# Patient Record
Sex: Male | Born: 1950 | Race: Black or African American | Hispanic: No | Marital: Married | Smoking: Never smoker
Health system: Southern US, Community
[De-identification: ages and names within clinical notes are randomized; demographics above are authoritative.]

## PROBLEM LIST (undated history)

## (undated) DIAGNOSIS — K5790 Diverticulosis of intestine, part unspecified, without perforation or abscess without bleeding: Secondary | ICD-10-CM

## (undated) DIAGNOSIS — R972 Elevated prostate specific antigen [PSA]: Secondary | ICD-10-CM

## (undated) DIAGNOSIS — D126 Benign neoplasm of colon, unspecified: Secondary | ICD-10-CM

## (undated) DIAGNOSIS — E785 Hyperlipidemia, unspecified: Secondary | ICD-10-CM

## (undated) DIAGNOSIS — G4730 Sleep apnea, unspecified: Secondary | ICD-10-CM

## (undated) DIAGNOSIS — K648 Other hemorrhoids: Secondary | ICD-10-CM

## (undated) DIAGNOSIS — N189 Chronic kidney disease, unspecified: Secondary | ICD-10-CM

## (undated) DIAGNOSIS — E119 Type 2 diabetes mellitus without complications: Secondary | ICD-10-CM

## (undated) DIAGNOSIS — I10 Essential (primary) hypertension: Secondary | ICD-10-CM

## (undated) HISTORY — PX: PROSTATE BIOPSY: SHX241

## (undated) HISTORY — DX: Essential (primary) hypertension: I10

## (undated) HISTORY — DX: Diverticulosis of intestine, part unspecified, without perforation or abscess without bleeding: K57.90

## (undated) HISTORY — DX: Benign neoplasm of colon, unspecified: D12.6

## (undated) HISTORY — PX: POLYPECTOMY: SHX149

## (undated) HISTORY — DX: Other hemorrhoids: K64.8

## (undated) HISTORY — DX: Type 2 diabetes mellitus without complications: E11.9

## (undated) HISTORY — DX: Hyperlipidemia, unspecified: E78.5

## (undated) HISTORY — PX: COLONOSCOPY: SHX174

## (undated) HISTORY — PX: OTHER SURGICAL HISTORY: SHX169

---

## 2007-06-12 ENCOUNTER — Ambulatory Visit: Payer: Self-pay | Admitting: Gastroenterology

## 2007-06-28 ENCOUNTER — Encounter: Payer: Self-pay | Admitting: Gastroenterology

## 2007-06-28 ENCOUNTER — Ambulatory Visit: Payer: Self-pay | Admitting: Gastroenterology

## 2007-06-30 ENCOUNTER — Encounter: Payer: Self-pay | Admitting: Gastroenterology

## 2010-07-22 ENCOUNTER — Encounter: Payer: Self-pay | Admitting: Gastroenterology

## 2010-07-29 ENCOUNTER — Ambulatory Visit (AMBULATORY_SURGERY_CENTER): Payer: 59 | Admitting: *Deleted

## 2010-07-29 VITALS — Ht 66.0 in | Wt 218.0 lb

## 2010-07-29 DIAGNOSIS — Z1211 Encounter for screening for malignant neoplasm of colon: Secondary | ICD-10-CM

## 2010-07-29 MED ORDER — PEG-KCL-NACL-NASULF-NA ASC-C 100 G PO SOLR: ORAL | Status: DC

## 2010-07-30 ENCOUNTER — Encounter: Payer: Self-pay | Admitting: Gastroenterology

## 2010-08-10 ENCOUNTER — Encounter: Payer: Self-pay | Admitting: Gastroenterology

## 2010-08-10 ENCOUNTER — Ambulatory Visit (AMBULATORY_SURGERY_CENTER): Payer: 59 | Admitting: Gastroenterology

## 2010-08-10 DIAGNOSIS — K5730 Diverticulosis of large intestine without perforation or abscess without bleeding: Secondary | ICD-10-CM

## 2010-08-10 DIAGNOSIS — Z1211 Encounter for screening for malignant neoplasm of colon: Secondary | ICD-10-CM

## 2010-08-10 DIAGNOSIS — Z86010 Personal history of colonic polyps: Secondary | ICD-10-CM

## 2010-08-10 MED ORDER — SODIUM CHLORIDE 0.9 % IV SOLN: 500.0000 mL | INTRAVENOUS | Status: DC

## 2010-08-10 NOTE — Patient Instructions (Signed)
Resume all medications. Information given on diverticulosis and high fiber diet.D/C Information completed.

## 2010-08-11 ENCOUNTER — Telehealth: Payer: Self-pay

## 2010-08-11 NOTE — Telephone Encounter (Signed)

## 2012-04-24 ENCOUNTER — Other Ambulatory Visit: Payer: Self-pay | Admitting: *Deleted

## 2012-04-24 ENCOUNTER — Encounter: Payer: Self-pay | Admitting: *Deleted

## 2012-04-24 DIAGNOSIS — I10 Essential (primary) hypertension: Secondary | ICD-10-CM

## 2012-04-24 NOTE — Telephone Encounter (Signed)
LAST OV 10/13 

## 2012-04-25 MED ORDER — AMLODIPINE BESYLATE 5 MG PO TABS: 5.0000 mg | ORAL_TABLET | Freq: Every day | ORAL | Status: DC

## 2012-04-25 NOTE — Telephone Encounter (Signed)
Refill once. Past due for office visit.

## 2012-05-01 ENCOUNTER — Ambulatory Visit: Payer: Self-pay | Admitting: Family Medicine

## 2012-06-08 ENCOUNTER — Ambulatory Visit (INDEPENDENT_AMBULATORY_CARE_PROVIDER_SITE_OTHER): Payer: 59 | Admitting: Family Medicine

## 2012-06-08 ENCOUNTER — Encounter: Payer: Self-pay | Admitting: Family Medicine

## 2012-06-08 VITALS — BP 134/86 | HR 93 | Temp 98.1°F | Wt 222.2 lb

## 2012-06-08 DIAGNOSIS — E8881 Metabolic syndrome: Secondary | ICD-10-CM

## 2012-06-08 DIAGNOSIS — I10 Essential (primary) hypertension: Secondary | ICD-10-CM

## 2012-06-08 DIAGNOSIS — E785 Hyperlipidemia, unspecified: Secondary | ICD-10-CM

## 2012-06-08 DIAGNOSIS — E119 Type 2 diabetes mellitus without complications: Secondary | ICD-10-CM

## 2012-06-08 LAB — BASIC METABOLIC PANEL WITH GFR
BUN: 13 mg/dL (ref 6–23)
CO2: 28 mEq/L (ref 19–32)
Calcium: 10.1 mg/dL (ref 8.4–10.5)
Chloride: 103 mEq/L (ref 96–112)
Creat: 1.34 mg/dL (ref 0.50–1.35)
GFR, Est African American: 67 mL/min
GFR, Est Non African American: 58 mL/min — ABNORMAL LOW
Glucose, Bld: 93 mg/dL (ref 70–99)
Potassium: 3.9 mEq/L (ref 3.5–5.3)
Sodium: 141 mEq/L (ref 135–145)

## 2012-06-08 LAB — HEPATIC FUNCTION PANEL
ALT: 23 U/L (ref 0–53)
AST: 27 U/L (ref 0–37)
Albumin: 4.3 g/dL (ref 3.5–5.2)
Alkaline Phosphatase: 84 U/L (ref 39–117)
Bilirubin, Direct: 0.2 mg/dL (ref 0.0–0.3)
Indirect Bilirubin: 0.6 mg/dL (ref 0.0–0.9)
Total Bilirubin: 0.8 mg/dL (ref 0.3–1.2)
Total Protein: 7.6 g/dL (ref 6.0–8.3)

## 2012-06-08 LAB — POCT UA - MICROALBUMIN: Microalbumin Ur, POC: POSITIVE mg/L

## 2012-06-08 LAB — POCT GLYCOSYLATED HEMOGLOBIN (HGB A1C): Hemoglobin A1C: 6.1

## 2012-06-08 MED ORDER — ATORVASTATIN CALCIUM 40 MG PO TABS: 40.0000 mg | ORAL_TABLET | Freq: Every day | ORAL | Status: DC

## 2012-06-08 MED ORDER — AMLODIPINE BESYLATE 5 MG PO TABS: 5.0000 mg | ORAL_TABLET | Freq: Every day | ORAL | Status: DC

## 2012-06-08 MED ORDER — HYDROCHLOROTHIAZIDE 25 MG PO TABS: 25.0000 mg | ORAL_TABLET | Freq: Every day | ORAL | Status: DC

## 2012-06-08 MED ORDER — SITAGLIPTIN PHOSPHATE 100 MG PO TABS: 50.0000 mg | ORAL_TABLET | Freq: Every day | ORAL | Status: DC

## 2012-06-08 NOTE — Progress Notes (Signed)
Patient ID: Paul Williams, male   DOB: December 05, 1950, 62 y.o.   MRN: [id number] SUBJECTIVE: Chief Complaint  Patient presents with  . Follow-up    6 month      HPI: Patient is here for follow up of Diabetes Mellitus/hypertension/hyperlipidemia: Symptoms of DM: Denies Nocturia ,Denies Urinary Frequency , denies Blurred vision ,deniesDizziness,denies.Dysuria,denies paresthesias, denies extremity pain or ulcers.Marland Kitchendenies chest pain. has had an annual eye exam. do check the feet. Does check CBGs. Average CBG:100-107 Denies episodes of hypoglycemia. Does have an emergency hypoglycemic plan. admits toCompliance with medications. Denies Problems with medications.  Breakfast: oatmeal sometimes . Not big on Breakfast Lunch:sandwich Supper: wife is a diabetic. A variety of foods.  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  Allergies: reviewed/updated in Epic  Medications: reviewed/updated in Epic  Immunizations: reviewed/updated in Epic  ROS: As above in the HPI. All other systems are stable or negative.  OBJECTIVE: APPEARANCE:  Patient in no acute distress.The patient appeared well nourished and normally developed. Acyanotic. Waist:49 inches VITAL SIGNS:BP 134/86  Pulse 93  Temp(Src) 98.1 F (36.7 C) (Oral)  Wt 222 lb 3.2 oz (100.789 kg)  BMI 34.79 kg/m2   SKIN: warm and  Dry without overt rashes, tattoos and scars  HEAD and Neck: without JVD, Head and scalp: normal Eyes:No scleral icterus. Fundi normal, eye movements normal. Ears: Auricle normal, canal normal, Tympanic membranes normal, insufflation normal. Nose: normal Throat: normal Neck & thyroid: normal  CHEST & LUNGS: Chest wall: normal Lungs: Clear  CVS: Reveals the PMI to be normally located. Regular rhythm, First and Second Heart sounds are normal,  absence of murmurs, rubs or gallops. Peripheral vasculature: Radial pulses: normal Dorsal pedis pulses:  normal Posterior pulses: normal  ABDOMEN:  Appearance:obese Benign, no organomegaly, no masses, no Abdominal Aortic enlargement. No Guarding , no rebound. No Bruits. Bowel sounds: normal  RECTAL: N/A GU: N/A  EXTREMETIES: nonedematous. Both Femoral and Pedal pulses are normal.  MUSCULOSKELETAL:  Spine: normal Joints: intact  NEUROLOGIC: oriented to time,place and person; nonfocal. Strength is normal Sensory is normal Reflexes are normal Cranial Nerves are normal.  ASSESSMENT: HTN (hypertension) - Plan: hydrochlorothiazide (HYDRODIURIL) 25 MG tablet, amLODipine (NORVASC) 5 MG tablet, BASIC METABOLIC PANEL WITH GFR  HLD (hyperlipidemia) - Plan: atorvastatin (LIPITOR) 40 MG tablet, Hepatic function panel, NMR Lipoprofile with Lipids  DM (diabetes mellitus) - Plan: sitaGLIPtin (JANUVIA) 100 MG tablet, POCT glycosylated hemoglobin (Hb A1C), POCT UA - Microalbumin, Microalbumin, urine  Metabolic syndrome  PLAN:      Dr Woodroe Mode Recommendations  Diet and Exercise discussed with patient.  For nutrition information, I recommend books:  1).Eat to Live by Dr Monico Hoar. 2).Prevent and Reverse Heart Disease by Dr Suzzette Righter. 3) Dr Katherina Right Book: Reversing Diabetes  Exercise recommendations are:  If unable to walk, then the patient can exercise in a chair 3 times a day. By flapping arms like a bird gently and raising legs outwards to the front.  If ambulatory, the patient can go for walks for 30 minutes 3 times a week. Then increase the intensity and duration as tolerated.  Goal is to try to attain exercise frequency to 5 times a week.  If applicable: Best to perform resistance exercises (machines or weights) 2 days a week and cardio type exercises 3 days per week.  Handouts on DM foot care in the AVS.  Orders Placed This Encounter  Procedures  . BASIC METABOLIC PANEL WITH GFR  . Hepatic  function panel  . NMR Lipoprofile with Lipids  .  Microalbumin, urine  . POCT glycosylated hemoglobin (Hb A1C)  . POCT UA - Microalbumin   Meds ordered this encounter  Medications  . DISCONTD: JANUVIA 100 MG tablet    Sig: Take 100 mg by mouth daily.   Marland Kitchen atorvastatin (LIPITOR) 40 MG tablet    Sig: Take 1 tablet (40 mg total) by mouth daily.    Dispense:  30 tablet    Refill:  6  . hydrochlorothiazide (HYDRODIURIL) 25 MG tablet    Sig: Take 1 tablet (25 mg total) by mouth daily.    Dispense:  30 tablet    Refill:  6  . amLODipine (NORVASC) 5 MG tablet    Sig: Take 1 tablet (5 mg total) by mouth daily.    Dispense:  30 tablet    Refill:  6    Due for office visit  . sitaGLIPtin (JANUVIA) 100 MG tablet    Sig: Take 0.5 tablets (50 mg total) by mouth daily.    Dispense:  30 tablet    Refill:  6   Return in about 3 months (around 09/08/2012) for Recheck medical problems.   Maxemiliano Riel P. Modesto Charon, M.D.

## 2012-06-08 NOTE — Patient Instructions (Addendum)
    Dr Marliss Buttacavoli's Recommendations  Diet and Exercise discussed with patient.  For nutrition information, I recommend books:  1).Eat to Live by Dr Joel Fuhrman. 2).Prevent and Reverse Heart Disease by Dr Caldwell Esselstyn. 3) Dr Neal Barnard's Book: Reversing Diabetes  Exercise recommendations are:  If unable to walk, then the patient can exercise in a chair 3 times a day. By flapping arms like a bird gently and raising legs outwards to the front.  If ambulatory, the patient can go for walks for 30 minutes 3 times a week. Then increase the intensity and duration as tolerated.  Goal is to try to attain exercise frequency to 5 times a week.  If applicable: Best to perform resistance exercises (machines or weights) 2 days a week and cardio type exercises 3 days per week.   Diabetes and Foot Care Diabetes may cause you to have a poor blood supply (circulation) to your legs and feet. Because of this, the skin may be thinner, break easier, and heal more slowly. You also may have nerve damage in your legs and feet causing decreased feeling. You may not notice minor injuries to your feet that could lead to serious problems or infections. Taking care of your feet is one of the most important things you can do for yourself.  HOME CARE INSTRUCTIONS  Do not go barefoot. Bare feet are easily injured.  Check your feet daily for blisters, cuts, and redness.  Wash your feet with warm water (not hot) and mild soap. Pat your feet and between your toes until completely dry.  Apply a moisturizing lotion that does not contain alcohol or petroleum jelly to the dry skin on your feet and to dry brittle toenails. Do not put it between your toes.  Trim your toenails straight across. Do not dig under them or around the cuticle.  Do not cut corns or calluses, or try to remove them with medicine.  Wear clean cotton socks or stockings every day. Make sure they are not too tight. Do not wear knee high  stockings since they may decrease blood flow to your legs.  Wear leather shoes that fit properly and have enough cushioning. To break in new shoes, wear them just a few hours a day to avoid injuring your feet.  Wear shoes at all times, even in the house.  Do not cross your legs. This may decrease the blood flow to your feet.  If you find a minor scrape, cut, or break in the skin on your feet, keep it and the skin around it clean and dry. These areas may be cleansed with mild soap and water. Do not use peroxide, alcohol, iodine or Merthiolate.  When you remove an adhesive bandage, be sure not to harm the skin around it.  If you have a wound, look at it several times a day to make sure it is healing.  Do not use heating pads or hot water bottles. Burns can occur. If you have lost feeling in your feet or legs, you may not know it is happening until it is too late.  Report any cuts, sores or bruises to your caregiver. Do not wait! SEEK MEDICAL CARE IF:   You have an injury that is not healing or you notice redness, numbness, burning, or tingling.  Your feet always feel cold.  You have pain or cramps in your legs and feet. SEEK IMMEDIATE MEDICAL CARE IF:   There is increasing redness, swelling, or increasing pain   in the wound.  There is a red line that goes up your leg.  Pus is coming from a wound.  You develop an unexplained oral temperature above 102 F (38.9 C), or as your caregiver suggests.  You notice a bad smell coming from an ulcer or wound. MAKE SURE YOU:   Understand these instructions.  Will watch your condition.  Will get help right away if you are not doing well or get worse. Document Released: 12/19/1999 Document Revised: 03/15/2011 Document Reviewed: 06/26/2008 ExitCare Patient Information 2014 [employer].  

## 2012-06-09 LAB — MICROALBUMIN, URINE: Microalb, Ur: 0.64 mg/dL (ref 0.00–1.89)

## 2012-06-09 LAB — NMR LIPOPROFILE WITH LIPIDS
Cholesterol, Total: 100 mg/dL (ref <200)
HDL Particle Number: 40.6 umol/L (ref >=30.5)
HDL Size: 9.1 nm — ABNORMAL LOW (ref >=9.2)
HDL-C: 50 mg/dL (ref >=40)
LDL (calc): 38 mg/dL (ref <100)
LDL Particle Number: 601 nmol/L (ref <1000)
LDL Size: 20.2 nm — ABNORMAL LOW (ref >20.5)
LP-IR Score: 52 — ABNORMAL HIGH (ref <=45)
Large HDL-P: 5.2 umol/L (ref >=4.8)
Large VLDL-P: 1.8 nmol/L (ref <=2.7)
Small LDL Particle Number: 373 nmol/L (ref <=527)
Triglycerides: 60 mg/dL (ref <150)
VLDL Size: 50.6 nm — ABNORMAL HIGH (ref <=46.6)

## 2012-07-11 ENCOUNTER — Ambulatory Visit (INDEPENDENT_AMBULATORY_CARE_PROVIDER_SITE_OTHER): Payer: 59 | Admitting: Pharmacist Clinician (PhC)/ Clinical Pharmacy Specialist

## 2012-07-11 DIAGNOSIS — E785 Hyperlipidemia, unspecified: Secondary | ICD-10-CM

## 2012-07-11 DIAGNOSIS — I10 Essential (primary) hypertension: Secondary | ICD-10-CM

## 2012-07-11 MED ORDER — LISINOPRIL-HYDROCHLOROTHIAZIDE 20-12.5 MG PO TABS: 1.0000 | ORAL_TABLET | Freq: Every day | ORAL | Status: DC

## 2012-07-11 NOTE — Progress Notes (Signed)
  Subjective:    Patient ID: Paul Williams, male    DOB: 05/19/1950, 62 y.o.   MRN: [id number]  HPI:  New onset hyperlipidemia treated with lipitor 40mg .  Also newly diagnosed with type 2 diabetes.    Review of Systems  Constitutional: Negative.   HENT: Negative.   Eyes: Negative.   Respiratory: Negative.   Cardiovascular: Negative.   Endocrine: Negative.   Allergic/Immunologic: Negative.   Neurological: Negative.   Hematological: Negative.   Psychiatric/Behavioral: Negative.        Objective:   Physical Exam  Constitutional: He is oriented to person, place, and time. He appears well-developed and well-nourished.  Cardiovascular: Normal rate, regular rhythm and normal heart sounds.   Neurological: He is alert and oriented to person, place, and time.  Skin: Skin is warm and dry.  Psychiatric: He has a normal mood and affect. His behavior is normal. Judgment and thought content normal.           Assessment & Plan:   Lipid Clinic Consultation  Chief Complaint:  No chief complaint on file.    Exam Regularity:  RRR Edema:  neg Respirations:  18    BP 130/72 Carotid Bruits:  neg Xanthomas:  neg General Appearance:  alert, oriented, no acute distress Mood/Affect:  normal  HPI  Patient told his cholesterol was "too low"     Component Value Date/Time   TRIG 60 06/08/2012 1731    Assessment: CHD/CHF Risk Equivalents:  type 2 DM [place] Estd 79yrs risk:  Not calculated NCEP Risk Factors Present:  family history Primary Problem(s):  LDL or LDL-P elevated previous to lipitor treatment Patient would benefit from ACEI:  Stopped amlodipine and HCTZ and changed patient to lisinopril-HCT 20-12.5 mg qd and check BMP in 4 weeks  Current NCEP Goals: LDL Goal < 100 HDL Goal >/= 40 Tg Goal < 409 Non-HDL Goal < 130  Secondary cause of hyperlipidemia present:  Type 2 DM Low fat diet followed?  Yes -   Low carb diet followed?  Yes -   Exercise?  Yes -     Recommendations: Changes in lipid medication(s):  Decrease lipitor to 20mg  qd (1/2 tablet of 40mg ) Recheck Lipid Panel:  12 weeks Other labs needed:  BMP in 4 weeks  Time spent counseling patient:   Physician time spent with patient:  0 Referring Provider:  Modesto Charon   PharmD:  [facility] Pharmacist

## 2012-08-10 ENCOUNTER — Other Ambulatory Visit (INDEPENDENT_AMBULATORY_CARE_PROVIDER_SITE_OTHER): Payer: 59

## 2012-08-10 ENCOUNTER — Ambulatory Visit: Payer: 59 | Admitting: *Deleted

## 2012-08-10 VITALS — BP 132/87 | HR 72

## 2012-08-10 DIAGNOSIS — I10 Essential (primary) hypertension: Secondary | ICD-10-CM

## 2012-08-10 NOTE — Progress Notes (Signed)
Patient reports that home readings have been running around 118/75.

## 2012-08-11 LAB — BASIC METABOLIC PANEL
BUN/Creatinine Ratio: 8 — ABNORMAL LOW (ref 10–22)
CO2: 24 mmol/L (ref 18–29)
Chloride: 104 mmol/L (ref 97–108)
GFR calc Af Amer: 73 mL/min/{1.73_m2} (ref >59)
Glucose: 106 mg/dL — ABNORMAL HIGH (ref 65–99)
Potassium: 4.2 mmol/L (ref 3.5–5.2)

## 2012-08-11 NOTE — Progress Notes (Signed)
Patient came in for labs only.

## 2012-08-22 ENCOUNTER — Telehealth: Payer: Self-pay | Admitting: Pharmacist Clinician (PhC)/ Clinical Pharmacy Specialist

## 2012-08-22 NOTE — Telephone Encounter (Signed)
Called patient to notify him of results.

## 2012-09-08 ENCOUNTER — Ambulatory Visit (INDEPENDENT_AMBULATORY_CARE_PROVIDER_SITE_OTHER): Payer: 59 | Admitting: Family Medicine

## 2012-09-08 ENCOUNTER — Encounter: Payer: Self-pay | Admitting: Family Medicine

## 2012-09-08 VITALS — BP 116/76 | HR 75 | Temp 97.8°F | Wt 221.6 lb

## 2012-09-08 DIAGNOSIS — E1159 Type 2 diabetes mellitus with other circulatory complications: Secondary | ICD-10-CM | POA: Insufficient documentation

## 2012-09-08 DIAGNOSIS — I10 Essential (primary) hypertension: Secondary | ICD-10-CM

## 2012-09-08 DIAGNOSIS — E119 Type 2 diabetes mellitus without complications: Secondary | ICD-10-CM

## 2012-09-08 DIAGNOSIS — I152 Hypertension secondary to endocrine disorders: Secondary | ICD-10-CM | POA: Insufficient documentation

## 2012-09-08 DIAGNOSIS — E1122 Type 2 diabetes mellitus with diabetic chronic kidney disease: Secondary | ICD-10-CM | POA: Insufficient documentation

## 2012-09-08 DIAGNOSIS — E1169 Type 2 diabetes mellitus with other specified complication: Secondary | ICD-10-CM | POA: Insufficient documentation

## 2012-09-08 DIAGNOSIS — E785 Hyperlipidemia, unspecified: Secondary | ICD-10-CM

## 2012-09-08 LAB — POCT GLYCOSYLATED HEMOGLOBIN (HGB A1C): Hemoglobin A1C: 6.3

## 2012-09-08 NOTE — Progress Notes (Signed)
Patient ID: Paul Williams, male   DOB: [birth year], 62 y.o.   MRN: [id number] SUBJECTIVE: CC: Chief Complaint  Patient presents with  . Follow-up    3 month follow up  no complaints     HPI: Patient is here for follow up of Diabetes Mellitus: Symptoms evaluated: Denies Nocturia ,Denies Urinary Frequency , denies Blurred vision ,deniesDizziness,denies.Dysuria,denies paresthesias, denies extremity pain or ulcers.Marland Kitchendenies chest pain. has had an annual eye exam. do check the feet. Does check CBGs. Average UXL:KGMWNU Denies episodes of hypoglycemia. Does have an emergency hypoglycemic plan. admits toCompliance with medications. Denies Problems with medications.  Past Medical History  Diagnosis Date  . Hyperlipidemia   . Hypertension   . Adenomatous polyp of colon    Past Surgical History  Procedure Laterality Date  . Colonoscopy    . Top plate of teeth extracted     History   Social History  . Marital Status: Married    Spouse Name: N/A    Number of Children: N/A  . Years of Education: N/A   Occupational History  . Not on file.   Social History Main Topics  . Smoking status: Never Smoker   . Smokeless tobacco: Never Used  . Alcohol Use: Yes     Comment: not often  . Drug Use: No  . Sexual Activity: Not on file   Other Topics Concern  . Not on file   Social History Narrative  . No narrative on file   No family history on file. Current Outpatient Prescriptions on File Prior to Visit  Medication Sig Dispense Refill  . atorvastatin (LIPITOR) 40 MG tablet Take 1 tablet (40 mg total) by mouth daily.  30 tablet  6  . Cholecalciferol (VITAMIN D3) 5000 UNITS CAPS Take 1 capsule by mouth daily.        . fish oil-omega-3 fatty acids 1000 MG capsule Take 1 g by mouth daily.        Marland Kitchen lisinopril-hydrochlorothiazide (ZESTORETIC) 20-12.5 MG per tablet Take 1 tablet by mouth daily.  90 tablet  3  . Multiple Vitamins-Minerals (MULTIVITAMIN WITH MINERALS) tablet Take 1 tablet by  mouth daily.        Marland Kitchen OVER THE COUNTER MEDICATION Take 1 tablet by mouth daily. Prosvent       . sitaGLIPtin (JANUVIA) 100 MG tablet Take 0.5 tablets (50 mg total) by mouth daily.  30 tablet  6  . VIAGRA 100 MG tablet Take 100 mg by mouth as needed.        No current facility-administered medications on file prior to visit.   No Known Allergies  There is no immunization history on file for this patient. Prior to Admission medications   Medication Sig Start Date End Date Taking? Authorizing Provider  atorvastatin (LIPITOR) 40 MG tablet Take 1 tablet (40 mg total) by mouth daily. 06/08/12  Yes Ileana Ladd, MD  Cholecalciferol (VITAMIN D3) 5000 UNITS CAPS Take 1 capsule by mouth daily.     Yes Historical Provider, MD  fish oil-omega-3 fatty acids 1000 MG capsule Take 1 g by mouth daily.     Yes Historical Provider, MD  lisinopril-hydrochlorothiazide (ZESTORETIC) 20-12.5 MG per tablet Take 1 tablet by mouth daily. 07/11/12  Yes Chari Manning, RPH  Multiple Vitamins-Minerals (MULTIVITAMIN WITH MINERALS) tablet Take 1 tablet by mouth daily.     Yes Historical Provider, MD  OVER THE COUNTER MEDICATION Take 1 tablet by mouth daily. Prosvent    Yes Historical Provider, MD  sitaGLIPtin (JANUVIA)  100 MG tablet Take 0.5 tablets (50 mg total) by mouth daily. 06/08/12  Yes Ileana Ladd, MD  VIAGRA 100 MG tablet Take 100 mg by mouth as needed.  06/20/10  Yes Historical Provider, MD    ROS: As above in the HPI. All other systems are stable or negative.  OBJECTIVE: APPEARANCE:  Patient in no acute distress.The patient appeared well nourished and normally developed. Acyanotic. Waist: VITAL SIGNS:BP 116/76  Pulse 75  Temp(Src) 97.8 F (36.6 C) (Oral)  Wt 221 lb 9.6 oz (100.517 kg)  BMI 34.7 kg/m2 [name]  SKIN: warm and  Dry without overt rashes, tattoos and scars  HEAD and Neck: without JVD, Head and scalp: normal Eyes:No scleral icterus. Fundi normal, eye movements normal. Ears: Auricle  normal, canal normal, Tympanic membranes normal, insufflation normal. Nose: normal Throat: normal Neck & thyroid: normal  CHEST & LUNGS: Chest wall: normal Lungs: Clear  CVS: Reveals the PMI to be normally located. Regular rhythm, First and Second Heart sounds are normal,  absence of murmurs, rubs or gallops. Peripheral vasculature: Radial pulses: normal Dorsal pedis pulses: normal Posterior pulses: normal  ABDOMEN:  Appearance: obese Benign, no organomegaly, no masses, no Abdominal Aortic enlargement. No Guarding , no rebound. No Bruits. Bowel sounds: normal  RECTAL: N/A GU: N/A  EXTREMETIES: nonedematous.  MUSCULOSKELETAL:  Spine: normal Joints: intact  NEUROLOGIC: oriented to time,place and person; nonfocal. Strength is normal Sensory is normal Reflexes are normal Cranial Nerves are normal.  ASSESSMENT: Hypertension - Plan: CMP14+EGFR  Hyperlipidemia - Plan: CMP14+EGFR, NMR, lipoprofile  Diabetes mellitus without complication - Plan: POCT glycosylated hemoglobin (Hb A1C), CMP14+EGFR  PLAN: Orders Placed This Encounter  Procedures  . CMP14+EGFR  . NMR, lipoprofile  . POCT glycosylated hemoglobin (Hb A1C)   No change in medications. Await the lab results.  counselled on the stresses he is facing at home with his daughter and granddaughter.  Return in about 3 months (around 12/08/2012) for Recheck medical problems.  Abagale Boulos P. Modesto Charon, M.D.

## 2012-09-10 LAB — NMR, LIPOPROFILE
Cholesterol: 114 mg/dL (ref <200)
HDL Cholesterol by NMR: 56 mg/dL (ref >=40)
HDL Particle Number: 39.3 umol/L (ref >=30.5)
LDL Particle Number: 562 nmol/L (ref <1000)
LDL Size: 21 nm (ref >20.5)
LDLC SERPL CALC-MCNC: 43 mg/dL (ref <100)
LP-IR Score: 41 (ref <=45)
Small LDL Particle Number: 275 nmol/L (ref <=527)
Triglycerides by NMR: 74 mg/dL (ref <150)

## 2012-09-10 LAB — CMP14+EGFR
ALT: 22 IU/L (ref 0–44)
AST: 26 IU/L (ref 0–40)
Albumin/Globulin Ratio: 1.8 (ref 1.1–2.5)
Albumin: 4.4 g/dL (ref 3.6–4.8)
Alkaline Phosphatase: 68 IU/L (ref 39–117)
BUN/Creatinine Ratio: 9 — ABNORMAL LOW (ref 10–22)
BUN: 12 mg/dL (ref 8–27)
CO2: 25 mmol/L (ref 18–29)
Calcium: 10.2 mg/dL (ref 8.6–10.2)
Chloride: 101 mmol/L (ref 97–108)
Creatinine, Ser: 1.4 mg/dL — ABNORMAL HIGH (ref 0.76–1.27)
GFR calc Af Amer: 63 mL/min/{1.73_m2} (ref >59)
GFR calc non Af Amer: 54 mL/min/{1.73_m2} — ABNORMAL LOW (ref >59)
Globulin, Total: 2.5 g/dL (ref 1.5–4.5)
Glucose: 95 mg/dL (ref 65–99)
Potassium: 4.7 mmol/L (ref 3.5–5.2)
Sodium: 142 mmol/L (ref 134–144)
Total Bilirubin: 0.6 mg/dL (ref 0.0–1.2)
Total Protein: 6.9 g/dL (ref 6.0–8.5)

## 2012-12-08 ENCOUNTER — Encounter (INDEPENDENT_AMBULATORY_CARE_PROVIDER_SITE_OTHER): Payer: Self-pay

## 2012-12-08 ENCOUNTER — Ambulatory Visit (INDEPENDENT_AMBULATORY_CARE_PROVIDER_SITE_OTHER): Payer: 59 | Admitting: Family Medicine

## 2012-12-08 ENCOUNTER — Encounter: Payer: Self-pay | Admitting: Family Medicine

## 2012-12-08 VITALS — BP 128/81 | HR 88 | Temp 99.0°F | Ht 64.5 in | Wt 221.8 lb

## 2012-12-08 DIAGNOSIS — I10 Essential (primary) hypertension: Secondary | ICD-10-CM

## 2012-12-08 DIAGNOSIS — E785 Hyperlipidemia, unspecified: Secondary | ICD-10-CM

## 2012-12-08 DIAGNOSIS — E119 Type 2 diabetes mellitus without complications: Secondary | ICD-10-CM

## 2012-12-08 DIAGNOSIS — E559 Vitamin D deficiency, unspecified: Secondary | ICD-10-CM

## 2012-12-08 NOTE — Progress Notes (Signed)
Patient ID: Paul Williams, male   DOB: 1950/06/16, 62 y.o.   MRN: [id number] SUBJECTIVE: CC: Chief Complaint  Patient presents with  . Follow-up    3 month follow up diabetes     HPI: Patient is here for follow up of Diabetes Mellitus/htn/hld: Symptoms evaluated: Denies Nocturia ,Denies Urinary Frequency , denies Blurred vision ,deniesDizziness,denies.Dysuria,denies paresthesias, denies extremity pain or ulcers.Marland Kitchendenies chest pain. has had an annual eye exam. do check the feet. Does check CBGs. Average QMV:HQIONG Denies episodes of hypoglycemia. Does have an emergency hypoglycemic plan. admits toCompliance with medications. Denies Problems with medications.   Past Medical History  Diagnosis Date  . Adenomatous polyp of colon   . Hyperlipidemia   . Hypertension   . Diabetes mellitus without complication    Past Surgical History  Procedure Laterality Date  . Colonoscopy    . Top plate of teeth extracted     History   Social History  . Marital Status: Married    Spouse Name: N/A    Number of Children: N/A  . Years of Education: N/A   Occupational History  . Not on file.   Social History Main Topics  . Smoking status: Never Smoker   . Smokeless tobacco: Never Used  . Alcohol Use: Yes     Comment: not often  . Drug Use: No  . Sexual Activity: Not on file   Other Topics Concern  . Not on file   Social History Narrative  . No narrative on file   No family history on file. Current Outpatient Prescriptions on File Prior to Visit  Medication Sig Dispense Refill  . atorvastatin (LIPITOR) 40 MG tablet Take 1 tablet (40 mg total) by mouth daily.  30 tablet  6  . Cholecalciferol (VITAMIN D3) 5000 UNITS CAPS Take 1 capsule by mouth daily.        . fish oil-omega-3 fatty acids 1000 MG capsule Take 1 g by mouth daily.        Marland Kitchen lisinopril-hydrochlorothiazide (ZESTORETIC) 20-12.5 MG per tablet Take 1 tablet by mouth daily.  90 tablet  3  . Multiple Vitamins-Minerals  (MULTIVITAMIN WITH MINERALS) tablet Take 1 tablet by mouth daily.        Marland Kitchen OVER THE COUNTER MEDICATION Take 1 tablet by mouth daily. Prosvent       . sitaGLIPtin (JANUVIA) 100 MG tablet Take 0.5 tablets (50 mg total) by mouth daily.  30 tablet  6  . VIAGRA 100 MG tablet Take 100 mg by mouth as needed.        No current facility-administered medications on file prior to visit.   No Known Allergies  There is no immunization history on file for this patient. Prior to Admission medications   Medication Sig Start Date End Date Taking? Authorizing Provider  atorvastatin (LIPITOR) 40 MG tablet Take 1 tablet (40 mg total) by mouth daily. 06/08/12  Yes Ileana Ladd, MD  Cholecalciferol (VITAMIN D3) 5000 UNITS CAPS Take 1 capsule by mouth daily.     Yes Historical Provider, MD  fish oil-omega-3 fatty acids 1000 MG capsule Take 1 g by mouth daily.     Yes Historical Provider, MD  lisinopril-hydrochlorothiazide (ZESTORETIC) 20-12.5 MG per tablet Take 1 tablet by mouth daily. 07/11/12  Yes Chari Manning, RPH  Multiple Vitamins-Minerals (MULTIVITAMIN WITH MINERALS) tablet Take 1 tablet by mouth daily.     Yes Historical Provider, MD  OVER THE COUNTER MEDICATION Take 1 tablet by mouth daily. Prosvent    Yes  Historical Provider, MD  sitaGLIPtin (JANUVIA) 100 MG tablet Take 0.5 tablets (50 mg total) by mouth daily. 06/08/12  Yes Ileana Ladd, MD  VIAGRA 100 MG tablet Take 100 mg by mouth as needed.  06/20/10  Yes Historical Provider, MD     ROS: As above in the HPI. All other systems are stable or negative.  OBJECTIVE: APPEARANCE:  Patient in no acute distress.The patient appeared well nourished and normally developed. Acyanotic. Waist: VITAL SIGNS:BP 128/81  Pulse 88  Temp(Src) 99 F (37.2 C) (Oral)  Ht 5' 4.5" (1.638 m)  Wt 221 lb 12.8 oz (100.608 kg)  BMI 37.50 kg/m2  Obese [name]  SKIN: warm and  Dry without overt rashes, tattoos and scars  HEAD and Neck: without JVD, Head and scalp:  normal Eyes:No scleral icterus. Fundi normal, eye movements normal. Ears: Auricle normal, canal normal, Tympanic membranes normal, insufflation normal. Nose: normal Throat: normal Neck & thyroid: normal  CHEST & LUNGS: Chest wall: normal Lungs: Clear  CVS: Reveals the PMI to be normally located. Regular rhythm, First and Second Heart sounds are normal,  absence of murmurs, rubs or gallops. Peripheral vasculature: Radial pulses: normal Dorsal pedis pulses: normal Posterior pulses: normal  ABDOMEN:  Appearance: normal Benign, no organomegaly, no masses, no Abdominal Aortic enlargement. No Guarding , no rebound. No Bruits. Bowel sounds: normal  RECTAL: N/A GU: N/A  EXTREMETIES: nonedematous.  MUSCULOSKELETAL:  Spine: normal Joints: intact  NEUROLOGIC: oriented to time,place and person; nonfocal. Strength is normal Sensory is normal Reflexes are normal Cranial Nerves are normal. Results for orders placed in visit on 09/08/12  CMP14+EGFR      Result Value Range   Glucose 95  65 - 99 mg/dL   BUN 12  8 - 27 mg/dL   Creatinine, Ser 9.60 (*) 0.76 - 1.27 mg/dL   GFR calc non Af Amer 54 (*) >59 mL/min/1.73   GFR calc Af Amer 63  >59 mL/min/1.73   BUN/Creatinine Ratio 9 (*) 10 - 22   Sodium 142  134 - 144 mmol/L   Potassium 4.7  3.5 - 5.2 mmol/L   Chloride 101  97 - 108 mmol/L   CO2 25  18 - 29 mmol/L   Calcium 10.2  8.6 - 10.2 mg/dL   Total Protein 6.9  6.0 - 8.5 g/dL   Albumin 4.4  3.6 - 4.8 g/dL   Globulin, Total 2.5  1.5 - 4.5 g/dL   Albumin/Globulin Ratio 1.8  1.1 - 2.5   Total Bilirubin 0.6  0.0 - 1.2 mg/dL   Alkaline Phosphatase 68  39 - 117 IU/L   AST 26  0 - 40 IU/L   ALT 22  0 - 44 IU/L  NMR, LIPOPROFILE      Result Value Range   LDL Particle Number 562  <1000 nmol/L   LDLC SERPL CALC-MCNC 43  <100 mg/dL   HDL Cholesterol by NMR 56  >=40 mg/dL   Triglycerides by NMR 74  <150 mg/dL   Cholesterol 454  <098 mg/dL   HDL Particle Number 11.9  >=14.7  umol/L   Small LDL Particle Number 275  <=527 nmol/L   LDL Size 21.0  >20.5 nm   LP-IR Score 41  <=45  POCT GLYCOSYLATED HEMOGLOBIN (HGB A1C)      Result Value Range   Hemoglobin A1C 6.3%      ASSESSMENT:  Diabetes mellitus without complication - Plan: POCT glycosylated hemoglobin (Hb A1C), CMP14+EGFR  Hyperlipidemia - Plan: CMP14+EGFR, NMR, lipoprofile  Hypertension  Unspecified vitamin D deficiency - Plan: Vit D  25 hydroxy (rtn osteoporosis monitoring)  PLAN:      Dr Woodroe Mode Recommendations  For nutrition information, I recommend books:  1).Eat to Live by Dr Monico Hoar. 2).Prevent and Reverse Heart Disease by Dr Suzzette Righter. 3) Dr Katherina Right Book:  Program to Reverse Diabetes  Exercise recommendations are:  If unable to walk, then the patient can exercise in a chair 3 times a day. By flapping arms like a bird gently and raising legs outwards to the front.  If ambulatory, the patient can go for walks for 30 minutes 3 times a week. Then increase the intensity and duration as tolerated.  Goal is to try to attain exercise frequency to 5 times a week.  If applicable: Best to perform resistance exercises (machines or weights) 2 days a week and cardio type exercises 3 days per week.  Orders Placed This Encounter  Procedures  . CMP14+EGFR  . NMR, lipoprofile  . Vit D  25 hydroxy (rtn osteoporosis monitoring)  . POCT glycosylated hemoglobin (Hb A1C)   No orders of the defined types were placed in this encounter.   There are no discontinued medications. Return in about 3 months (around 03/08/2013) for Recheck medical problems.  Jostin Rue P. Modesto Charon, M.D.

## 2012-12-08 NOTE — Patient Instructions (Signed)
      Dr Tarah Buboltz's Recommendations  For nutrition information, I recommend books:  1).Eat to Live by Dr Joel Fuhrman. 2).Prevent and Reverse Heart Disease by Dr Caldwell Esselstyn. 3) Dr Neal Barnard's Book:  Program to Reverse Diabetes  Exercise recommendations are:  If unable to walk, then the patient can exercise in a chair 3 times a day. By flapping arms like a bird gently and raising legs outwards to the front.  If ambulatory, the patient can go for walks for 30 minutes 3 times a week. Then increase the intensity and duration as tolerated.  Goal is to try to attain exercise frequency to 5 times a week.  If applicable: Best to perform resistance exercises (machines or weights) 2 days a week and cardio type exercises 3 days per week.  

## 2012-12-09 LAB — CMP14+EGFR
ALT: 21 IU/L (ref 0–44)
AST: 25 IU/L (ref 0–40)
Albumin/Globulin Ratio: 1.6 (ref 1.1–2.5)
Albumin: 4.4 g/dL (ref 3.6–4.8)
Alkaline Phosphatase: 71 IU/L (ref 39–117)
BUN/Creatinine Ratio: 8 — ABNORMAL LOW (ref 10–22)
BUN: 11 mg/dL (ref 8–27)
CO2: 23 mmol/L (ref 18–29)
Calcium: 10.2 mg/dL (ref 8.6–10.2)
Chloride: 102 mmol/L (ref 97–108)
Creatinine, Ser: 1.33 mg/dL — ABNORMAL HIGH (ref 0.76–1.27)
GFR calc Af Amer: 67 mL/min/{1.73_m2} (ref >59)
GFR calc non Af Amer: 58 mL/min/{1.73_m2} — ABNORMAL LOW (ref >59)
Globulin, Total: 2.7 g/dL (ref 1.5–4.5)
Glucose: 99 mg/dL (ref 65–99)
Potassium: 4.1 mmol/L (ref 3.5–5.2)
Sodium: 143 mmol/L (ref 134–144)
Total Bilirubin: 0.6 mg/dL (ref 0.0–1.2)
Total Protein: 7.1 g/dL (ref 6.0–8.5)

## 2012-12-09 LAB — NMR, LIPOPROFILE
Cholesterol: 120 mg/dL (ref <200)
HDL Cholesterol by NMR: 50 mg/dL (ref >=40)
HDL Particle Number: 41.1 umol/L (ref >=30.5)
LDL Particle Number: 732 nmol/L (ref <1000)
LDL Size: 20.3 nm — ABNORMAL LOW (ref >20.5)
LDLC SERPL CALC-MCNC: 51 mg/dL (ref <100)
LP-IR Score: 58 — ABNORMAL HIGH (ref <=45)
Small LDL Particle Number: 493 nmol/L (ref <=527)
Triglycerides by NMR: 93 mg/dL (ref <150)

## 2012-12-09 LAB — VITAMIN D 25 HYDROXY (VIT D DEFICIENCY, FRACTURES): Vit D, 25-Hydroxy: 36.2 ng/mL (ref 30.0–100.0)

## 2012-12-11 LAB — POCT GLYCOSYLATED HEMOGLOBIN (HGB A1C): Hemoglobin A1C: 6

## 2013-02-07 ENCOUNTER — Other Ambulatory Visit: Payer: Self-pay | Admitting: *Deleted

## 2013-02-07 MED ORDER — SILDENAFIL CITRATE 100 MG PO TABS
100.0000 mg | ORAL_TABLET | ORAL | Status: DC | PRN
Start: 2013-02-07 — End: 2013-03-13

## 2013-02-07 NOTE — Telephone Encounter (Signed)
Call patient : Prescription refilled & sent to pharmacy in [place]. 

## 2013-02-08 NOTE — Telephone Encounter (Signed)
Aware. 

## 2013-03-13 ENCOUNTER — Encounter: Payer: Self-pay | Admitting: Family Medicine

## 2013-03-13 ENCOUNTER — Ambulatory Visit (INDEPENDENT_AMBULATORY_CARE_PROVIDER_SITE_OTHER): Payer: 59 | Admitting: Family Medicine

## 2013-03-13 VITALS — BP 113/79 | HR 82 | Temp 98.9°F | Ht 66.0 in | Wt 226.8 lb

## 2013-03-13 DIAGNOSIS — E119 Type 2 diabetes mellitus without complications: Secondary | ICD-10-CM

## 2013-03-13 DIAGNOSIS — E785 Hyperlipidemia, unspecified: Secondary | ICD-10-CM

## 2013-03-13 DIAGNOSIS — I10 Essential (primary) hypertension: Secondary | ICD-10-CM

## 2013-03-13 DIAGNOSIS — E559 Vitamin D deficiency, unspecified: Secondary | ICD-10-CM

## 2013-03-13 LAB — POCT GLYCOSYLATED HEMOGLOBIN (HGB A1C): Hemoglobin A1C: 6

## 2013-03-13 MED ORDER — SILDENAFIL CITRATE 100 MG PO TABS: 100.0000 mg | ORAL_TABLET | ORAL | Status: DC | PRN

## 2013-03-13 MED ORDER — SITAGLIPTIN PHOSPHATE 100 MG PO TABS: 50.0000 mg | ORAL_TABLET | Freq: Every day | ORAL | Status: DC

## 2013-03-13 MED ORDER — ATORVASTATIN CALCIUM 40 MG PO TABS: 40.0000 mg | ORAL_TABLET | Freq: Every day | ORAL | Status: DC

## 2013-03-13 MED ORDER — LISINOPRIL-HYDROCHLOROTHIAZIDE 20-12.5 MG PO TABS: 1.0000 | ORAL_TABLET | Freq: Every day | ORAL | Status: DC

## 2013-03-13 NOTE — Progress Notes (Signed)
Patient ID: Paul Williams, male   DOB: [birth year], 63 y.o.   MRN: [id number] SUBJECTIVE: CC: Chief Complaint  Patient presents with  . Follow-up    3 MONTH FOLLOW UP     HPI:  Patient is here for follow up of Diabetes Mellitus/HTN/HLD/Obesity: Symptoms evaluated: Denies Nocturia ,Denies Urinary Frequency , denies Blurred vision ,deniesDizziness,denies.Dysuria,denies paresthesias, denies extremity pain or ulcers.Marland Kitchendenies chest pain. has had an annual eye exam. do check the feet. Does check CBGs. Average POE:UMPNTI. Denies episodes of hypoglycemia. Does have an emergency hypoglycemic plan. admits toCompliance with medications. Denies Problems with medications.   Past Medical History  Diagnosis Date  . Adenomatous polyp of colon   . Hyperlipidemia   . Hypertension   . Diabetes mellitus without complication    Past Surgical History  Procedure Laterality Date  . Colonoscopy    . Top plate of teeth extracted     History   Social History  . Marital Status: Married    Spouse Name: N/A    Number of Children: N/A  . Years of Education: N/A   Occupational History  . Not on file.   Social History Main Topics  . Smoking status: Never Smoker   . Smokeless tobacco: Never Used  . Alcohol Use: Yes     Comment: not often  . Drug Use: No  . Sexual Activity: Not on file   Other Topics Concern  . Not on file   Social History Narrative  . No narrative on file   No family history on file. Current Outpatient Prescriptions on File Prior to Visit  Medication Sig Dispense Refill  . Cholecalciferol (VITAMIN D3) 5000 UNITS CAPS Take 1 capsule by mouth daily.        . fish oil-omega-3 fatty acids 1000 MG capsule Take 1 g by mouth daily.        . Multiple Vitamins-Minerals (MULTIVITAMIN WITH MINERALS) tablet Take 1 tablet by mouth daily.        Marland Kitchen OVER THE COUNTER MEDICATION Take 1 tablet by mouth daily. Prosvent        No current facility-administered medications on file prior to  visit.   No Known Allergies  There is no immunization history on file for this patient. Prior to Admission medications   Medication Sig Start Date End Date Taking? Authorizing Provider  atorvastatin (LIPITOR) 40 MG tablet Take 1 tablet (40 mg total) by mouth daily. 06/08/12  Yes Vernie Shanks, MD  Cholecalciferol (VITAMIN D3) 5000 UNITS CAPS Take 1 capsule by mouth daily.     Yes Historical Provider, MD  fish oil-omega-3 fatty acids 1000 MG capsule Take 1 g by mouth daily.     Yes Historical Provider, MD  lisinopril-hydrochlorothiazide (ZESTORETIC) 20-12.5 MG per tablet Take 1 tablet by mouth daily. 07/11/12  Yes Memory Argue, RPH  Multiple Vitamins-Minerals (MULTIVITAMIN WITH MINERALS) tablet Take 1 tablet by mouth daily.     Yes Historical Provider, MD  OVER THE COUNTER MEDICATION Take 1 tablet by mouth daily. Prosvent    Yes Historical Provider, MD  sildenafil (VIAGRA) 100 MG tablet Take 1 tablet (100 mg total) by mouth as needed. 02/07/13  Yes Vernie Shanks, MD  sitaGLIPtin (JANUVIA) 100 MG tablet Take 0.5 tablets (50 mg total) by mouth daily. 06/08/12  Yes Vernie Shanks, MD     ROS: As above in the HPI. All other systems are stable or negative.  OBJECTIVE: APPEARANCE:  Patient in no acute distress.The patient appeared well nourished and  normally developed. Acyanotic. Waist: VITAL SIGNS:BP 113/79  Pulse 82  Temp(Src) 98.9 F (37.2 C) (Oral)  Ht $R'5\' 6"'PG$  (1.676 m)  Wt 226 lb 12.8 oz (102.876 kg)  BMI 36.62 kg/m2 [name] obese  SKIN: warm and  Dry without overt rashes, tattoos and scars  HEAD and Neck: without JVD, Head and scalp: normal Eyes:No scleral icterus. Fundi normal, eye movements normal. Ears: Auricle normal, canal normal, Tympanic membranes normal, insufflation normal. Nose: normal Throat: normal Neck & thyroid: normal  CHEST & LUNGS: Chest wall: normal Lungs: Clear  CVS: Reveals the PMI to be normally located. Regular rhythm, First and Second Heart sounds are  normal,  absence of murmurs, rubs or gallops. Peripheral vasculature: Radial pulses: normal Dorsal pedis pulses: normal Posterior pulses: normal  ABDOMEN:  Appearance: obesity Benign, no organomegaly, no masses, no Abdominal Aortic enlargement. No Guarding , no rebound. No Bruits. Bowel sounds: normal  RECTAL: N/A GU: N/A  EXTREMETIES: nonedematous.  MUSCULOSKELETAL:  Spine: normal Joints: intact  NEUROLOGIC: oriented to time,place and person; nonfocal. Strength is normal Sensory is normal Reflexes are normal Cranial Nerves are normal.  Results for orders placed in visit on 03/13/13  POCT GLYCOSYLATED HEMOGLOBIN (HGB A1C)      Result Value Ref Range   Hemoglobin A1C 6.0%      ASSESSMENT:  Hypertension - Plan: CMP14+EGFR  Hyperlipidemia - Plan: CMP14+EGFR, NMR, lipoprofile  Diabetes mellitus without complication - Plan: POCT glycosylated hemoglobin (Hb A1C), CMP14+EGFR  Unspecified vitamin D deficiency - Plan: Vit D  25 hydroxy (rtn osteoporosis monitoring)  Accelerated hypertension - Plan: lisinopril-hydrochlorothiazide (ZESTORETIC) 20-12.5 MG per tablet  DM (diabetes mellitus) - Plan: sitaGLIPtin (JANUVIA) 100 MG tablet  HLD (hyperlipidemia) - Plan: atorvastatin (LIPITOR) 40 MG tablet  PLAN:      Dr Paula Libra Recommendations  For nutrition information, I recommend books:  1).Eat to Live by Dr Excell Seltzer. 2).Prevent and Reverse Heart Disease by Dr Karl Luke. 3) Dr Janene Harvey Book:  Program to Reverse Diabetes  Exercise recommendations are:  If unable to walk, then the patient can exercise in a chair 3 times a day. By flapping arms like a bird gently and raising legs outwards to the front.  If ambulatory, the patient can go for walks for 30 minutes 3 times a week. Then increase the intensity and duration as tolerated.  Goal is to try to attain exercise frequency to 5 times a week.  If applicable: Best to perform resistance  exercises (machines or weights) 2 days a week and cardio type exercises 3 days per week.  DM foot care given in the AVS.  Orders Placed This Encounter  Procedures  . CMP14+EGFR  . NMR, lipoprofile  . Vit D  25 hydroxy (rtn osteoporosis monitoring)  . POCT glycosylated hemoglobin (Hb A1C)   Meds ordered this encounter  Medications  . lisinopril-hydrochlorothiazide (ZESTORETIC) 20-12.5 MG per tablet    Sig: Take 1 tablet by mouth daily.    Dispense:  90 tablet    Refill:  3  . sitaGLIPtin (JANUVIA) 100 MG tablet    Sig: Take 0.5 tablets (50 mg total) by mouth daily.    Dispense:  30 tablet    Refill:  6  . atorvastatin (LIPITOR) 40 MG tablet    Sig: Take 1 tablet (40 mg total) by mouth daily.    Dispense:  30 tablet    Refill:  6  . sildenafil (VIAGRA) 100 MG tablet    Sig: Take 1 tablet (100  mg total) by mouth as needed.    Dispense:  10 tablet    Refill:  3   Medications Discontinued During This Encounter  Medication Reason  . lisinopril-hydrochlorothiazide (ZESTORETIC) 20-12.5 MG per tablet Reorder  . sitaGLIPtin (JANUVIA) 100 MG tablet Reorder  . atorvastatin (LIPITOR) 40 MG tablet Reorder  . sildenafil (VIAGRA) 100 MG tablet Reorder   Return in about 3 months (around 06/13/2013) for Recheck medical problems.  Navneet Schmuck P. Jacelyn Grip, M.D.

## 2013-03-13 NOTE — Patient Instructions (Signed)
    Dr Shonnie Poudrier's Recommendations  For nutrition information, I recommend books:  1).Eat to Live by Dr Joel Fuhrman. 2).Prevent and Reverse Heart Disease by Dr Caldwell Esselstyn. 3) Dr Neal Barnard's Book:  Program to Reverse Diabetes  Exercise recommendations are:  If unable to walk, then the patient can exercise in a chair 3 times a day. By flapping arms like a bird gently and raising legs outwards to the front.  If ambulatory, the patient can go for walks for 30 minutes 3 times a week. Then increase the intensity and duration as tolerated.  Goal is to try to attain exercise frequency to 5 times a week.  If applicable: Best to perform resistance exercises (machines or weights) 2 days a week and cardio type exercises 3 days per week.   Diabetes and Foot Care Diabetes may cause you to have problems because of poor blood supply (circulation) to your feet and legs. This may cause the skin on your feet to become thinner, break easier, and heal more slowly. Your skin may become dry, and the skin may peel and crack. You may also have nerve damage in your legs and feet causing decreased feeling in them. You may not notice minor injuries to your feet that could lead to infections or more serious problems. Taking care of your feet is one of the most important things you can do for yourself.  HOME CARE INSTRUCTIONS  Wear shoes at all times, even in the house. Do not go barefoot. Bare feet are easily injured.  Check your feet daily for blisters, cuts, and redness. If you cannot see the bottom of your feet, use a mirror or ask someone for help.  Wash your feet with warm water (do not use hot water) and mild soap. Then pat your feet and the areas between your toes until they are completely dry. Do not soak your feet as this can dry your skin.  Apply a moisturizing lotion or petroleum jelly (that does not contain alcohol and is unscented) to the skin on your feet and to dry, brittle toenails.  Do not apply lotion between your toes.  Trim your toenails straight across. Do not dig under them or around the cuticle. File the edges of your nails with an emery board or nail file.  Do not cut corns or calluses or try to remove them with medicine.  Wear clean socks or stockings every day. Make sure they are not too tight. Do not wear knee-high stockings since they may decrease blood flow to your legs.  Wear shoes that fit properly and have enough cushioning. To break in new shoes, wear them for just a few hours a day. This prevents you from injuring your feet. Always look in your shoes before you put them on to be sure there are no objects inside.  Do not cross your legs. This may decrease the blood flow to your feet.  If you find a minor scrape, cut, or break in the skin on your feet, keep it and the skin around it clean and dry. These areas may be cleansed with mild soap and water. Do not cleanse the area with peroxide, alcohol, or iodine.  When you remove an adhesive bandage, be sure not to damage the skin around it.  If you have a wound, look at it several times a day to make sure it is healing.  Do not use heating pads or hot water bottles. They may burn your skin. If   you have lost feeling in your feet or legs, you may not know it is happening until it is too late.  Make sure your health care provider performs a complete foot exam at least annually or more often if you have foot problems. Report any cuts, sores, or bruises to your health care provider immediately. SEEK MEDICAL CARE IF:   You have an injury that is not healing.  You have cuts or breaks in the skin.  You have an ingrown nail.  You notice redness on your legs or feet.  You feel burning or tingling in your legs or feet.  You have pain or cramps in your legs and feet.  Your legs or feet are numb.  Your feet always feel cold. SEEK IMMEDIATE MEDICAL CARE IF:   There is increasing redness, swelling, or pain in  or around a wound.  There is a red line that goes up your leg.  Pus is coming from a wound.  You develop a fever or as directed by your health care provider.  You notice a bad smell coming from an ulcer or wound. Document Released: 12/19/1999 Document Revised: 08/23/2012 Document Reviewed: 05/30/2012 [employer] Patient Information 2014 [employer].  

## 2013-03-15 LAB — CMP14+EGFR
ALT: 26 IU/L (ref 0–44)
AST: 26 IU/L (ref 0–40)
Albumin/Globulin Ratio: 1.5 (ref 1.1–2.5)
Albumin: 4.2 g/dL (ref 3.6–4.8)
Alkaline Phosphatase: 74 IU/L (ref 39–117)
BUN/Creatinine Ratio: 8 — ABNORMAL LOW (ref 10–22)
BUN: 11 mg/dL (ref 8–27)
CO2: 23 mmol/L (ref 18–29)
Calcium: 10.1 mg/dL (ref 8.6–10.2)
Chloride: 105 mmol/L (ref 97–108)
Creatinine, Ser: 1.44 mg/dL — ABNORMAL HIGH (ref 0.76–1.27)
GFR calc Af Amer: 61 mL/min/{1.73_m2} (ref >59)
GFR calc non Af Amer: 52 mL/min/{1.73_m2} — ABNORMAL LOW (ref >59)
Globulin, Total: 2.8 g/dL (ref 1.5–4.5)
Glucose: 94 mg/dL (ref 65–99)
Potassium: 4.5 mmol/L (ref 3.5–5.2)
Sodium: 144 mmol/L (ref 134–144)
Total Bilirubin: 0.4 mg/dL (ref 0.0–1.2)
Total Protein: 7 g/dL (ref 6.0–8.5)

## 2013-03-15 LAB — NMR, LIPOPROFILE
Cholesterol: 117 mg/dL (ref <200)
HDL Cholesterol by NMR: 51 mg/dL (ref >=40)
HDL Particle Number: 37.8 umol/L (ref >=30.5)
LDL Particle Number: 750 nmol/L (ref <1000)
LDL Size: 20.2 nm — ABNORMAL LOW (ref >20.5)
LDLC SERPL CALC-MCNC: 45 mg/dL (ref <100)
LP-IR Score: 67 — ABNORMAL HIGH (ref <=45)
Small LDL Particle Number: 434 nmol/L (ref <=527)
Triglycerides by NMR: 107 mg/dL (ref <150)

## 2013-03-15 LAB — VITAMIN D 25 HYDROXY (VIT D DEFICIENCY, FRACTURES): Vit D, 25-Hydroxy: 35 ng/mL (ref 30.0–100.0)

## 2013-03-21 ENCOUNTER — Other Ambulatory Visit: Payer: Self-pay | Admitting: Family Medicine

## 2013-03-21 DIAGNOSIS — E785 Hyperlipidemia, unspecified: Secondary | ICD-10-CM

## 2013-06-15 ENCOUNTER — Encounter: Payer: Self-pay | Admitting: Family Medicine

## 2013-06-15 ENCOUNTER — Ambulatory Visit: Payer: 59 | Admitting: General Practice

## 2013-06-15 ENCOUNTER — Ambulatory Visit (INDEPENDENT_AMBULATORY_CARE_PROVIDER_SITE_OTHER): Payer: 59 | Admitting: Family Medicine

## 2013-06-15 VITALS — BP 116/78 | HR 86 | Temp 98.6°F | Ht 66.0 in | Wt 221.0 lb

## 2013-06-15 DIAGNOSIS — R5383 Other fatigue: Secondary | ICD-10-CM

## 2013-06-15 DIAGNOSIS — N529 Male erectile dysfunction, unspecified: Secondary | ICD-10-CM

## 2013-06-15 DIAGNOSIS — E785 Hyperlipidemia, unspecified: Secondary | ICD-10-CM

## 2013-06-15 DIAGNOSIS — I10 Essential (primary) hypertension: Secondary | ICD-10-CM

## 2013-06-15 DIAGNOSIS — R5381 Other malaise: Secondary | ICD-10-CM

## 2013-06-15 DIAGNOSIS — E119 Type 2 diabetes mellitus without complications: Secondary | ICD-10-CM

## 2013-06-15 DIAGNOSIS — R809 Proteinuria, unspecified: Secondary | ICD-10-CM

## 2013-06-15 DIAGNOSIS — Z139 Encounter for screening, unspecified: Secondary | ICD-10-CM

## 2013-06-15 LAB — POCT CBC
Granulocyte percent: 67.1 %G (ref 37–80)
HCT, POC: 45 % (ref 43.5–53.7)
Hemoglobin: 14.8 g/dL (ref 14.1–18.1)
Lymph, poc: 1.9 (ref 0.6–3.4)
MCH, POC: 29.9 pg (ref 27–31.2)
MCHC: 32.9 g/dL (ref 31.8–35.4)
MCV: 90.8 fL (ref 80–97)
MPV: 6.9 fL (ref 0–99.8)
POC Granulocyte: 4.6 (ref 2–6.9)
POC LYMPH PERCENT: 27.6 %L (ref 10–50)
Platelet Count, POC: 270 10*3/uL (ref 142–424)
RBC: 5 M/uL (ref 4.69–6.13)
RDW, POC: 13.6 %
WBC: 6.9 10*3/uL (ref 4.6–10.2)

## 2013-06-15 LAB — POCT UA - MICROALBUMIN: Microalbumin Ur, POC: 20 mg/L

## 2013-06-15 LAB — POCT GLYCOSYLATED HEMOGLOBIN (HGB A1C): Hemoglobin A1C: 6.2

## 2013-06-15 MED ORDER — SILDENAFIL CITRATE 100 MG PO TABS: 100.0000 mg | ORAL_TABLET | ORAL | Status: DC | PRN

## 2013-06-15 NOTE — Progress Notes (Signed)
   Subjective:    Patient ID: Paul Williams, male    DOB: 01-31-1950, 63 y.o.   MRN: [id number]  HPI This 63 y.o. male presents for evaluation of hypertension, hyperlipdemia, and diabetes.   Review of Systems No chest pain, SOB, HA, dizziness, vision change, N/V, diarrhea, constipation, dysuria, urinary urgency or frequency, myalgias, arthralgias or rash.     Objective:   Physical Exam Vital signs noted  Well developed well nourished male.  HEENT - Head atraumatic Normocephalic                Eyes - PERRLA, Conjuctiva - clear Sclera- Clear EOMI                Ears - EAC's Wnl TM's Wnl Gross Hearing WNL                Nose - Nares patent                 Throat - oropharanx wnl Respiratory - Lungs CTA bilateral Cardiac - RRR S1 and S2 without murmur GI - Abdomen soft Nontender and bowel sounds active x 4 Extremities - No edema. Neuro - Grossly intact.  Results for orders placed in visit on 06/15/13  POCT CBC      Result Value Ref Range   WBC 6.9  4.6 - 10.2 K/uL   Lymph, poc 1.9  0.6 - 3.4   POC LYMPH PERCENT 27.6  10 - 50 %L   POC Granulocyte 4.6  2 - 6.9   Granulocyte percent 67.1  37 - 80 %G   RBC 5.0  4.69 - 6.13 M/uL   Hemoglobin 14.8  14.1 - 18.1 g/dL   HCT, POC 45.0  43.5 - 53.7 %   MCV 90.8  80 - 97 fL   MCH, POC 29.9  27 - 31.2 pg   MCHC 32.9  31.8 - 35.4 g/dL   RDW, POC 13.6     Platelet Count, POC 270.0  142 - 424 K/uL   MPV 6.9  0 - 99.8 fL  POCT GLYCOSYLATED HEMOGLOBIN (HGB A1C)      Result Value Ref Range   Hemoglobin A1C 6.2    POCT UA - MICROALBUMIN      Result Value Ref Range   Microalbumin Ur, POC 20 +POS         Assessment & Plan:  Fatigue - Plan: POCT CBC, Thyroid Panel With TSH  Diabetes mellitus, type 2 - Plan: POCT glycosylated hemoglobin (Hb A1C), POCT UA - Microalbumin, CMP14+EGFR, Microalbumin, urine  Hyperlipemia - Plan: Lipid panel  Hypertension - Plan: POCT CBC  Screening - Plan: PSA, total and free  Proteinuria - Plan:  Protein, urine, 24 hour  Erectile dysfunction - Plan: sildenafil (VIAGRA) 100 MG   Follow up in 3 months  Lysbeth Penner FNP

## 2013-06-16 LAB — CMP14+EGFR
ALT: 18 IU/L (ref 0–44)
AST: 24 IU/L (ref 0–40)
Albumin/Globulin Ratio: 1.8 (ref 1.1–2.5)
Albumin: 4.4 g/dL (ref 3.6–4.8)
Alkaline Phosphatase: 70 IU/L (ref 39–117)
BUN/Creatinine Ratio: 6 — ABNORMAL LOW (ref 10–22)
BUN: 8 mg/dL (ref 8–27)
CO2: 26 mmol/L (ref 18–29)
Calcium: 9.9 mg/dL (ref 8.6–10.2)
Chloride: 103 mmol/L (ref 97–108)
Creatinine, Ser: 1.44 mg/dL — ABNORMAL HIGH (ref 0.76–1.27)
GFR calc Af Amer: 61 mL/min/{1.73_m2} (ref >59)
GFR calc non Af Amer: 52 mL/min/{1.73_m2} — ABNORMAL LOW (ref >59)
Globulin, Total: 2.5 g/dL (ref 1.5–4.5)
Glucose: 111 mg/dL — ABNORMAL HIGH (ref 65–99)
Potassium: 4.2 mmol/L (ref 3.5–5.2)
Sodium: 141 mmol/L (ref 134–144)
Total Bilirubin: 0.6 mg/dL (ref 0.0–1.2)
Total Protein: 6.9 g/dL (ref 6.0–8.5)

## 2013-06-16 LAB — THYROID PANEL WITH TSH
Free Thyroxine Index: 2.8 (ref 1.2–4.9)
T3 Uptake Ratio: 32 % (ref 24–39)
T4, Total: 8.6 ug/dL (ref 4.5–12.0)
TSH: 1.58 u[IU]/mL (ref 0.450–4.500)

## 2013-06-16 LAB — LIPID PANEL
Chol/HDL Ratio: 2.2 ratio units (ref 0.0–5.0)
Cholesterol, Total: 119 mg/dL (ref 100–199)
HDL: 55 mg/dL (ref >39)
LDL Calculated: 49 mg/dL (ref 0–99)
Triglycerides: 76 mg/dL (ref 0–149)
VLDL Cholesterol Cal: 15 mg/dL (ref 5–40)

## 2013-06-16 LAB — MICROALBUMIN, URINE: Microalbumin, Urine: 32.9 ug/mL — ABNORMAL HIGH (ref 0.0–17.0)

## 2013-06-16 LAB — PSA, TOTAL AND FREE
PSA, Free Pct: 23.5 %
PSA, Free: 0.47 ng/mL
PSA: 2 ng/mL (ref 0.0–4.0)

## 2013-06-19 ENCOUNTER — Other Ambulatory Visit: Payer: Self-pay | Admitting: Family Medicine

## 2013-06-21 ENCOUNTER — Telehealth: Payer: Self-pay | Admitting: Family Medicine

## 2013-06-21 NOTE — Telephone Encounter (Signed)
Discussed results and recommendations with patient. He will come by to pickup 24 hr urine.

## 2013-06-25 ENCOUNTER — Other Ambulatory Visit: Payer: 59

## 2013-06-25 NOTE — Progress Notes (Signed)
Patient dropped off urine specimen

## 2013-06-26 LAB — PROTEIN, URINE, 24 HOUR
Protein, 24H Urine: <66 mg/24 hr (ref 30.0–150.0)
Protein, Ur: <4 mg/dL (ref 0.0–15.0)

## 2013-09-19 ENCOUNTER — Ambulatory Visit (INDEPENDENT_AMBULATORY_CARE_PROVIDER_SITE_OTHER): Payer: 59 | Admitting: Family Medicine

## 2013-09-19 ENCOUNTER — Encounter: Payer: Self-pay | Admitting: Family Medicine

## 2013-09-19 VITALS — BP 134/80 | HR 89 | Temp 98.3°F | Ht 66.0 in | Wt 220.0 lb

## 2013-09-19 DIAGNOSIS — E785 Hyperlipidemia, unspecified: Secondary | ICD-10-CM

## 2013-09-19 DIAGNOSIS — I10 Essential (primary) hypertension: Secondary | ICD-10-CM

## 2013-09-19 DIAGNOSIS — E119 Type 2 diabetes mellitus without complications: Secondary | ICD-10-CM

## 2013-09-19 LAB — POCT GLYCOSYLATED HEMOGLOBIN (HGB A1C): Hemoglobin A1C: 5.4

## 2013-09-19 NOTE — Progress Notes (Signed)
   Subjective:    Patient ID: Paul Williams, male    DOB: 03-05-1950, 63 y.o.   MRN: [id number]  HPI This 63 y.o. male presents for evaluation of hypertension, diabetes, and hyperlipidemia.   Review of Systems No chest pain, SOB, HA, dizziness, vision change, N/V, diarrhea, constipation, dysuria, urinary urgency or frequency, myalgias, arthralgias or rash.     Objective:   Physical Exam Vital signs noted  Well developed well nourished male.  HEENT - Head atraumatic Normocephalic                Eyes - PERRLA, Conjuctiva - clear Sclera- Clear EOMI                Ears - EAC's Wnl TM's Wnl Gross Hearing WNL                Nose - Nares patent                 Throat - oropharanx wnl Respiratory - Lungs CTA bilateral Cardiac - RRR S1 and S2 without murmur GI - Abdomen soft Nontender and bowel sounds active x 4 Extremities - No edema. Neuro - Grossly intact.  Results for orders placed in visit on 09/19/13  POCT GLYCOSYLATED HEMOGLOBIN (HGB A1C)      Result Value Ref Range   Hemoglobin A1C 5.4         Assessment & Plan:  Diabetes mellitus without complication - Plan: POCT glycosylated hemoglobin (Hb A1C)  Hyperlipidemia - Plan: Lipid panel  Essential hypertension - Plan: CMP14+EGFR  Follow up in 3 months  Lysbeth Penner FNP

## 2013-09-20 LAB — LIPID PANEL
Chol/HDL Ratio: 2 ratio units (ref 0.0–5.0)
Cholesterol, Total: 108 mg/dL (ref 100–199)
HDL: 54 mg/dL (ref >39)
LDL Calculated: 43 mg/dL (ref 0–99)
Triglycerides: 54 mg/dL (ref 0–149)
VLDL Cholesterol Cal: 11 mg/dL (ref 5–40)

## 2013-09-20 LAB — CMP14+EGFR
ALT: 18 IU/L (ref 0–44)
AST: 22 IU/L (ref 0–40)
Albumin/Globulin Ratio: 1.5 (ref 1.1–2.5)
Albumin: 4.3 g/dL (ref 3.6–4.8)
Alkaline Phosphatase: 60 IU/L (ref 39–117)
BUN/Creatinine Ratio: 7 — ABNORMAL LOW (ref 10–22)
BUN: 12 mg/dL (ref 8–27)
CO2: 24 mmol/L (ref 18–29)
Calcium: 10 mg/dL (ref 8.6–10.2)
Chloride: 102 mmol/L (ref 97–108)
Creatinine, Ser: 1.65 mg/dL — ABNORMAL HIGH (ref 0.76–1.27)
GFR calc Af Amer: 51 mL/min/{1.73_m2} — ABNORMAL LOW (ref >59)
GFR calc non Af Amer: 44 mL/min/{1.73_m2} — ABNORMAL LOW (ref >59)
Globulin, Total: 2.9 g/dL (ref 1.5–4.5)
Glucose: 117 mg/dL — ABNORMAL HIGH (ref 65–99)
Potassium: 4.2 mmol/L (ref 3.5–5.2)
Sodium: 140 mmol/L (ref 134–144)
Total Bilirubin: 0.6 mg/dL (ref 0.0–1.2)
Total Protein: 7.2 g/dL (ref 6.0–8.5)

## 2013-12-21 ENCOUNTER — Encounter: Payer: Self-pay | Admitting: Family Medicine

## 2013-12-21 ENCOUNTER — Ambulatory Visit (INDEPENDENT_AMBULATORY_CARE_PROVIDER_SITE_OTHER): Payer: 59 | Admitting: Family Medicine

## 2013-12-21 VITALS — BP 143/85 | HR 114 | Temp 99.0°F | Ht 66.0 in | Wt 220.4 lb

## 2013-12-21 DIAGNOSIS — E785 Hyperlipidemia, unspecified: Secondary | ICD-10-CM

## 2013-12-21 DIAGNOSIS — I10 Essential (primary) hypertension: Secondary | ICD-10-CM

## 2013-12-21 DIAGNOSIS — E119 Type 2 diabetes mellitus without complications: Secondary | ICD-10-CM

## 2013-12-21 DIAGNOSIS — N529 Male erectile dysfunction, unspecified: Secondary | ICD-10-CM

## 2013-12-21 LAB — POCT CBC
Granulocyte percent: 71.6 %G (ref 37–80)
HCT, POC: 48.5 % (ref 43.5–53.7)
Hemoglobin: 16 g/dL (ref 14.1–18.1)
Lymph, poc: 2.3 (ref 0.6–3.4)
MCH, POC: 30.3 pg (ref 27–31.2)
MCHC: 33 g/dL (ref 31.8–35.4)
MCV: 91.8 fL (ref 80–97)
MPV: 7 fL (ref 0–99.8)
POC Granulocyte: 6.4 (ref 2–6.9)
POC LYMPH PERCENT: 25.4 %L (ref 10–50)
Platelet Count, POC: 316 10*3/uL (ref 142–424)
RBC: 5.3 M/uL (ref 4.69–6.13)
RDW, POC: 13.1 %
WBC: 8.9 10*3/uL (ref 4.6–10.2)

## 2013-12-21 NOTE — Progress Notes (Signed)
   Subjective:    Patient ID: Paul Williams, male    DOB: 1950-01-12, 63 y.o.   MRN: [id number]  HPI Patient is here for routine visit.  Review of Systems  Constitutional: Negative for fever.  HENT: Negative for ear pain.   Eyes: Negative for discharge.  Respiratory: Negative for cough.   Cardiovascular: Negative for chest pain.  Gastrointestinal: Negative for abdominal distention.  Endocrine: Negative for polyuria.  Genitourinary: Negative for difficulty urinating.  Musculoskeletal: Negative for gait problem and neck pain.  Skin: Negative for color change and rash.  Neurological: Negative for speech difficulty and headaches.  Psychiatric/Behavioral: Negative for agitation.       Objective:    BP 143/85 mmHg  Pulse 114  Temp(Src) 99 F (37.2 C) (Oral)  Ht _0  (1.676 m)  Wt 220 lb 6.4 oz (99.973 kg)  BMI 35.59 kg/m2 Physical Exam  Constitutional: He is oriented to person, place, and time. He appears well-developed and well-nourished.  HENT:  Head: Normocephalic and atraumatic.  Mouth/Throat: Oropharynx is clear and moist.  Eyes: Pupils are equal, round, and reactive to light.  Neck: Normal range of motion. Neck supple.  Cardiovascular: Normal rate and regular rhythm.   No murmur heard. Pulmonary/Chest: Effort normal and breath sounds normal.  Abdominal: Soft. Bowel sounds are normal. There is no tenderness.  Neurological: He is alert and oriented to person, place, and time.  Skin: Skin is warm and dry.  Psychiatric: He has a normal mood and affect.          Assessment & Plan:     ICD-9-CM ICD-10-CM   1. Essential hypertension 401.9 I10 POCT CBC     CMP14+EGFR     No Follow-up on file.  Lysbeth Penner FNP

## 2013-12-22 LAB — CMP14+EGFR
ALT: 32 IU/L (ref 0–44)
AST: 30 IU/L (ref 0–40)
Albumin/Globulin Ratio: 1.7 (ref 1.1–2.5)
Albumin: 4.7 g/dL (ref 3.6–4.8)
Alkaline Phosphatase: 72 IU/L (ref 39–117)
BUN/Creatinine Ratio: 7 — ABNORMAL LOW (ref 10–22)
BUN: 12 mg/dL (ref 8–27)
CO2: 25 mmol/L (ref 18–29)
Calcium: 10.6 mg/dL — ABNORMAL HIGH (ref 8.6–10.2)
Chloride: 99 mmol/L (ref 97–108)
Creatinine, Ser: 1.66 mg/dL — ABNORMAL HIGH (ref 0.76–1.27)
GFR calc Af Amer: 51 mL/min/{1.73_m2} — ABNORMAL LOW (ref >59)
GFR calc non Af Amer: 44 mL/min/{1.73_m2} — ABNORMAL LOW (ref >59)
Globulin, Total: 2.8 g/dL (ref 1.5–4.5)
Glucose: 158 mg/dL — ABNORMAL HIGH (ref 65–99)
Potassium: 4.3 mmol/L (ref 3.5–5.2)
Sodium: 139 mmol/L (ref 134–144)
Total Bilirubin: 0.9 mg/dL (ref 0.0–1.2)
Total Protein: 7.5 g/dL (ref 6.0–8.5)

## 2013-12-26 ENCOUNTER — Telehealth: Payer: Self-pay | Admitting: *Deleted

## 2013-12-26 MED ORDER — ATORVASTATIN CALCIUM 40 MG PO TABS: 20.0000 mg | ORAL_TABLET | Freq: Every day | ORAL | Status: DC

## 2013-12-26 MED ORDER — LISINOPRIL-HYDROCHLOROTHIAZIDE 20-12.5 MG PO TABS: 1.0000 | ORAL_TABLET | Freq: Every day | ORAL | Status: DC

## 2013-12-26 MED ORDER — SILDENAFIL CITRATE 100 MG PO TABS: 100.0000 mg | ORAL_TABLET | ORAL | Status: DC | PRN

## 2013-12-26 MED ORDER — SITAGLIPTIN PHOSPHATE 100 MG PO TABS: 50.0000 mg | ORAL_TABLET | Freq: Every day | ORAL | Status: DC

## 2013-12-26 NOTE — Telephone Encounter (Signed)
Aware of results and need for repeat lab.  Scripts refilled.

## 2013-12-26 NOTE — Addendum Note (Signed)
Addended by: Shelbie Ammons on: 12/26/2013 12:01 PM   Modules accepted: Orders

## 2013-12-27 ENCOUNTER — Other Ambulatory Visit: Payer: 59

## 2013-12-27 DIAGNOSIS — R799 Abnormal finding of blood chemistry, unspecified: Secondary | ICD-10-CM

## 2013-12-31 LAB — PARATHYROID HORMONE, INTACT (NO CA): PTH: 22 pg/mL (ref 15–65)

## 2013-12-31 LAB — SPECIMEN STATUS REPORT

## 2014-03-28 ENCOUNTER — Encounter: Payer: Self-pay | Admitting: Family Medicine

## 2014-03-28 ENCOUNTER — Ambulatory Visit (INDEPENDENT_AMBULATORY_CARE_PROVIDER_SITE_OTHER): Payer: 59 | Admitting: Family Medicine

## 2014-03-28 VITALS — BP 140/85 | HR 106 | Temp 97.2°F | Ht 66.0 in | Wt 220.0 lb

## 2014-03-28 DIAGNOSIS — E119 Type 2 diabetes mellitus without complications: Secondary | ICD-10-CM | POA: Diagnosis not present

## 2014-03-28 DIAGNOSIS — Z8042 Family history of malignant neoplasm of prostate: Secondary | ICD-10-CM

## 2014-03-28 DIAGNOSIS — I10 Essential (primary) hypertension: Secondary | ICD-10-CM

## 2014-03-28 DIAGNOSIS — E785 Hyperlipidemia, unspecified: Secondary | ICD-10-CM | POA: Diagnosis not present

## 2014-03-28 LAB — POCT GLYCOSYLATED HEMOGLOBIN (HGB A1C): Hemoglobin A1C: 6.5

## 2014-03-28 NOTE — Progress Notes (Signed)
   Subjective:    Patient ID: Paul Williams, male    DOB: [birth year], 64 y.o.   MRN: [id number]  HPI 64 year old gentleman here to follow-up diabetes and hypertension. 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 Diabetes is treated with Januvia and apparently that's doing well for him; last A1c 6 months ago was 5.4. Lipids were also checked at that time and LDL was 43 and he tolerates atorvastatin without side effects.    Review of Systems  Constitutional: Negative.   HENT: Negative.   Eyes: Negative.   Respiratory: Negative.   Cardiovascular: Negative.   Neurological: Negative.   Psychiatric/Behavioral: Negative.        Patient Active Problem List   Diagnosis Date Noted  . Hyperlipidemia   . Hypertension   . Diabetes mellitus without complication    Outpatient Encounter Prescriptions as of 03/28/2014  Medication Sig  . atorvastatin (LIPITOR) 40 MG tablet Take 0.5 tablets (20 mg total) by mouth daily.  . Cholecalciferol (VITAMIN D3) 5000 UNITS CAPS Take 1 capsule by mouth daily.    . fish oil-omega-3 fatty acids 1000 MG capsule Take 1 g by mouth daily.    Marland Kitchen lisinopril-hydrochlorothiazide (ZESTORETIC) 20-12.5 MG per tablet Take 1 tablet by mouth daily.  Marland Kitchen OVER THE COUNTER MEDICATION Take 1 tablet by mouth daily. Prosvent   . sildenafil (VIAGRA) 100 MG tablet Take 1 tablet (100 mg total) by mouth as needed.  . sitaGLIPtin (JANUVIA) 100 MG tablet Take 0.5 tablets (50 mg total) by mouth daily.  . [DISCONTINUED] Multiple Vitamins-Minerals (MULTIVITAMIN WITH MINERALS) tablet Take 1 tablet by mouth daily.     Objective:   Physical Exam  Constitutional: He is oriented to person, place, and time. He appears well-developed and well-nourished.  Cardiovascular: Normal rate, regular rhythm and intact distal pulses.   Pulmonary/Chest: Effort normal and breath sounds normal.    Abdominal: Soft. Bowel sounds are normal.  Genitourinary: Prostate normal.  Neurological: He is alert and oriented to person, place, and time.  Psychiatric: He has a normal mood and affect. His behavior is normal.          Assessment & Plan:  1. Diabetes mellitus without complication Continue Januvia - BMP8+EGFR - POCT glycosylated hemoglobin (Hb A1C)  2. Essential hypertension BP good on lisinopril  3. Hyperlipidemia Continue atorvastatin  Wardell Honour MD  4. Family history of prostate cancer in father Exam okay but needs good surveillance - PSA, total and free

## 2014-03-28 NOTE — Progress Notes (Signed)
   Subjective:    Patient ID: Paul Williams, male    DOB: [birth year], 64 y.o.   MRN: [id number]  HPI  Patient Active Problem List   Diagnosis Date Noted  . Hyperlipidemia   . Hypertension   . Diabetes mellitus without complication    Outpatient Encounter Prescriptions as of 03/28/2014  Medication Sig  . atorvastatin (LIPITOR) 40 MG tablet Take 0.5 tablets (20 mg total) by mouth daily.  . Cholecalciferol (VITAMIN D3) 5000 UNITS CAPS Take 1 capsule by mouth daily.    . fish oil-omega-3 fatty acids 1000 MG capsule Take 1 g by mouth daily.    Marland Kitchen lisinopril-hydrochlorothiazide (ZESTORETIC) 20-12.5 MG per tablet Take 1 tablet by mouth daily.  Marland Kitchen OVER THE COUNTER MEDICATION Take 1 tablet by mouth daily. Prosvent   . sildenafil (VIAGRA) 100 MG tablet Take 1 tablet (100 mg total) by mouth as needed.  . sitaGLIPtin (JANUVIA) 100 MG tablet Take 0.5 tablets (50 mg total) by mouth daily.  . [DISCONTINUED] Multiple Vitamins-Minerals (MULTIVITAMIN WITH MINERALS) tablet Take 1 tablet by mouth daily.        Review of Systems     Objective:   Physical Exam BP 140/85 mmHg  Pulse 106  Temp(Src) 97.2 F (36.2 C) (Oral)  Ht 5\' 6"  (1.676 m)  Wt 220 lb (99.791 kg)  BMI 35.53 kg/m2       Assessment & Plan:

## 2014-03-29 LAB — BMP8+EGFR
BUN/Creatinine Ratio: 7 — ABNORMAL LOW (ref 10–22)
BUN: 11 mg/dL (ref 8–27)
CHLORIDE: 103 mmol/L (ref 97–108)
CO2: 25 mmol/L (ref 18–29)
Calcium: 10.3 mg/dL — ABNORMAL HIGH (ref 8.6–10.2)
Creatinine, Ser: 1.57 mg/dL — ABNORMAL HIGH (ref 0.76–1.27)
GFR calc Af Amer: 54 mL/min/{1.73_m2} — ABNORMAL LOW (ref >59)
GFR calc non Af Amer: 47 mL/min/{1.73_m2} — ABNORMAL LOW (ref >59)
Glucose: 116 mg/dL — ABNORMAL HIGH (ref 65–99)
Potassium: 4.1 mmol/L (ref 3.5–5.2)
SODIUM: 143 mmol/L (ref 134–144)

## 2014-03-29 LAB — PSA, TOTAL AND FREE
PSA FREE PCT: 22.5 %
PSA FREE: 0.45 ng/mL
PSA: 2 ng/mL (ref 0.0–4.0)

## 2014-04-02 ENCOUNTER — Telehealth: Payer: Self-pay | Admitting: Family Medicine

## 2014-04-02 NOTE — Telephone Encounter (Signed)
Patient aware of results.

## 2014-07-30 ENCOUNTER — Ambulatory Visit (INDEPENDENT_AMBULATORY_CARE_PROVIDER_SITE_OTHER): Payer: 59 | Admitting: Family Medicine

## 2014-07-30 ENCOUNTER — Encounter: Payer: Self-pay | Admitting: Family Medicine

## 2014-07-30 VITALS — BP 140/77 | HR 94 | Temp 98.3°F | Ht 66.0 in | Wt 220.0 lb

## 2014-07-30 DIAGNOSIS — I10 Essential (primary) hypertension: Secondary | ICD-10-CM

## 2014-07-30 DIAGNOSIS — E785 Hyperlipidemia, unspecified: Secondary | ICD-10-CM | POA: Diagnosis not present

## 2014-07-30 DIAGNOSIS — E119 Type 2 diabetes mellitus without complications: Secondary | ICD-10-CM | POA: Diagnosis not present

## 2014-07-30 LAB — POCT UA - MICROALBUMIN: Microalbumin Ur, POC: 20 mg/L

## 2014-07-30 LAB — POCT GLYCOSYLATED HEMOGLOBIN (HGB A1C): Hemoglobin A1C: 6.4

## 2014-07-30 NOTE — Patient Instructions (Signed)
You may receive a survey either by mail or email. Please take time to complete this as it helps [place] to serve you better.  Consider having Prevnar (pneumonia vaccine) at next visit  Pneumococcal Vaccine, Polyvalent suspension for injection What is this medicine? PNEUMOCOCCAL VACCINE, POLYVALENT (NEU mo KOK al vak SEEN, pol ee VEY luhnt) is a vaccine to prevent pneumococcus bacteria infection. These bacteria are a major cause of ear infections, 'Strep throat' infections, and serious pneumonia, meningitis, or blood infections worldwide. These vaccines help the body to produce antibodies (protective substances) that help your body defend against these bacteria. This vaccine is recommended for infants and young children. This vaccine will not treat an infection. This medicine may be used for other purposes; ask your health care provider or pharmacist if you have questions. COMMON BRAND NAME(S): Prevnar 13 What should I tell my health care provider before I take this medicine? They need to know if you have any of these conditions: -bleeding problems -fever -immune system problems -low platelet count in the blood -seizures -an unusual or allergic reaction to pneumococcal vaccine, diphtheria toxoid, other vaccines, latex, other medicines, foods, dyes, or preservatives -pregnant or trying to get pregnant -breast-feeding How should I use this medicine? This vaccine is for injection into a muscle. It is given by a health care professional. A copy of Vaccine Information Statements will be given before each vaccination. Read this sheet carefully each time. The sheet may change frequently. Talk to your pediatrician regarding the use of this medicine in children. While this drug may be prescribed for children as young as 49 weeks old for selected conditions, precautions do apply. Overdosage: If you think you have taken too much of this medicine contact a poison control center or emergency room at once. NOTE:  This medicine is only for you. Do not share this medicine with others. What if I miss a dose? It is important not to miss your dose. Call your doctor or health care professional if you are unable to keep an appointment. What may interact with this medicine? -medicines for cancer chemotherapy -medicines that suppress your immune function -medicines that treat or prevent blood clots like warfarin, enoxaparin, and dalteparin -steroid medicines like prednisone or cortisone This list may not describe all possible interactions. Give your health care provider a list of all the medicines, herbs, non-prescription drugs, or dietary supplements you use. Also tell them if you smoke, drink alcohol, or use illegal drugs. Some items may interact with your medicine. What should I watch for while using this medicine? Mild fever and pain should go away in 3 days or less. Report any unusual symptoms to your doctor or health care professional. What side effects may I notice from receiving this medicine? Side effects that you should report to your doctor or health care professional as soon as possible: -allergic reactions like skin rash, itching or hives, swelling of the face, lips, or tongue -breathing problems -confused -fever over 102 degrees F -pain, tingling, numbness in the hands or feet -seizures -unusual bleeding or bruising -unusual muscle weakness Side effects that usually do not require medical attention (report to your doctor or health care professional if they continue or are bothersome): -aches and pains -diarrhea -fever of 102 degrees F or less -headache -irritable -loss of appetite -pain, tender at site where injected -trouble sleeping This list may not describe all possible side effects. Call your doctor for medical advice about side effects. You may report side effects to FDA at  1-800-FDA-1088. Where should I keep my medicine? This does not apply. This vaccine is given in a clinic,  pharmacy, doctor's office, or other health care setting and will not be stored at home. NOTE: This sheet is a summary. It may not cover all possible information. If you have questions about this medicine, talk to your doctor, pharmacist, or health care provider.  2015, Elsevier/Gold Standard. (2008-03-05 10:17:22)

## 2014-07-30 NOTE — Progress Notes (Signed)
   Subjective:    Patient ID: Paul Williams, male    DOB: 1950/02/10, 64 y.o.   MRN: [id number]  HPI 64 year old gentleman here with hypertension, diabetes, and hyperlipidemia. He has been doing very well. When he checks sugars at home have been usually around 100 or below. He is compliant with diet and medication. Patient Active Problem List   Diagnosis Date Noted  . Hyperlipidemia   . Hypertension   . Diabetes mellitus without complication    Outpatient Encounter Prescriptions as of 07/30/2014  Medication Sig  . atorvastatin (LIPITOR) 40 MG tablet Take 0.5 tablets (20 mg total) by mouth daily.  . fish oil-omega-3 fatty acids 1000 MG capsule Take 1 g by mouth daily.    Marland Kitchen lisinopril-hydrochlorothiazide (ZESTORETIC) 20-12.5 MG per tablet Take 1 tablet by mouth daily.  Marland Kitchen OVER THE COUNTER MEDICATION Take 1 tablet by mouth daily. Prosvent   . sildenafil (VIAGRA) 100 MG tablet Take 1 tablet (100 mg total) by mouth as needed.  . sitaGLIPtin (JANUVIA) 100 MG tablet Take 0.5 tablets (50 mg total) by mouth daily.  . Cholecalciferol (VITAMIN D3) 5000 UNITS CAPS Take 1 capsule by mouth daily.     No facility-administered encounter medications on file as of 07/30/2014.       Review of Systems  Constitutional: Negative.   HENT: Negative.   Respiratory: Negative.   Cardiovascular: Negative.   Gastrointestinal: Negative.   Endocrine: Negative.   Genitourinary: Negative.   Neurological: Negative.   Psychiatric/Behavioral: Negative.        Objective:   Physical Exam  Constitutional: He is oriented to person, place, and time. He appears well-developed.  Cardiovascular: Normal rate and regular rhythm.   Pulmonary/Chest: Effort normal and breath sounds normal.  Abdominal: Soft. Bowel sounds are normal. He exhibits distension.  Musculoskeletal: Normal range of motion.  Neurological: He is alert and oriented to person, place, and time.  Psychiatric: He has a normal mood and affect.     BP  140/77 mmHg  Pulse 94  Temp(Src) 98.3 F (36.8 C) (Oral)  Ht 5\' 6"  (1.676 m)  Wt 220 lb (99.791 kg)  BMI 35.53 kg/m2      Assessment & Plan:  1. Hyperlipidemia LDL cholesterol was at goal when last checked 11 months ago. Will repeat today  2. Diabetes mellitus without complication E3P is 6.4 today indicating good control. Will continue with Januvia as before - POCT glycosylated hemoglobin (Hb A1C) - POCT UA - Microalbumin - Microalbumin, urine  3. Essential hypertension Blood pressure is controlled with lisinopril and hydrochlorothiazide. No changes are recommended  Wardell Honour MD

## 2014-07-31 LAB — MICROALBUMIN, URINE: Microalbumin, Urine: 7.3 ug/mL

## 2014-09-03 LAB — HM DIABETES EYE EXAM

## 2014-09-12 ENCOUNTER — Encounter: Payer: Self-pay | Admitting: *Deleted

## 2014-09-26 ENCOUNTER — Encounter: Payer: Self-pay | Admitting: Family Medicine

## 2015-01-29 ENCOUNTER — Encounter: Payer: Self-pay | Admitting: Family Medicine

## 2015-01-29 ENCOUNTER — Ambulatory Visit (INDEPENDENT_AMBULATORY_CARE_PROVIDER_SITE_OTHER): Payer: 59 | Admitting: Family Medicine

## 2015-01-29 VITALS — BP 131/88 | HR 108 | Temp 98.9°F | Ht 66.0 in | Wt 229.0 lb

## 2015-01-29 DIAGNOSIS — E119 Type 2 diabetes mellitus without complications: Secondary | ICD-10-CM | POA: Diagnosis not present

## 2015-01-29 LAB — POCT GLYCOSYLATED HEMOGLOBIN (HGB A1C): Hemoglobin A1C: 5.3

## 2015-01-29 NOTE — Progress Notes (Signed)
   Subjective:    Patient ID: Paul Williams, male    DOB: [birth year], 65 y.o.   MRN: [id number]  HPI 65 year old gentleman with diabetes hyperlipidemia and hypertension. He is very compliant except for his gaining weight since last visit. He watches his diet. Januvia has worked well for him. Last A1c was 6.4.  Patient Active Problem List   Diagnosis Date Noted  . Hyperlipidemia   . Hypertension   . Diabetes mellitus without complication [facility])    Outpatient Encounter Prescriptions as of 01/29/2015  Medication Sig  . atorvastatin (LIPITOR) 40 MG tablet Take 0.5 tablets (20 mg total) by mouth daily.  . Cholecalciferol (VITAMIN D3) 5000 UNITS CAPS Take 1 capsule by mouth daily.    . fish oil-omega-3 fatty acids 1000 MG capsule Take 1 g by mouth daily.    Marland Kitchen lisinopril-hydrochlorothiazide (ZESTORETIC) 20-12.5 MG per tablet Take 1 tablet by mouth daily.  Marland Kitchen OVER THE COUNTER MEDICATION Take 1 tablet by mouth daily. Prosvent   . sildenafil (VIAGRA) 100 MG tablet Take 1 tablet (100 mg total) by mouth as needed.  . [DISCONTINUED] sitaGLIPtin (JANUVIA) 100 MG tablet Take 0.5 tablets (50 mg total) by mouth daily.  Marland Kitchen JANUVIA 50 MG tablet Take 1 tablet by mouth daily.   No facility-administered encounter medications on file as of 01/29/2015.      Review of Systems  Constitutional: Negative.   HENT: Negative.   Eyes: Negative.   Respiratory: Negative.  Negative for shortness of breath.   Cardiovascular: Negative.  Negative for chest pain and leg swelling.  Gastrointestinal: Negative.   Genitourinary: Negative.   Musculoskeletal: Negative.   Skin: Negative.   Neurological: Negative.   Psychiatric/Behavioral: Negative.   All other systems reviewed and are negative.      Objective:   Physical Exam  Constitutional: He is oriented to person, place, and time. He appears well-developed and well-nourished.  Cardiovascular: Normal rate, regular rhythm, normal heart sounds and intact distal pulses.     Pulmonary/Chest: Effort normal and breath sounds normal.  Neurological: He is alert and oriented to person, place, and time.          Assessment & Plan:  1. Diabetes mellitus without complication ([place]) See today is now 5.3. I only recommendation is to lose weight he has gained and watch his caffeine intake as he is slightly tachycardic today for no other reason. Tachycardias regular. - POCT glycosylated hemoglobin (Hb A1C)  Wardell Honour MD

## 2015-01-30 ENCOUNTER — Ambulatory Visit: Payer: 59 | Admitting: Family Medicine

## 2015-02-17 ENCOUNTER — Other Ambulatory Visit: Payer: Self-pay | Admitting: Nurse Practitioner

## 2015-07-04 ENCOUNTER — Other Ambulatory Visit: Payer: Self-pay | Admitting: Nurse Practitioner

## 2015-07-23 ENCOUNTER — Encounter: Payer: Self-pay | Admitting: Internal Medicine

## 2015-07-25 ENCOUNTER — Other Ambulatory Visit: Payer: Self-pay | Admitting: Family Medicine

## 2015-07-25 ENCOUNTER — Other Ambulatory Visit: Payer: Self-pay | Admitting: Nurse Practitioner

## 2015-07-30 ENCOUNTER — Encounter: Payer: Self-pay | Admitting: Family Medicine

## 2015-07-30 ENCOUNTER — Ambulatory Visit (INDEPENDENT_AMBULATORY_CARE_PROVIDER_SITE_OTHER): Payer: 59 | Admitting: Family Medicine

## 2015-07-30 VITALS — BP 152/88 | HR 110 | Temp 97.7°F | Ht 66.0 in | Wt 227.4 lb

## 2015-07-30 DIAGNOSIS — E785 Hyperlipidemia, unspecified: Secondary | ICD-10-CM | POA: Diagnosis not present

## 2015-07-30 DIAGNOSIS — E119 Type 2 diabetes mellitus without complications: Secondary | ICD-10-CM

## 2015-07-30 DIAGNOSIS — I10 Essential (primary) hypertension: Secondary | ICD-10-CM | POA: Diagnosis not present

## 2015-07-30 LAB — BAYER DCA HB A1C WAIVED: HB A1C (BAYER DCA - WAIVED): 6.4 % (ref <7.0)

## 2015-07-30 NOTE — Progress Notes (Signed)
   Subjective:    Patient ID: Paul Williams, male    DOB: 25-Jun-1950, 65 y.o.   MRN: [id number]  HPI 65 year old generally here to follow-up diabetes and high blood pressure. A1c's of been excellent on only Januvia. He continues to struggle with his weight. Blood pressure is up a little bit today but is generally okay. Heart rate is also up a little bit. I think there is a certain amount of anxiety associated with 1 to the doctor with this plan. He denies any pain burning or tingling in his legs.  Patient Active Problem List   Diagnosis Date Noted  . Hyperlipidemia   . Hypertension   . Diabetes mellitus without complication [facility])    Outpatient Encounter Prescriptions as of 07/30/2015  Medication Sig  . atorvastatin (LIPITOR) 20 MG tablet TAKE ONE (1) TABLET EACH DAY  . Cholecalciferol (VITAMIN D3) 5000 UNITS CAPS Take 1 capsule by mouth daily.    . fish oil-omega-3 fatty acids 1000 MG capsule Take 1 g by mouth daily.    Marland Kitchen JANUVIA 50 MG tablet TAKE ONE (1) TABLET EACH DAY  . lisinopril-hydrochlorothiazide (PRINZIDE,ZESTORETIC) 20-12.5 MG tablet TAKE ONE (1) TABLET EACH DAY  . OVER THE COUNTER MEDICATION Take 1 tablet by mouth daily. Prosvent   . sildenafil (VIAGRA) 100 MG tablet Take 1 tablet (100 mg total) by mouth as needed.  . [DISCONTINUED] atorvastatin (LIPITOR) 40 MG tablet Take 0.5 tablets (20 mg total) by mouth daily.   No facility-administered encounter medications on file as of 07/30/2015.         Review of Systems  Constitutional: Negative.   HENT: Negative.   Eyes: Negative.   Respiratory: Negative.  Negative for shortness of breath.   Cardiovascular: Negative.  Negative for chest pain and leg swelling.  Gastrointestinal: Negative.   Genitourinary: Negative.   Musculoskeletal: Negative.   Skin: Negative.   Neurological: Negative.   Psychiatric/Behavioral: Negative.   All other systems reviewed and are negative.      Objective:   Physical Exam  Constitutional: He  is oriented to person, place, and time. He appears well-developed and well-nourished.  Cardiovascular: Normal rate, regular rhythm, normal heart sounds and intact distal pulses.   Pulmonary/Chest: Effort normal and breath sounds normal.  Neurological: He is alert and oriented to person, place, and time.  Psychiatric: He has a normal mood and affect. His behavior is normal.   BP (!) 152/88 (BP Location: Right Arm, Patient Position: Sitting, Cuff Size: Normal)   Pulse (!) 110   Temp 97.7 F (36.5 C) (Oral)   Ht '5\' 6"'$  (1.676 m)   Wt 227 lb 6.4 oz (103.1 kg)   BMI 36.70 kg/m         Assessment & Plan:  1. Essential hypertension Blood pressures are generally well controlled will not change medicine because is up a little bit today - CMP14+EGFR  2. Diabetes mellitus without complication ([place]) Despite A1c still being in the normal range - Bayer DCA Hb A1c Waived - Microalbumin / creatinine urine ratio  3. Hyperlipidemia Lipids  have not been assessed since 2 years ago. He continues to take atorvastatin 20 mg - Lipid panel  Wardell Honour MD

## 2015-07-31 LAB — LIPID PANEL
CHOL/HDL RATIO: 2.1 ratio (ref 0.0–5.0)
Cholesterol, Total: 122 mg/dL (ref 100–199)
HDL: 57 mg/dL (ref >39)
LDL CALC: 49 mg/dL (ref 0–99)
Triglycerides: 82 mg/dL (ref 0–149)
VLDL Cholesterol Cal: 16 mg/dL (ref 5–40)

## 2015-07-31 LAB — CMP14+EGFR
ALK PHOS: 76 IU/L (ref 39–117)
ALT: 29 IU/L (ref 0–44)
AST: 32 IU/L (ref 0–40)
Albumin/Globulin Ratio: 1.4 (ref 1.2–2.2)
Albumin: 4.6 g/dL (ref 3.6–4.8)
BUN/Creatinine Ratio: 9 — ABNORMAL LOW (ref 10–24)
BUN: 18 mg/dL (ref 8–27)
Bilirubin Total: 1.1 mg/dL (ref 0.0–1.2)
CALCIUM: 10.8 mg/dL — AB (ref 8.6–10.2)
CO2: 23 mmol/L (ref 18–29)
CREATININE: 1.94 mg/dL — AB (ref 0.76–1.27)
Chloride: 100 mmol/L (ref 96–106)
GFR calc Af Amer: 41 mL/min/{1.73_m2} — ABNORMAL LOW (ref >59)
GFR, EST NON AFRICAN AMERICAN: 36 mL/min/{1.73_m2} — AB (ref >59)
GLUCOSE: 156 mg/dL — AB (ref 65–99)
Globulin, Total: 3.3 g/dL (ref 1.5–4.5)
Potassium: 4.2 mmol/L (ref 3.5–5.2)
Sodium: 143 mmol/L (ref 134–144)
Total Protein: 7.9 g/dL (ref 6.0–8.5)

## 2015-07-31 LAB — MICROALBUMIN / CREATININE URINE RATIO
CREATININE, UR: 365 mg/dL
MICROALB/CREAT RATIO: 4.2 mg/g creat (ref 0.0–30.0)
Microalbumin, Urine: 15.4 ug/mL

## 2015-08-04 ENCOUNTER — Encounter: Payer: Self-pay | Admitting: Internal Medicine

## 2015-08-04 NOTE — Progress Notes (Signed)
Patient aware.

## 2015-08-21 ENCOUNTER — Ambulatory Visit (AMBULATORY_SURGERY_CENTER): Payer: Self-pay | Admitting: *Deleted

## 2015-08-21 VITALS — Ht 67.0 in | Wt 232.0 lb

## 2015-08-21 DIAGNOSIS — Z86010 Personal history of colonic polyps: Secondary | ICD-10-CM

## 2015-08-21 MED ORDER — NA SULFATE-K SULFATE-MG SULF 17.5-3.13-1.6 GM/177ML PO SOLN: 1.0000 | Freq: Once | ORAL | 0 refills | Status: AC

## 2015-08-27 ENCOUNTER — Other Ambulatory Visit: Payer: Self-pay | Admitting: Family Medicine

## 2015-08-27 ENCOUNTER — Encounter: Payer: Self-pay | Admitting: Internal Medicine

## 2015-09-04 ENCOUNTER — Encounter: Payer: Self-pay | Admitting: Internal Medicine

## 2015-09-04 ENCOUNTER — Ambulatory Visit (AMBULATORY_SURGERY_CENTER): Payer: 59 | Admitting: Internal Medicine

## 2015-09-04 VITALS — BP 117/78 | HR 97 | Temp 97.7°F | Resp 21 | Ht 67.0 in | Wt 232.0 lb

## 2015-09-04 DIAGNOSIS — D124 Benign neoplasm of descending colon: Secondary | ICD-10-CM | POA: Diagnosis not present

## 2015-09-04 DIAGNOSIS — Z86010 Personal history of colonic polyps: Secondary | ICD-10-CM

## 2015-09-04 DIAGNOSIS — D122 Benign neoplasm of ascending colon: Secondary | ICD-10-CM

## 2015-09-04 MED ORDER — SODIUM CHLORIDE 0.9 % IV SOLN: 500.0000 mL | INTRAVENOUS | Status: DC

## 2015-09-04 NOTE — Patient Instructions (Signed)
Impression/recommendations:  Polyps (handout given) Diverticulosis (handout given) High Fiber Diet (handout given) Hemorrhoids (handout given)  YOU HAD AN ENDOSCOPIC PROCEDURE TODAY AT THE [facility]:   Refer to the procedure report that was given to you for any specific questions about what was found during the examination.  If the procedure report does not answer your questions, please call your gastroenterologist to clarify.  If you requested that your care partner not be given the details of your procedure findings, then the procedure report has been included in a sealed envelope for you to review at your convenience later.  YOU SHOULD EXPECT: Some feelings of bloating in the abdomen. Passage of more gas than usual.  Walking can help get rid of the air that was put into your GI tract during the procedure and reduce the bloating. If you had a lower endoscopy (such as a colonoscopy or flexible sigmoidoscopy) you may notice spotting of blood in your stool or on the toilet paper. If you underwent a bowel prep for your procedure, you may not have a normal bowel movement for a few days.  Please Note:  You might notice some irritation and congestion in your nose or some drainage.  This is from the oxygen used during your procedure.  There is no need for concern and it should clear up in a day or so.  SYMPTOMS TO REPORT IMMEDIATELY:   Following lower endoscopy (colonoscopy or flexible sigmoidoscopy):  Excessive amounts of blood in the stool  Significant tenderness or worsening of abdominal pains  Swelling of the abdomen that is new, acute  Fever of 100F or higher  For urgent or emergent issues, a gastroenterologist can be reached at any hour by calling [phone].   DIET:  We do recommend a small meal at first, but then you may proceed to your regular diet.  Drink plenty of fluids but you should avoid alcoholic beverages for 24 hours.  ACTIVITY:  You should plan to take it  easy for the rest of today and you should NOT DRIVE or use heavy machinery until tomorrow (because of the sedation medicines used during the test).    FOLLOW UP: Our staff will call the number listed on your records the next business day following your procedure to check on you and address any questions or concerns that you may have regarding the information given to you following your procedure. If we do not reach you, we will leave a message.  However, if you are feeling well and you are not experiencing any problems, there is no need to return our call.  We will assume that you have returned to your regular daily activities without incident.  If any biopsies were taken you will be contacted by phone or by letter within the next 1-3 weeks.  Please call us at [phone] if you have not heard about the biopsies in 3 weeks.    SIGNATURES/CONFIDENTIALITY: You and/or your care partner have signed paperwork which will be entered into your electronic medical record.  These signatures attest to the fact that that the information above on your After Visit Summary has been reviewed and is understood.  Full responsibility of the confidentiality of this discharge information lies with you and/or your care-partner. 

## 2015-09-04 NOTE — Op Note (Signed)
[place] Patient Name: Olujimi Banning Procedure Date: 09/04/2015 11:29 AM MRN: [id number] Endoscopist: Jerene Bears , MD Age: 65 Referring MD:  Date of Birth: [birth year] Gender: Male Account #: [id number] Procedure:                Colonoscopy Indications:              High risk colon cancer surveillance: Personal                            history of non-advanced adenoma (2009), Last                            colonoscopy 5 years ago Medicines:                Monitored Anesthesia Care Procedure:                Pre-Anesthesia Assessment:                           - Prior to the procedure, a History and Physical                            was performed, and patient medications and                            allergies were reviewed. The patient's tolerance of                            previous anesthesia was also reviewed. The risks                            and benefits of the procedure and the sedation                            options and risks were discussed with the patient.                            All questions were answered, and informed consent                            was obtained. Prior Anticoagulants: The patient has                            taken no previous anticoagulant or antiplatelet                            agents. ASA Grade Assessment: III - A patient with                            severe systemic disease. After reviewing the risks                            and benefits, the patient was deemed in  satisfactory condition to undergo the procedure.                           After obtaining informed consent, the colonoscope                            was passed under direct vision. Throughout the                            procedure, the patient's blood pressure, pulse, and                            oxygen saturations were monitored continuously. The                            Model CF-HQ190L [id number]) scope was  introduced                            through the anus and advanced to the the cecum,                            identified by appendiceal orifice and ileocecal                            valve. The colonoscopy was performed without                            difficulty. The patient tolerated the procedure                            well. The quality of the bowel preparation was                            good. The ileocecal valve, appendiceal orifice, and                            rectum were photographed. Scope In: 11:38:05 AM Scope Out: 11:51:42 AM Scope Withdrawal Time: 0 hours 11 minutes 12 seconds  Total Procedure Duration: 0 hours 13 minutes 37 seconds  Findings:                 The perianal and digital rectal examinations were                            normal.                           Two sessile polyps were found in the descending                            colon and ascending colon. The polyps were 5 to 6                            mm in size. These polyps were removed with a cold  snare. Resection and retrieval were complete.                           Scattered small and large-mouthed diverticula were                            found from ascending colon to sigmoid colon.                           Internal hemorrhoids were found during                            retroflexion. The hemorrhoids were small. Complications:            No immediate complications. Estimated Blood Loss:     Estimated blood loss was minimal. Impression:               - Two 5 to 6 mm polyps in the descending colon and                            in the ascending colon, removed with a cold snare.                            Resected and retrieved.                           - Mild diverticulosis from ascending colon to                            sigmoid colon.                           - Internal hemorrhoids. Recommendation:           - Patient has a contact number available for                             emergencies. The signs and symptoms of potential                            delayed complications were discussed with the                            patient. Return to normal activities tomorrow.                            Written discharge instructions were provided to the                            patient.                           - Resume previous diet.                           - Continue present medications.                           -  Await pathology results.                           - Repeat colonoscopy is recommended for                            surveillance. The colonoscopy date will be                            determined after pathology results from today's                            exam become available for review. Jerene Bears, MD 09/04/2015 11:55:39 AM This report has been signed electronically.

## 2015-09-04 NOTE — Progress Notes (Signed)
To recovery, report to Mirts, RN, VSS. 

## 2015-09-04 NOTE — Progress Notes (Signed)
Called to room to assist during endoscopic procedure.  Patient ID and intended procedure confirmed with present staff. Received instructions for my participation in the procedure from the performing physician.  

## 2015-09-05 ENCOUNTER — Telehealth: Payer: Self-pay | Admitting: *Deleted

## 2015-09-05 NOTE — Telephone Encounter (Signed)
  Follow up Call-  Call back number 09/04/2015  Post procedure Call Back phone  # [phone]  Permission to leave phone message Yes  Some recent data might be hidden     Patient questions:  Do you have a fever, pain , or abdominal swelling? No. Pain Score  0 *  Have you tolerated food without any problems? Yes.    Have you been able to return to your normal activities? Yes.    Do you have any questions about your discharge instructions: Diet   No. Medications  No. Follow up visit  No.  Do you have questions or concerns about your Care? No.  Actions: * If pain score is 4 or above: No action needed, pain <4.

## 2015-09-11 ENCOUNTER — Encounter: Payer: Self-pay | Admitting: Internal Medicine

## 2015-10-01 ENCOUNTER — Other Ambulatory Visit: Payer: Self-pay | Admitting: Family Medicine

## 2015-11-05 ENCOUNTER — Other Ambulatory Visit: Payer: Self-pay | Admitting: Family Medicine

## 2015-12-30 ENCOUNTER — Other Ambulatory Visit: Payer: Self-pay | Admitting: Family Medicine

## 2016-02-03 ENCOUNTER — Other Ambulatory Visit: Payer: Self-pay | Admitting: Physician Assistant

## 2016-02-03 ENCOUNTER — Other Ambulatory Visit: Payer: Self-pay | Admitting: Nurse Practitioner

## 2016-02-03 DIAGNOSIS — N529 Male erectile dysfunction, unspecified: Secondary | ICD-10-CM

## 2016-02-06 ENCOUNTER — Ambulatory Visit (INDEPENDENT_AMBULATORY_CARE_PROVIDER_SITE_OTHER): Payer: 59 | Admitting: Family Medicine

## 2016-02-06 ENCOUNTER — Encounter: Payer: Self-pay | Admitting: Family Medicine

## 2016-02-06 VITALS — BP 129/74 | HR 98 | Temp 99.0°F | Ht 67.0 in | Wt 234.6 lb

## 2016-02-06 DIAGNOSIS — E785 Hyperlipidemia, unspecified: Secondary | ICD-10-CM | POA: Diagnosis not present

## 2016-02-06 DIAGNOSIS — I10 Essential (primary) hypertension: Secondary | ICD-10-CM | POA: Diagnosis not present

## 2016-02-06 DIAGNOSIS — E119 Type 2 diabetes mellitus without complications: Secondary | ICD-10-CM | POA: Diagnosis not present

## 2016-02-06 LAB — BAYER DCA HB A1C WAIVED: HB A1C (BAYER DCA - WAIVED): 7.1 % — ABNORMAL HIGH (ref <7.0)

## 2016-02-06 NOTE — Progress Notes (Signed)
   Subjective:    Patient ID: Paul Williams, male    DOB: 11/16/1950, 66 y.o.   MRN: [id number]  HPI 66 year old gentleman who has diabetes, hyperlipidemia, and hypertension. He is very compliant and tries hard and that is reflected in his good blood pressure, A1c at target as well as lipids. He denies any new symptoms or problems. He has not been to the eye doctor in the last year.  Patient Active Problem List   Diagnosis Date Noted  . Hyperlipidemia   . Hypertension   . Diabetes mellitus without complication [facility])    Outpatient Encounter Prescriptions as of 02/06/2016  Medication Sig  . aspirin EC 81 MG tablet Take 81 mg by mouth daily.  Marland Kitchen atorvastatin (LIPITOR) 20 MG tablet TAKE ONE (1) TABLET EACH DAY  . Cholecalciferol (VITAMIN D3) 5000 UNITS CAPS Take 1 capsule by mouth daily.    . fish oil-omega-3 fatty acids 1000 MG capsule Take 1 g by mouth daily.    Marland Kitchen JANUVIA 50 MG tablet TAKE ONE (1) TABLET EACH DAY  . lisinopril-hydrochlorothiazide (PRINZIDE,ZESTORETIC) 20-12.5 MG tablet TAKE ONE (1) TABLET EACH DAY  . Multiple Vitamin (MULTIVITAMIN) tablet Take 1 tablet by mouth daily.  Marland Kitchen OVER THE COUNTER MEDICATION Take 250 mg by mouth 2 (two) times daily. Super beta prostate  . VIAGRA 100 MG tablet TAKE ONE TABLET AS NEEDED   Facility-Administered Encounter Medications as of 02/06/2016  Medication  . 0.9 %  sodium chloride infusion      Review of Systems  Constitutional: Negative.   HENT: Negative.   Eyes: Negative.   Respiratory: Negative.  Negative for shortness of breath.   Cardiovascular: Negative.  Negative for chest pain and leg swelling.  Gastrointestinal: Negative.   Genitourinary: Negative.   Musculoskeletal: Negative.   Skin: Negative.   Neurological: Negative.   Psychiatric/Behavioral: Negative.   All other systems reviewed and are negative.      Objective:   Physical Exam  Constitutional: He is oriented to person, place, and time. He appears well-developed and  well-nourished.  Cardiovascular: Normal rate, regular rhythm and normal heart sounds.   Pulmonary/Chest: Effort normal and breath sounds normal.  Neurological: He is alert and oriented to person, place, and time.  Psychiatric: He has a normal mood and affect. His behavior is normal.   BP 129/74   Pulse 98   Temp 99 F (37.2 C) (Oral)   Ht 5\' 7"  (1.702 m)   Wt 234 lb 9.6 oz (106.4 kg)   BMI 36.74 kg/m         Assessment & Plan:  1. Essential hypertension v well controlled continue with lisinopril hydrochlorothiazide  2. Diabetes mellitus without complication ([place]) Patient monitor sugars at home generally less than 120  - Bayer DCA Hb A1c Waived  3. Hyperlipidemia, unspecified hyperlipidemia type Lipids were last assessed last summer and were at goal. Will wait till next summer to repeat - PSA, total and free  Wardell Honour MD

## 2016-02-07 LAB — PSA, TOTAL AND FREE
PSA FREE PCT: 22.3 %
PSA FREE: 0.49 ng/mL
Prostate Specific Ag, Serum: 2.2 ng/mL (ref 0.0–4.0)

## 2016-03-04 ENCOUNTER — Other Ambulatory Visit: Payer: Self-pay | Admitting: Family Medicine

## 2016-05-19 ENCOUNTER — Telehealth: Payer: Self-pay | Admitting: Family Medicine

## 2016-05-19 DIAGNOSIS — R1032 Left lower quadrant pain: Secondary | ICD-10-CM

## 2016-05-19 NOTE — Telephone Encounter (Signed)
Per Dr. Warrick Parisian go ahead with referral to GI.  Contacted patient, received voicemail, left message to call back

## 2016-05-19 NOTE — Telephone Encounter (Signed)
[facility] him Dr. 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 Caryl Pina, MD [facility] 05/19/2016, 9:33 AM

## 2016-05-21 NOTE — Addendum Note (Signed)
Addended by: Michaela Corner on: 05/21/2016 09:33 AM   Modules accepted: Orders

## 2016-06-04 ENCOUNTER — Other Ambulatory Visit: Payer: Self-pay | Admitting: Family Medicine

## 2016-06-25 ENCOUNTER — Other Ambulatory Visit: Payer: Self-pay | Admitting: Family Medicine

## 2016-06-28 NOTE — Telephone Encounter (Signed)
Last seen 02/06/16  Dr Miller   

## 2016-07-02 ENCOUNTER — Encounter: Payer: Self-pay | Admitting: *Deleted

## 2016-07-08 ENCOUNTER — Encounter: Payer: Self-pay | Admitting: Internal Medicine

## 2016-07-08 ENCOUNTER — Ambulatory Visit (INDEPENDENT_AMBULATORY_CARE_PROVIDER_SITE_OTHER): Payer: 59 | Admitting: Internal Medicine

## 2016-07-08 VITALS — BP 128/72 | HR 84 | Ht 64.17 in | Wt 224.0 lb

## 2016-07-08 DIAGNOSIS — Z86010 Personal history of colonic polyps: Secondary | ICD-10-CM

## 2016-07-08 DIAGNOSIS — K5732 Diverticulitis of large intestine without perforation or abscess without bleeding: Secondary | ICD-10-CM | POA: Diagnosis not present

## 2016-07-08 NOTE — Patient Instructions (Signed)
If you are age 64 or older, your body mass index should be between 23-30. Your Body mass index is 38.24 kg/m. If this is out of the aforementioned range listed, please consider follow up with your Primary Care Provider.  If you are age 60 or younger, your body mass index should be between 19-25. Your Body mass index is 38.24 kg/m. If this is out of the aformentioned range listed, please consider follow up with your Primary Care Provider.

## 2016-07-08 NOTE — Progress Notes (Signed)
Patient ID: Paul Williams, male   DOB: 02-29-1950, 66 y.o.   MRN: [id number] HPI: Call Akamine is a 66 year old male with a past medical history of adenomatous colon polyps, colonic diverticulosis, hypertension, hyperlipidemia and diabetes who is seen in follow-up after being treated for what is felt to be diverticulitis about 6 weeks ago. He is known to me from his screening colonoscopy which was performed on 09/04/2015. This revealed 2 polyps removed with cold snare, 5-6 mm in size. There were scattered diverticulosis from ascending to sigmoid colon and internal hemorrhoids. These polyps were found to be tubular adenoma.  He reports that over about a week period In mid May 2018 he developed left lower quadrant abdominal discomfort. This was nagging and worse at night when he laid on his left side. It did bother him but to a low level during the daytime. No change in bowel habit. He went to the ER in [place], [place] and had a CT scan. He was told he had "inflammation" and was given an antibiotic for 10 days. He took this and completed it and symptoms have resolved entirely. He has no further abdominal pain. Bowel habits are regular. He denies diarrhea, constipation, blood in his stool or melena. Appetite is good. No nausea, vomiting, dysphagia or odynophagia.  CT scan results are not available to me at present.  Past Medical History:  Diagnosis Date  . Adenomatous polyp of colon   . Diabetes mellitus without complication ([place])   . Diverticulosis   . Hyperlipidemia   . Hypertension   . Internal hemorrhoids     Past Surgical History:  Procedure Laterality Date  . COLONOSCOPY    . top plate of teeth extracted      Outpatient Medications Prior to Visit  Medication Sig Dispense Refill  . aspirin EC 81 MG tablet Take 81 mg by mouth daily.    Marland Kitchen atorvastatin (LIPITOR) 40 MG tablet TAKE ONE (1) TABLET EACH DAY 30 tablet 4  . Cholecalciferol (VITAMIN D3) 5000 UNITS CAPS Take 1 capsule by mouth  daily.      . fish oil-omega-3 fatty acids 1000 MG capsule Take 1 g by mouth daily.      Marland Kitchen JANUVIA 50 MG tablet TAKE ONE (1) TABLET EACH DAY 30 tablet 0  . lisinopril-hydrochlorothiazide (PRINZIDE,ZESTORETIC) 20-12.5 MG tablet TAKE ONE (1) TABLET EACH DAY 90 tablet 0  . Multiple Vitamin (MULTIVITAMIN) tablet Take 1 tablet by mouth daily.    Marland Kitchen OVER THE COUNTER MEDICATION Take 250 mg by mouth 2 (two) times daily. Super beta prostate    . VIAGRA 100 MG tablet TAKE ONE TABLET AS NEEDED 10 tablet 0   Facility-Administered Medications Prior to Visit  Medication Dose Route Frequency Provider Last Rate Last Dose  . 0.9 %  sodium chloride infusion  500 mL Intravenous Continuous Mauriana Dann, Lajuan Lines, MD        No Known Allergies  Family History  Problem Relation Age of Onset  . Stroke Mother   . Diabetes Father   . Prostate cancer Father        prostate  . Diabetes Sister   . Diabetes Sister   . Colon cancer Neg Hx   . Esophageal cancer Neg Hx   . Rectal cancer Neg Hx   . Stomach cancer Neg Hx     Social History  Substance Use Topics  . Smoking status: Never Smoker  . Smokeless tobacco: Never Used  . Alcohol use Yes  Comment: occ    ROS: As per history of present illness, otherwise negative  BP 128/72   Pulse 84   Ht 5' 4.17" (1.63 m)   Wt 224 lb (101.6 kg)   BMI 38.24 kg/m  Constitutional: Well-developed and well-nourished. No distress. HEENT: Normocephalic and atraumatic. Oropharynx is clear and moist. Conjunctivae are normal.  No scleral icterus. Neck: Neck supple. Trachea midline. Cardiovascular: Normal rate, regular rhythm and intact distal pulses. No M/R/G Pulmonary/chest: Effort normal and breath sounds normal. No wheezing, rales or rhonchi. Abdominal: Soft, obese, diastases recti, nontender, nondistended. Bowel sounds active throughout. There are no masses palpable.  Extremities: no clubbing, cyanosis, or edema Neurological: Alert and oriented to person place and  time. Skin: Skin is warm and dry.  Psychiatric: Normal mood and affect. Behavior is normal.  ASSESSMENT/PLAN: 66 year old male with a past medical history of adenomatous colon polyps, colonic diverticulosis, hypertension, hyperlipidemia and diabetes who is seen in follow-up after being treated for what is felt to be diverticulitis about 6 weeks ago.   1. Resolved diverticulitis (presumed given I do not have CT scan performed at outside hospital) -- his symptoms sound like diverticulitis and responded fully to antibiotics. To this point this was uncomplicated. I have requested his ER record and CT scan for further review. In the interim we reviewed high fiber diet. I also recommended that he notify me immediately if he has recurrent left-sided abdominal pain. He voices understanding. I will notify him after reviewing the CT scan to determine if any further imaging or follow-up is necessary for this issue.  2. History of colon polyps -- surveillance colonoscopy at 5 year interval which would be August 2022  25 minutes spent with the patient today. Greater than 50% was spent in counseling and coordination of care with the patient   XB:[id number], Lillette Boxer, [place], [place] [id number]

## 2016-07-21 ENCOUNTER — Telehealth: Payer: Self-pay | Admitting: *Deleted

## 2016-07-21 DIAGNOSIS — R933 Abnormal findings on diagnostic imaging of other parts of digestive tract: Secondary | ICD-10-CM

## 2016-07-21 NOTE — Telephone Encounter (Signed)
I have spoken to patient to advise of Dr Vena Rua interpretation of CT as well as his recommendations. Patient verbalizes understanding. He has been advised that his appointment for CT at [facility] is scheduled for Tuesday, 07/27/16 @ 1 pm and that he will need labs prior to this as well as to pick up contrast. He states he will come for this tomorrow. Patient also advised of prep for CT which he also verbalizes understanding of.

## 2016-07-21 NOTE — Telephone Encounter (Signed)
Patient returned phone call. °

## 2016-07-21 NOTE — Telephone Encounter (Signed)
Dr Hilarie Fredrickson has looked over CT scan abd/pelvis which was completed at [facility] on 05-19-16. He states: "Patient seen in clinic. At that time, I presumed he was treated for diverticulitis, which after review of CT is not the case. He did have UTI, but also abnormality in pelvic mesentery. He has improved clinically. Would recommend repeat CT abd/pelvis with contrast to ensure resolution of abnormalities."  I have left a message with a male for patient to return my call.

## 2016-07-22 ENCOUNTER — Other Ambulatory Visit (INDEPENDENT_AMBULATORY_CARE_PROVIDER_SITE_OTHER): Payer: 59

## 2016-07-22 DIAGNOSIS — R933 Abnormal findings on diagnostic imaging of other parts of digestive tract: Secondary | ICD-10-CM

## 2016-07-22 LAB — BUN: BUN: 12 mg/dL (ref 6–23)

## 2016-07-22 LAB — CREATININE, SERUM: Creatinine, Ser: 1.45 mg/dL (ref 0.40–1.50)

## 2016-07-27 ENCOUNTER — Ambulatory Visit (INDEPENDENT_AMBULATORY_CARE_PROVIDER_SITE_OTHER)
Admission: RE | Admit: 2016-07-27 | Discharge: 2016-07-27 | Disposition: A | Discharge status: Home / Self Care | Payer: 59 | Source: Ambulatory Visit | Attending: Internal Medicine | Admitting: Internal Medicine

## 2016-07-27 DIAGNOSIS — R933 Abnormal findings on diagnostic imaging of other parts of digestive tract: Secondary | ICD-10-CM

## 2016-07-27 MED ORDER — IOPAMIDOL (ISOVUE-300) INJECTION 61%
100.0000 mL | Freq: Once | INTRAVENOUS | Status: AC | PRN
  Administered 2016-07-27: 100 mL via INTRAVENOUS

## 2016-07-28 ENCOUNTER — Other Ambulatory Visit: Payer: Self-pay | Admitting: Family Medicine

## 2016-08-05 ENCOUNTER — Ambulatory Visit (INDEPENDENT_AMBULATORY_CARE_PROVIDER_SITE_OTHER): Payer: 59 | Admitting: Family Medicine

## 2016-08-05 ENCOUNTER — Encounter: Payer: Self-pay | Admitting: Family Medicine

## 2016-08-05 VITALS — BP 130/88 | HR 97 | Temp 97.4°F | Ht 64.0 in | Wt 229.0 lb

## 2016-08-05 DIAGNOSIS — I10 Essential (primary) hypertension: Secondary | ICD-10-CM

## 2016-08-05 DIAGNOSIS — E785 Hyperlipidemia, unspecified: Secondary | ICD-10-CM

## 2016-08-05 DIAGNOSIS — Z1159 Encounter for screening for other viral diseases: Secondary | ICD-10-CM

## 2016-08-05 DIAGNOSIS — Z114 Encounter for screening for human immunodeficiency virus [HIV]: Secondary | ICD-10-CM | POA: Diagnosis not present

## 2016-08-05 DIAGNOSIS — Z23 Encounter for immunization: Secondary | ICD-10-CM

## 2016-08-05 DIAGNOSIS — E119 Type 2 diabetes mellitus without complications: Secondary | ICD-10-CM | POA: Diagnosis not present

## 2016-08-05 LAB — BAYER DCA HB A1C WAIVED: HB A1C: 6.6 % (ref <7.0)

## 2016-08-05 MED ORDER — LISINOPRIL-HYDROCHLOROTHIAZIDE 20-12.5 MG PO TABS: 1.0000 | ORAL_TABLET | Freq: Every day | ORAL | 3 refills | Status: DC

## 2016-08-05 MED ORDER — ATORVASTATIN CALCIUM 40 MG PO TABS: 40.0000 mg | ORAL_TABLET | Freq: Every day | ORAL | 3 refills | Status: DC

## 2016-08-05 MED ORDER — SITAGLIPTIN PHOSPHATE 50 MG PO TABS: 50.0000 mg | ORAL_TABLET | Freq: Every day | ORAL | 3 refills | Status: DC

## 2016-08-05 NOTE — Progress Notes (Signed)
BP 130/88   Pulse 97   Temp (!) 97.4 F (36.3 C) (Oral)   Ht 5' 4" (1.626 m)   Wt 229 lb (103.9 kg)   BMI 39.31 kg/m    Subjective:    Patient ID: Paul Williams, male    DOB: 1950/04/28, 66 y.o.   MRN: [id number]  HPI: Paul Williams is a 65 y.o. male presenting on 08/05/2016 for Diabetes (6 mo); Hyperlipidemia; and Hypertension   HPI Type 2 diabetes mellitus Patient comes in today for recheck of his diabetes. Patient has been currently taking januvia, BS over 100. Patient is currently on an ACE inhibitor/ARB. Patient has not seen an ophthalmologist this year. Patient denies any issues with their feet.   Hypertension Patient is currently on lisinopril-hydrochlorothiazide, and their blood pressure today is 130/88. Patient denies any lightheadedness or dizziness. Patient denies headaches, blurred vision, chest pains, shortness of breath, or weakness. Denies any side effects from medication and is content with current medication.   Hyperlipidemia Patient is coming in for recheck of his hyperlipidemia. The patient is currently taking fish oils and Lipitor. They deny any issues with myalgias or history of liver damage from it. They deny any focal numbness or weakness or chest pain.   Relevant past medical, surgical, family and social history reviewed and updated as indicated. Interim medical history since our last visit reviewed. Allergies and medications reviewed and updated.  Review of Systems  Constitutional: Negative for chills and fever.  Eyes: Negative for discharge.  Respiratory: Negative for shortness of breath and wheezing.   Cardiovascular: Negative for chest pain and leg swelling.  Gastrointestinal: Negative for abdominal pain.  Musculoskeletal: Negative for back pain and gait problem.  Skin: Negative for rash.  Neurological: Negative for dizziness, weakness, light-headedness, numbness and headaches.  All other systems reviewed and are negative.   Per HPI unless  specifically indicated above     Objective:    BP 130/88   Pulse 97   Temp (!) 97.4 F (36.3 C) (Oral)   Ht 5' 4" (1.626 m)   Wt 229 lb (103.9 kg)   BMI 39.31 kg/m   Wt Readings from Last 3 Encounters:  08/05/16 229 lb (103.9 kg)  07/08/16 224 lb (101.6 kg)  02/06/16 234 lb 9.6 oz (106.4 kg)    Physical Exam  Constitutional: He is oriented to person, place, and time. He appears well-developed and well-nourished. No distress.  Eyes: Conjunctivae are normal. No scleral icterus.  Neck: Neck supple. No thyromegaly present.  Cardiovascular: Normal rate, regular rhythm, normal heart sounds and intact distal pulses.   No murmur heard. Pulmonary/Chest: Effort normal and breath sounds normal. No respiratory distress. He has no wheezes.  Musculoskeletal: Normal range of motion. He exhibits no edema.  Lymphadenopathy:    He has no cervical adenopathy.  Neurological: He is alert and oriented to person, place, and time. Coordination normal.  Skin: Skin is warm and dry. No rash noted. He is not diaphoretic.  Psychiatric: He has a normal mood and affect. His behavior is normal.  Nursing note and vitals reviewed.     Assessment & Plan:   Problem List Items Addressed This Visit      Cardiovascular and Mediastinum   Hypertension - Primary   Relevant Medications   lisinopril-hydrochlorothiazide (PRINZIDE,ZESTORETIC) 20-12.5 MG tablet   atorvastatin (LIPITOR) 40 MG tablet   Other Relevant Orders   CMP14+EGFR   Microalbumin / creatinine urine ratio     Endocrine   Diabetes mellitus  without complication (HCC)   Relevant Medications   lisinopril-hydrochlorothiazide (PRINZIDE,ZESTORETIC) 20-12.5 MG tablet   sitaGLIPtin (JANUVIA) 50 MG tablet   atorvastatin (LIPITOR) 40 MG tablet   Other Relevant Orders   Bayer DCA Hb A1c Waived   CMP14+EGFR   Microalbumin / creatinine urine ratio     Other   Hyperlipidemia   Relevant Medications   lisinopril-hydrochlorothiazide  (PRINZIDE,ZESTORETIC) 20-12.5 MG tablet   atorvastatin (LIPITOR) 40 MG tablet   Other Relevant Orders   Lipid panel    Other Visit Diagnoses    Need for hepatitis C screening test       Relevant Orders   Hepatitis C antibody   Screening for HIV without presence of risk factors       Relevant Orders   HIV antibody       Follow up plan: Return in about 3 months (around 11/05/2016), or if symptoms worsen or fail to improve, for htn and dm.  Counseling provided for all of the vaccine components Orders Placed This Encounter  Procedures  . Bayer DCA Hb A1c Waived  . CMP14+EGFR  . Lipid panel  . Hepatitis C antibody  . HIV antibody  . Microalbumin / creatinine urine ratio    Caryl Pina, MD [facility] 08/05/2016, 10:06 AM

## 2016-08-06 LAB — CMP14+EGFR
A/G RATIO: 1.7 (ref 1.2–2.2)
ALBUMIN: 4.3 g/dL (ref 3.6–4.8)
ALK PHOS: 69 IU/L (ref 39–117)
ALT: 25 IU/L (ref 0–44)
AST: 28 IU/L (ref 0–40)
BUN / CREAT RATIO: 7 — AB (ref 10–24)
BUN: 10 mg/dL (ref 8–27)
Bilirubin Total: 0.7 mg/dL (ref 0.0–1.2)
CO2: 23 mmol/L (ref 20–29)
CREATININE: 1.46 mg/dL — AB (ref 0.76–1.27)
Calcium: 9.7 mg/dL (ref 8.6–10.2)
Chloride: 106 mmol/L (ref 96–106)
GFR calc Af Amer: 58 mL/min/{1.73_m2} — ABNORMAL LOW (ref >59)
GFR, EST NON AFRICAN AMERICAN: 50 mL/min/{1.73_m2} — AB (ref >59)
GLOBULIN, TOTAL: 2.5 g/dL (ref 1.5–4.5)
Glucose: 120 mg/dL — ABNORMAL HIGH (ref 65–99)
POTASSIUM: 4.5 mmol/L (ref 3.5–5.2)
SODIUM: 144 mmol/L (ref 134–144)
Total Protein: 6.8 g/dL (ref 6.0–8.5)

## 2016-08-06 LAB — LIPID PANEL
CHOL/HDL RATIO: 2.2 ratio (ref 0.0–5.0)
CHOLESTEROL TOTAL: 120 mg/dL (ref 100–199)
HDL: 55 mg/dL (ref >39)
LDL CALC: 51 mg/dL (ref 0–99)
TRIGLYCERIDES: 70 mg/dL (ref 0–149)
VLDL Cholesterol Cal: 14 mg/dL (ref 5–40)

## 2016-08-06 LAB — MICROALBUMIN / CREATININE URINE RATIO
CREATININE, UR: 230.2 mg/dL
MICROALBUM., U, RANDOM: 4.6 ug/mL
Microalb/Creat Ratio: 2 mg/g creat (ref 0.0–30.0)

## 2016-08-06 LAB — HEPATITIS C ANTIBODY: Hep C Virus Ab: <0.1 s/co ratio (ref 0.0–0.9)

## 2016-08-06 LAB — HIV ANTIBODY (ROUTINE TESTING W REFLEX): HIV SCREEN 4TH GENERATION: NONREACTIVE

## 2016-09-02 ENCOUNTER — Ambulatory Visit (INDEPENDENT_AMBULATORY_CARE_PROVIDER_SITE_OTHER): Payer: 59 | Admitting: *Deleted

## 2016-09-02 DIAGNOSIS — Z23 Encounter for immunization: Secondary | ICD-10-CM | POA: Diagnosis not present

## 2016-09-02 NOTE — Progress Notes (Signed)
Pt given Tdap vaccine Tolerated well 

## 2016-11-05 ENCOUNTER — Encounter: Payer: Self-pay | Admitting: Family Medicine

## 2016-11-05 ENCOUNTER — Ambulatory Visit (INDEPENDENT_AMBULATORY_CARE_PROVIDER_SITE_OTHER): Payer: 59 | Admitting: Family Medicine

## 2016-11-05 VITALS — BP 123/77 | HR 100 | Temp 98.0°F | Ht 64.0 in | Wt 231.0 lb

## 2016-11-05 DIAGNOSIS — E1159 Type 2 diabetes mellitus with other circulatory complications: Secondary | ICD-10-CM

## 2016-11-05 DIAGNOSIS — E785 Hyperlipidemia, unspecified: Secondary | ICD-10-CM | POA: Diagnosis not present

## 2016-11-05 DIAGNOSIS — N182 Chronic kidney disease, stage 2 (mild): Secondary | ICD-10-CM

## 2016-11-05 DIAGNOSIS — I10 Essential (primary) hypertension: Secondary | ICD-10-CM | POA: Diagnosis not present

## 2016-11-05 DIAGNOSIS — E1122 Type 2 diabetes mellitus with diabetic chronic kidney disease: Secondary | ICD-10-CM

## 2016-11-05 DIAGNOSIS — E1169 Type 2 diabetes mellitus with other specified complication: Secondary | ICD-10-CM

## 2016-11-05 LAB — BAYER DCA HB A1C WAIVED: HB A1C: 7.2 % — AB (ref <7.0)

## 2016-11-05 NOTE — Progress Notes (Signed)
BP 123/77   Pulse 100   Temp 98 F (36.7 C) (Oral)   Ht '5\' 4"'$  (1.626 m)   Wt 231 lb (104.8 kg)   BMI 39.65 kg/m    Subjective:    Patient ID: Paul Williams, male    DOB: 09-08-1950, 66 y.o.   MRN: [id number]  HPI: Paul Williams is a 66 y.o. male presenting on 11/05/2016 for Diabetes (3 mo follow up); Hyperlipidemia; and Hypertension   HPI Type 2 diabetes mellitus Patient comes in today for recheck of his diabetes. Patient has been currently taking Januvia. Patient is currently on an ACE inhibitor/ARB. Patient has not seen an ophthalmologist this year. Patient denies any issues with their feet.   Hyperlipidemia Patient is coming in for recheck of his hyperlipidemia. The patient is currently taking Lipitor. They deny any issues with myalgias or history of liver damage from it. They deny any focal numbness or weakness or chest pain.   Hypertension Patient is currently on lisinopril-hydrochlorothiazide, and their blood pressure today is 123/77. Patient denies any lightheadedness or dizziness. Patient denies headaches, blurred vision, chest pains, shortness of breath, or weakness. Denies any side effects from medication and is content with current medication.   Relevant past medical, surgical, family and social history reviewed and updated as indicated. Interim medical history since our last visit reviewed. Allergies and medications reviewed and updated.  Review of Systems  Constitutional: Negative for chills and fever.  Eyes: Negative for discharge.  Respiratory: Negative for shortness of breath and wheezing.   Cardiovascular: Negative for chest pain and leg swelling.  Musculoskeletal: Negative for back pain and gait problem.  Skin: Negative for rash.  Neurological: Negative for dizziness, weakness, light-headedness and numbness.  All other systems reviewed and are negative.   Per HPI unless specifically indicated above   Allergies as of 11/05/2016   No Known Allergies       Medication List       Accurate as of 11/05/16 10:08 AM. Always use your most recent med list.          aspirin EC 81 MG tablet Take 81 mg by mouth daily.   atorvastatin 40 MG tablet Commonly known as:  LIPITOR Take 1 tablet (40 mg total) by mouth daily at 6 PM.   fish oil-omega-3 fatty acids 1000 MG capsule Take 1 g by mouth daily.   lisinopril-hydrochlorothiazide 20-12.5 MG tablet Commonly known as:  PRINZIDE,ZESTORETIC Take 1 tablet by mouth daily.   multivitamin tablet Take 1 tablet by mouth daily.   OVER THE COUNTER MEDICATION Take 250 mg by mouth 2 (two) times daily. Super beta prostate   sitaGLIPtin 50 MG tablet Commonly known as:  JANUVIA Take 1 tablet (50 mg total) by mouth daily.   VIAGRA 100 MG tablet Generic drug:  sildenafil TAKE ONE TABLET AS NEEDED   Vitamin D3 5000 units Caps Take 1 capsule by mouth daily.          Objective:    BP 123/77   Pulse 100   Temp 98 F (36.7 C) (Oral)   Ht '5\' 4"'$  (1.626 m)   Wt 231 lb (104.8 kg)   BMI 39.65 kg/m   Wt Readings from Last 3 Encounters:  11/05/16 231 lb (104.8 kg)  08/05/16 229 lb (103.9 kg)  07/08/16 224 lb (101.6 kg)    Physical Exam  Constitutional: He is oriented to person, place, and time. He appears well-developed and well-nourished. No distress.  Eyes: Conjunctivae are normal. No  scleral icterus.  Neck: Neck supple. No thyromegaly present.  Cardiovascular: Normal rate, regular rhythm, normal heart sounds and intact distal pulses.   No murmur heard. Pulmonary/Chest: Effort normal and breath sounds normal. No respiratory distress. He has no wheezes. He has no rales.  Musculoskeletal: Normal range of motion. He exhibits no edema.  Lymphadenopathy:    He has no cervical adenopathy.  Neurological: He is alert and oriented to person, place, and time. Coordination normal.  Skin: Skin is warm and dry. No rash noted. He is not diaphoretic.  Psychiatric: He has a normal mood and affect. His  behavior is normal.  Nursing note and vitals reviewed.   Results for orders placed or performed in visit on 08/05/16  Bayer [place] Hb A1c Waived  Result Value Ref Range   Bayer DCA Hb A1c Waived 6.6 <7.0 %  CMP14+EGFR  Result Value Ref Range   Glucose 120 (H) 65 - 99 mg/dL   BUN 10 8 - 27 mg/dL   Creatinine, Ser 1.46 (H) 0.76 - 1.27 mg/dL   GFR calc non Af Amer 50 (L) >59 mL/min/1.73   GFR calc Af Amer 58 (L) >59 mL/min/1.73   BUN/Creatinine Ratio 7 (L) 10 - 24   Sodium 144 134 - 144 mmol/L   Potassium 4.5 3.5 - 5.2 mmol/L   Chloride 106 96 - 106 mmol/L   CO2 23 20 - 29 mmol/L   Calcium 9.7 8.6 - 10.2 mg/dL   Total Protein 6.8 6.0 - 8.5 g/dL   Albumin 4.3 3.6 - 4.8 g/dL   Globulin, Total 2.5 1.5 - 4.5 g/dL   Albumin/Globulin Ratio 1.7 1.2 - 2.2   Bilirubin Total 0.7 0.0 - 1.2 mg/dL   Alkaline Phosphatase 69 39 - 117 IU/L   AST 28 0 - 40 IU/L   ALT 25 0 - 44 IU/L  Lipid panel  Result Value Ref Range   Cholesterol, Total 120 100 - 199 mg/dL   Triglycerides 70 0 - 149 mg/dL   HDL 55 >39 mg/dL   VLDL Cholesterol Cal 14 5 - 40 mg/dL   LDL Calculated 51 0 - 99 mg/dL   Chol/HDL Ratio 2.2 0.0 - 5.0 ratio  Hepatitis C antibody  Result Value Ref Range   Hep C Virus Ab <0.1 0.0 - 0.9 s/co ratio  HIV antibody  Result Value Ref Range   HIV Screen 4th Generation wRfx Non Reactive Non Reactive  Microalbumin / creatinine urine ratio  Result Value Ref Range   Creatinine, Urine 230.2 Not Estab. mg/dL   Albumin, Urine 4.6 Not Estab. ug/mL   Microalb/Creat Ratio 2.0 0.0 - 30.0 mg/g creat      Assessment & Plan:   Problem List Items Addressed This Visit      Cardiovascular and Mediastinum   Hypertension associated with diabetes ([place])   Relevant Orders   BMP8+EGFR     Endocrine   Hyperlipidemia associated with type 2 diabetes mellitus (HCC)   Type 2 diabetes mellitus with diabetic chronic kidney disease ([place]) - Primary   Relevant Orders   BMP8+EGFR   Bayer DCA Hb A1c Waived         Follow up plan: Return in about 3 months (around 02/05/2017), or if symptoms worsen or fail to improve, for Diabetes and cholesterol.  Counseling provided for all of the vaccine components Orders Placed This Encounter  Procedures  . BMP8+EGFR  . Bayer [facility] Hb A1c Waived    Caryl Pina, MD [place]  11/05/2016, 10:08 AM

## 2016-11-06 LAB — BMP8+EGFR
BUN/Creatinine Ratio: 10 (ref 10–24)
BUN: 20 mg/dL (ref 8–27)
CALCIUM: 10 mg/dL (ref 8.6–10.2)
CO2: 25 mmol/L (ref 20–29)
CREATININE: 1.94 mg/dL — AB (ref 0.76–1.27)
Chloride: 98 mmol/L (ref 96–106)
GFR calc Af Amer: 41 mL/min/{1.73_m2} — ABNORMAL LOW (ref >59)
GFR calc non Af Amer: 36 mL/min/{1.73_m2} — ABNORMAL LOW (ref >59)
GLUCOSE: 134 mg/dL — AB (ref 65–99)
Potassium: 4.2 mmol/L (ref 3.5–5.2)
Sodium: 138 mmol/L (ref 134–144)

## 2016-11-08 ENCOUNTER — Other Ambulatory Visit: Payer: Self-pay

## 2016-11-08 DIAGNOSIS — N289 Disorder of kidney and ureter, unspecified: Secondary | ICD-10-CM

## 2016-12-30 ENCOUNTER — Other Ambulatory Visit (HOSPITAL_COMMUNITY): Payer: Self-pay | Admitting: Nephrology

## 2016-12-30 DIAGNOSIS — N183 Chronic kidney disease, stage 3 unspecified: Secondary | ICD-10-CM

## 2017-01-18 ENCOUNTER — Ambulatory Visit (HOSPITAL_COMMUNITY)
Admission: RE | Admit: 2017-01-18 | Discharge: 2017-01-18 | Disposition: A | Discharge status: Home / Self Care | Payer: 59 | Source: Ambulatory Visit | Attending: Nephrology | Admitting: Nephrology

## 2017-01-18 DIAGNOSIS — N281 Cyst of kidney, acquired: Secondary | ICD-10-CM | POA: Insufficient documentation

## 2017-01-18 DIAGNOSIS — N183 Chronic kidney disease, stage 3 unspecified: Secondary | ICD-10-CM

## 2017-01-18 DIAGNOSIS — N3289 Other specified disorders of bladder: Secondary | ICD-10-CM | POA: Diagnosis not present

## 2017-02-07 ENCOUNTER — Encounter: Payer: Self-pay | Admitting: Family Medicine

## 2017-02-07 ENCOUNTER — Ambulatory Visit: Payer: 59 | Admitting: Family Medicine

## 2017-02-07 VITALS — BP 135/81 | HR 101 | Temp 97.8°F | Ht 64.0 in | Wt 226.0 lb

## 2017-02-07 DIAGNOSIS — N183 Chronic kidney disease, stage 3 unspecified: Secondary | ICD-10-CM

## 2017-02-07 DIAGNOSIS — E1169 Type 2 diabetes mellitus with other specified complication: Secondary | ICD-10-CM | POA: Diagnosis not present

## 2017-02-07 DIAGNOSIS — E785 Hyperlipidemia, unspecified: Secondary | ICD-10-CM | POA: Diagnosis not present

## 2017-02-07 DIAGNOSIS — E1159 Type 2 diabetes mellitus with other circulatory complications: Secondary | ICD-10-CM | POA: Diagnosis not present

## 2017-02-07 DIAGNOSIS — I10 Essential (primary) hypertension: Secondary | ICD-10-CM | POA: Diagnosis not present

## 2017-02-07 DIAGNOSIS — I152 Hypertension secondary to endocrine disorders: Secondary | ICD-10-CM

## 2017-02-07 DIAGNOSIS — E1122 Type 2 diabetes mellitus with diabetic chronic kidney disease: Secondary | ICD-10-CM | POA: Diagnosis not present

## 2017-02-07 LAB — LIPID PANEL
CHOLESTEROL TOTAL: 90 mg/dL — AB (ref 100–199)
Chol/HDL Ratio: 1.8 ratio (ref 0.0–5.0)
HDL: 49 mg/dL (ref >39)
LDL Calculated: 29 mg/dL (ref 0–99)
TRIGLYCERIDES: 58 mg/dL (ref 0–149)
VLDL CHOLESTEROL CAL: 12 mg/dL (ref 5–40)

## 2017-02-07 LAB — BAYER DCA HB A1C WAIVED: HB A1C: 6.6 % (ref <7.0)

## 2017-02-07 NOTE — Progress Notes (Signed)
BP 135/81   Pulse (!) 101   Temp 97.8 F (36.6 C) (Oral)   Ht '5\' 4"'$  (1.626 m)   Wt 226 lb (102.5 kg)   BMI 38.79 kg/m    Subjective:    Patient ID: Paul Williams, male    DOB: [birth year], 67 y.o.   MRN: [id number]  HPI: Paul Williams is a 67 y.o. male presenting on 02/07/2017 for Diabetes; Hyperlipidemia; and Hypertension   HPI Hyperlipidemia Patient is coming in for recheck of his hyperlipidemia. The patient is currently taking Lipitor and fish oil. They deny any issues with myalgias or history of liver damage from it. They deny any focal numbness or weakness or chest pain.   Hypertension Patient is currently on lisinopril-hydrochlorothiazide, and their blood pressure today is 135/81. Patient denies any lightheadedness or dizziness. Patient denies headaches, blurred vision, chest pains, shortness of breath, or weakness. Denies any side effects from medication and is content with current medication.   Type 2 diabetes mellitus Patient comes in today for recheck of his diabetes. Patient has been currently taking Januvia. Patient is currently on an ACE inhibitor/ARB. Patient has not seen an ophthalmologist this year. Patient denies any issues with their feet.   Relevant past medical, surgical, family and social history reviewed and updated as indicated. Interim medical history since our last visit reviewed. Allergies and medications reviewed and updated.  Review of Systems  Constitutional: Negative for chills and fever.  Eyes: Negative for discharge.  Respiratory: Negative for shortness of breath and wheezing.   Cardiovascular: Negative for chest pain and leg swelling.  Musculoskeletal: Negative for back pain and gait problem.  Skin: Negative for rash.  Neurological: Negative for dizziness, weakness, light-headedness and numbness.  All other systems reviewed and are negative.   Per HPI unless specifically indicated above   Allergies as of 02/07/2017   No Known Allergies       Medication List        Accurate as of 02/07/17 10:57 AM. Always use your most recent med list.          aspirin EC 81 MG tablet Take 81 mg by mouth daily.   atorvastatin 40 MG tablet Commonly known as:  LIPITOR Take 1 tablet (40 mg total) by mouth daily at 6 PM.   fish oil-omega-3 fatty acids 1000 MG capsule Take 1 g by mouth daily.   lisinopril-hydrochlorothiazide 20-12.5 MG tablet Commonly known as:  PRINZIDE,ZESTORETIC Take 1 tablet by mouth daily.   multivitamin tablet Take 1 tablet by mouth daily.   OVER THE COUNTER MEDICATION Take 250 mg by mouth 2 (two) times daily. Super beta prostate   sitaGLIPtin 50 MG tablet Commonly known as:  JANUVIA Take 1 tablet (50 mg total) by mouth daily.   VIAGRA 100 MG tablet Generic drug:  sildenafil TAKE ONE TABLET AS NEEDED   Vitamin D3 5000 units Caps Take 1 capsule by mouth daily.          Objective:    BP 135/81   Pulse (!) 101   Temp 97.8 F (36.6 C) (Oral)   Ht '5\' 4"'$  (1.626 m)   Wt 226 lb (102.5 kg)   BMI 38.79 kg/m   Wt Readings from Last 3 Encounters:  02/07/17 226 lb (102.5 kg)  11/05/16 231 lb (104.8 kg)  08/05/16 229 lb (103.9 kg)    Physical Exam  Constitutional: He is oriented to person, place, and time. He appears well-developed and well-nourished. No distress.  Eyes: Conjunctivae are  normal. No scleral icterus.  Neck: Neck supple. No thyromegaly present.  Cardiovascular: Normal rate, regular rhythm, normal heart sounds and intact distal pulses.  No murmur heard. Pulmonary/Chest: Effort normal and breath sounds normal. No respiratory distress. He has no wheezes.  Musculoskeletal: Normal range of motion. He exhibits no edema.  Lymphadenopathy:    He has no cervical adenopathy.  Neurological: He is alert and oriented to person, place, and time. Coordination normal.  Skin: Skin is warm and dry. No rash noted. He is not diaphoretic.  Psychiatric: He has a normal mood and affect. His behavior is  normal.  Nursing note and vitals reviewed.       Assessment & Plan:   Problem List Items Addressed This Visit      Cardiovascular and Mediastinum   Hypertension associated with diabetes ([place]) - Primary     Endocrine   Hyperlipidemia associated with type 2 diabetes mellitus ([place])   Relevant Orders   Lipid panel (Completed)   Type 2 diabetes mellitus with diabetic chronic kidney disease ([place])   Relevant Orders   Bayer DCA Hb A1c Waived (Completed)      Patient is following up with renal numbers with his nephrologist and has done testing with them recently and sees him every 3 months right now.  Follow up plan: Return in about 3 months (around 05/07/2017), or if symptoms worsen or fail to improve, for Recheck diabetes.  Counseling provided for all of the vaccine components Orders Placed This Encounter  Procedures  . CMP14+EGFR  . Lipid panel  . Bayer [facility] Hb A1c [place], MD [facility] 02/07/2017, 10:57 AM

## 2017-03-07 ENCOUNTER — Other Ambulatory Visit: Payer: Self-pay | Admitting: Nurse Practitioner

## 2017-03-07 DIAGNOSIS — N529 Male erectile dysfunction, unspecified: Secondary | ICD-10-CM

## 2017-03-08 NOTE — Telephone Encounter (Signed)
Last seen 02/07/17  Dr D

## 2017-04-11 ENCOUNTER — Other Ambulatory Visit: Payer: Self-pay | Admitting: Family Medicine

## 2017-04-11 DIAGNOSIS — N529 Male erectile dysfunction, unspecified: Secondary | ICD-10-CM

## 2017-04-18 ENCOUNTER — Other Ambulatory Visit (HOSPITAL_BASED_OUTPATIENT_CLINIC_OR_DEPARTMENT_OTHER): Payer: Self-pay

## 2017-04-18 DIAGNOSIS — G4733 Obstructive sleep apnea (adult) (pediatric): Secondary | ICD-10-CM

## 2017-04-26 ENCOUNTER — Ambulatory Visit: Payer: 59 | Attending: Neurology | Admitting: Neurology

## 2017-04-26 DIAGNOSIS — Z79899 Other long term (current) drug therapy: Secondary | ICD-10-CM | POA: Insufficient documentation

## 2017-04-26 DIAGNOSIS — R0683 Snoring: Secondary | ICD-10-CM | POA: Diagnosis not present

## 2017-04-26 DIAGNOSIS — Z7982 Long term (current) use of aspirin: Secondary | ICD-10-CM | POA: Insufficient documentation

## 2017-04-26 DIAGNOSIS — G4733 Obstructive sleep apnea (adult) (pediatric): Secondary | ICD-10-CM | POA: Diagnosis present

## 2017-04-26 DIAGNOSIS — I493 Ventricular premature depolarization: Secondary | ICD-10-CM | POA: Diagnosis not present

## 2017-05-02 NOTE — Procedures (Signed)
[place] A. Merlene Laughter, MD     [email or web address]             NOCTURNAL POLYSOMNOGRAPHY   LOCATION: [facility]  Patient Name: Paul Williams, Paul Williams Date: 04/26/2017 Gender: Male D.O.B: [birth year] Age (years): 37 Referring Provider: Barton Fanny NP Height (inches): 64 Interpreting Physician: Phillips Odor MD, ABSM Weight (lbs): 226 RPSGT: Rosebud Poles BMI: 39 MRN: [id number] Neck Size: 18.00 <br> <br> CLINICAL INFORMATION Sleep Study Type: NPSG    Indication for sleep study: N/A    Epworth Sleepiness Score: 7    SLEEP STUDY TECHNIQUE As per the AASM Manual for the Scoring of Sleep and Associated Events v2.3 (April 2016) with a hypopnea requiring 4% desaturations.  The channels recorded and monitored were frontal, central and occipital EEG, electrooculogram (EOG), submentalis EMG (chin), nasal and oral airflow, thoracic and abdominal wall motion, anterior tibialis EMG, snore microphone, electrocardiogram, and pulse oximetry.  MEDICATIONS Medications self-administered by patient taken the night of the study : N/A  Current Outpatient Medications:  .  aspirin EC 81 MG tablet, Take 81 mg by mouth daily., Disp: , Rfl:  .  atorvastatin (LIPITOR) 40 MG tablet, Take 1 tablet (40 mg total) by mouth daily at 6 PM., Disp: 90 tablet, Rfl: 3 .  Cholecalciferol (VITAMIN D3) 5000 UNITS CAPS, Take 1 capsule by mouth daily.  , Disp: , Rfl:  .  fish oil-omega-3 fatty acids 1000 MG capsule, Take 1 g by mouth daily.  , Disp: , Rfl:  .  lisinopril-hydrochlorothiazide (PRINZIDE,ZESTORETIC) 20-12.5 MG tablet, Take 1 tablet by mouth daily., Disp: 90 tablet, Rfl: 3 .  Multiple Vitamin (MULTIVITAMIN) tablet, Take 1 tablet by mouth daily., Disp: , Rfl:  .  OVER THE COUNTER MEDICATION, Take 250 mg by mouth 2 (two) times daily. Super beta prostate, Disp: , Rfl:  .  sitaGLIPtin (JANUVIA) 50 MG tablet, Take 1 tablet (50 mg total) by mouth daily., Disp: 90 tablet, Rfl: 3 .   VIAGRA 100 MG tablet, TAKE ONE TABLET AS NEEDED, Disp: 10 tablet, Rfl: 0  Current Facility-Administered Medications:  .  0.9 %  sodium chloride infusion, 500 mL, Intravenous, Continuous, Pyrtle, Lajuan Lines, MD    SLEEP ARCHITECTURE The study was initiated at 11:01:03 PM and ended at 5:29:40 AM.  Sleep onset time was 165.2 minutes and the sleep efficiency was 43.5%%. The total sleep time was 169 minutes.  Stage REM latency was 165.0 minutes.  The patient spent 6.8%% of the night in stage N1 sleep, 63.3%% in stage N2 sleep, 15.4%% in stage N3 and 14.50% in REM.  Alpha intrusion was absent.  Supine sleep was 100.00%.  RESPIRATORY PARAMETERS The overall apnea/hypopnea index (AHI) was 32.7 per hour. There were 52 total apneas, including 52 obstructive, 0 central and 0 mixed apneas. There were 40 hypopneas and 0 RERAs.  The AHI during Stage REM sleep was 63.7 per hour.  AHI while supine was 32.7 per hour.  The mean oxygen saturation was 91.4%. The minimum SpO2 during sleep was 75.0%.  moderate snoring was noted during this study.  CARDIAC DATA The 2 lead EKG demonstrated sinus rhythm. The mean heart rate was 80.7 beats per minute. Other EKG findings include: PVCs. LEG MOVEMENT DATA The total PLMS were 0 with a resulting PLMS index of 0.0. Associated arousal with leg movement index was 0.0.  IMPRESSIONS - Moderate obstructive sleep apnea is documented during this recording. Auto Pap 8-14 is recommended. - Abnormal so architecture with a markedly reduced  sleep efficiency is observed.     Delano Metz, MD Diplomate, American Board of Sleep Medicine.  ELECTRONICALLY SIGNED ON:  05/02/2017, 3:27 PM [place] PH: [phone]   FX: [phone] [place]

## 2017-05-11 DIAGNOSIS — R809 Proteinuria, unspecified: Secondary | ICD-10-CM | POA: Diagnosis not present

## 2017-05-11 DIAGNOSIS — N183 Chronic kidney disease, stage 3 (moderate): Secondary | ICD-10-CM | POA: Diagnosis not present

## 2017-05-11 DIAGNOSIS — I10 Essential (primary) hypertension: Secondary | ICD-10-CM | POA: Diagnosis not present

## 2017-05-11 DIAGNOSIS — D649 Anemia, unspecified: Secondary | ICD-10-CM | POA: Diagnosis not present

## 2017-05-12 ENCOUNTER — Ambulatory Visit: Payer: 59 | Admitting: Family Medicine

## 2017-05-12 ENCOUNTER — Encounter: Payer: Self-pay | Admitting: Family Medicine

## 2017-05-12 VITALS — BP 130/85 | HR 90 | Temp 97.5°F | Ht 64.0 in | Wt 232.0 lb

## 2017-05-12 DIAGNOSIS — E119 Type 2 diabetes mellitus without complications: Secondary | ICD-10-CM | POA: Diagnosis not present

## 2017-05-12 DIAGNOSIS — N183 Chronic kidney disease, stage 3 unspecified: Secondary | ICD-10-CM

## 2017-05-12 DIAGNOSIS — E1122 Type 2 diabetes mellitus with diabetic chronic kidney disease: Secondary | ICD-10-CM | POA: Diagnosis not present

## 2017-05-12 DIAGNOSIS — E1169 Type 2 diabetes mellitus with other specified complication: Secondary | ICD-10-CM

## 2017-05-12 DIAGNOSIS — I10 Essential (primary) hypertension: Secondary | ICD-10-CM

## 2017-05-12 DIAGNOSIS — E785 Hyperlipidemia, unspecified: Secondary | ICD-10-CM

## 2017-05-12 DIAGNOSIS — E1159 Type 2 diabetes mellitus with other circulatory complications: Secondary | ICD-10-CM

## 2017-05-12 LAB — BAYER DCA HB A1C WAIVED: HB A1C (BAYER DCA - WAIVED): 6.2 % (ref <7.0)

## 2017-05-12 MED ORDER — SITAGLIPTIN PHOSPHATE 50 MG PO TABS: 50.0000 mg | ORAL_TABLET | Freq: Every day | ORAL | 3 refills | Status: DC

## 2017-05-12 NOTE — Progress Notes (Signed)
BP 130/85   Pulse 90   Temp (!) 97.5 F (36.4 C) (Oral)   Ht 5\' 4"  (1.626 m)   Wt 232 lb (105.2 kg)   BMI 39.82 kg/m    Subjective:    Patient ID: Paul Williams, male    DOB: 1950/05/11, 67 y.o.   MRN: [id number]  HPI: Isrrael Fluckiger is a 67 y.o. male presenting on 05/12/2017 for Diabetes (3 mo); Hyperlipidemia; and Hypertension   HPI Type 2 diabetes mellitus Patient comes in today for recheck of his diabetes. Patient has been currently taking Januvia. Patient is currently on an ACE inhibitor/ARB. Patient has seen an ophthalmologist this year and they said he did well.. Patient denies any issues with their feet.  Patient has chronic known CKD  Hypertension Patient is currently on lisinopril-hydrochlorothiazide, and their blood pressure today is 130/85. Patient denies any lightheadedness or dizziness. Patient denies headaches, blurred vision, chest pains, shortness of breath, or weakness. Denies any side effects from medication and is content with current medication.   Hyperlipidemia Patient is coming in for recheck of his hyperlipidemia. The patient is currently taking Lipitor. They deny any issues with myalgias or history of liver damage from it. They deny any focal numbness or weakness or chest pain.   Relevant past medical, surgical, family and social history reviewed and updated as indicated. Interim medical history since our last visit reviewed. Allergies and medications reviewed and updated.  Review of Systems  Constitutional: Negative for chills and fever.  Respiratory: Negative for shortness of breath and wheezing.   Cardiovascular: Negative for chest pain and leg swelling.  Musculoskeletal: Negative for back pain and gait problem.  Skin: Negative for rash.  Neurological: Negative for dizziness, weakness and numbness.  All other systems reviewed and are negative.   Per HPI unless specifically indicated above   Allergies as of 05/12/2017   No Known Allergies       Medication List        Accurate as of 05/12/17 10:07 AM. Always use your most recent med list.          aspirin EC 81 MG tablet Take 81 mg by mouth daily.   atorvastatin 40 MG tablet Commonly known as:  LIPITOR Take 1 tablet (40 mg total) by mouth daily at 6 PM.   fish oil-omega-3 fatty acids 1000 MG capsule Take 1 g by mouth daily.   lisinopril-hydrochlorothiazide 20-12.5 MG tablet Commonly known as:  PRINZIDE,ZESTORETIC Take 1 tablet by mouth daily.   multivitamin tablet Take 1 tablet by mouth daily.   OVER THE COUNTER MEDICATION Take 250 mg by mouth 2 (two) times daily. Super beta prostate   sitaGLIPtin 50 MG tablet Commonly known as:  JANUVIA Take 1 tablet (50 mg total) by mouth daily.   VIAGRA 100 MG tablet Generic drug:  sildenafil TAKE ONE TABLET AS NEEDED   Vitamin D3 5000 units Caps Take 1 capsule by mouth daily.          Objective:    BP 130/85   Pulse 90   Temp (!) 97.5 F (36.4 C) (Oral)   Ht 5\' 4"  (1.626 m)   Wt 232 lb (105.2 kg)   BMI 39.82 kg/m   Wt Readings from Last 3 Encounters:  05/12/17 232 lb (105.2 kg)  02/07/17 226 lb (102.5 kg)  11/05/16 231 lb (104.8 kg)    Physical Exam  Constitutional: He is oriented to person, place, and time. He appears well-developed and well-nourished. No distress.  Eyes: Conjunctivae are normal. No scleral icterus.  Neck: Neck supple. No thyromegaly present.  Cardiovascular: Normal rate, regular rhythm, normal heart sounds and intact distal pulses.  No murmur heard. Pulmonary/Chest: Effort normal and breath sounds normal. No respiratory distress. He has no wheezes.  Musculoskeletal: Normal range of motion. He exhibits no edema.  Lymphadenopathy:    He has no cervical adenopathy.  Neurological: He is alert and oriented to person, place, and time. Coordination normal.  Skin: Skin is warm and dry. No rash noted. He is not diaphoretic.  Psychiatric: He has a normal mood and affect. His behavior is  normal.  Nursing note and vitals reviewed.   Results for orders placed or performed in visit on 02/07/17  Lipid panel  Result Value Ref Range   Cholesterol, Total 90 (L) 100 - 199 mg/dL   Triglycerides 58 0 - 149 mg/dL   HDL 49 >39 mg/dL   VLDL Cholesterol Cal 12 5 - 40 mg/dL   LDL Calculated 29 0 - 99 mg/dL   Chol/HDL Ratio 1.8 0.0 - 5.0 ratio  Bayer DCA Hb A1c Waived  Result Value Ref Range   Bayer DCA Hb A1c Waived 6.6 <7.0 %      Assessment & Plan:   Problem List Items Addressed This Visit      Cardiovascular and Mediastinum   Hypertension associated with diabetes ([place])   Relevant Medications   sitaGLIPtin (JANUVIA) 50 MG tablet   Other Relevant Orders   Bayer DCA Hb A1c Waived     Endocrine   Hyperlipidemia associated with type 2 diabetes mellitus ([facility])   Relevant Medications   sitaGLIPtin (JANUVIA) 50 MG tablet   Type 2 diabetes mellitus with diabetic chronic kidney disease ([facility]) - Primary   Relevant Medications   sitaGLIPtin (JANUVIA) 50 MG tablet   Other Relevant Orders   Bayer DCA Hb A1c Waived    Other Visit Diagnoses    Diabetes mellitus without complication ([facility])       Relevant Medications   sitaGLIPtin (JANUVIA) 50 MG tablet      Had 10 panel checked with nephrology, creatinine was 1.7 and estimated GFR was 48.  We will continue on his Januvia as he has been doing very well on it and check his A1c again today. Follow up plan: Return in about 3 months (around 08/12/2017), or if symptoms worsen or fail to improve, for Diabetes recheck.  Counseling provided for all of the vaccine components Orders Placed This Encounter  Procedures  . Bayer [facility] Hb A1c [place], MD [facility] 05/12/2017, 10:07 AM

## 2017-06-03 ENCOUNTER — Other Ambulatory Visit: Payer: Self-pay | Admitting: Family Medicine

## 2017-06-03 DIAGNOSIS — N529 Male erectile dysfunction, unspecified: Secondary | ICD-10-CM

## 2017-06-15 ENCOUNTER — Other Ambulatory Visit: Payer: Self-pay | Admitting: Family Medicine

## 2017-06-15 DIAGNOSIS — I10 Essential (primary) hypertension: Secondary | ICD-10-CM

## 2017-06-30 DIAGNOSIS — G4733 Obstructive sleep apnea (adult) (pediatric): Secondary | ICD-10-CM | POA: Diagnosis not present

## 2017-06-30 DIAGNOSIS — I10 Essential (primary) hypertension: Secondary | ICD-10-CM | POA: Diagnosis not present

## 2017-06-30 DIAGNOSIS — E1121 Type 2 diabetes mellitus with diabetic nephropathy: Secondary | ICD-10-CM | POA: Diagnosis not present

## 2017-06-30 DIAGNOSIS — E6609 Other obesity due to excess calories: Secondary | ICD-10-CM | POA: Diagnosis not present

## 2017-08-09 DIAGNOSIS — G4733 Obstructive sleep apnea (adult) (pediatric): Secondary | ICD-10-CM | POA: Diagnosis not present

## 2017-08-10 DIAGNOSIS — N183 Chronic kidney disease, stage 3 (moderate): Secondary | ICD-10-CM | POA: Diagnosis not present

## 2017-08-10 DIAGNOSIS — D509 Iron deficiency anemia, unspecified: Secondary | ICD-10-CM | POA: Diagnosis not present

## 2017-08-10 DIAGNOSIS — Z79899 Other long term (current) drug therapy: Secondary | ICD-10-CM | POA: Diagnosis not present

## 2017-08-10 DIAGNOSIS — I10 Essential (primary) hypertension: Secondary | ICD-10-CM | POA: Diagnosis not present

## 2017-08-10 DIAGNOSIS — E559 Vitamin D deficiency, unspecified: Secondary | ICD-10-CM | POA: Diagnosis not present

## 2017-08-10 DIAGNOSIS — R809 Proteinuria, unspecified: Secondary | ICD-10-CM | POA: Diagnosis not present

## 2017-08-12 ENCOUNTER — Encounter: Payer: Self-pay | Admitting: Family Medicine

## 2017-08-12 ENCOUNTER — Ambulatory Visit (INDEPENDENT_AMBULATORY_CARE_PROVIDER_SITE_OTHER): Payer: Medicare Other | Admitting: Family Medicine

## 2017-08-12 VITALS — BP 110/71 | HR 100 | Temp 98.6°F | Ht 64.0 in | Wt 224.2 lb

## 2017-08-12 DIAGNOSIS — E1169 Type 2 diabetes mellitus with other specified complication: Secondary | ICD-10-CM | POA: Diagnosis not present

## 2017-08-12 DIAGNOSIS — E785 Hyperlipidemia, unspecified: Secondary | ICD-10-CM | POA: Diagnosis not present

## 2017-08-12 DIAGNOSIS — I152 Hypertension secondary to endocrine disorders: Secondary | ICD-10-CM

## 2017-08-12 DIAGNOSIS — I10 Essential (primary) hypertension: Secondary | ICD-10-CM | POA: Diagnosis not present

## 2017-08-12 DIAGNOSIS — M1712 Unilateral primary osteoarthritis, left knee: Secondary | ICD-10-CM | POA: Diagnosis not present

## 2017-08-12 DIAGNOSIS — E1159 Type 2 diabetes mellitus with other circulatory complications: Secondary | ICD-10-CM

## 2017-08-12 DIAGNOSIS — E1122 Type 2 diabetes mellitus with diabetic chronic kidney disease: Secondary | ICD-10-CM

## 2017-08-12 DIAGNOSIS — N183 Chronic kidney disease, stage 3 (moderate): Secondary | ICD-10-CM | POA: Diagnosis not present

## 2017-08-12 MED ORDER — LISINOPRIL-HYDROCHLOROTHIAZIDE 20-12.5 MG PO TABS: ORAL_TABLET | ORAL | 3 refills | Status: DC

## 2017-08-12 MED ORDER — SITAGLIPTIN PHOSPHATE 50 MG PO TABS: 50.0000 mg | ORAL_TABLET | Freq: Every day | ORAL | 3 refills | Status: DC

## 2017-08-12 MED ORDER — ATORVASTATIN CALCIUM 40 MG PO TABS: 40.0000 mg | ORAL_TABLET | Freq: Every day | ORAL | 3 refills | Status: DC

## 2017-08-12 NOTE — Progress Notes (Signed)
BP 110/71   Pulse 100   Temp 98.6 F (37 C) (Oral)   Ht _0  (1.626 m)   Wt 224 lb 3.2 oz (101.7 kg)   BMI 38.48 kg/m    Subjective:    Patient ID: Paul Williams, male    DOB: 1950-09-07, 67 y.o.   MRN: [id number]  HPI: Paul Williams is a 67 y.o. male presenting on 08/12/2017 for Diabetes (3 month follow up); Hypertension; and Knee Pain (Left x 2 weeks)   HPI Type 2 diabetes mellitus Patient comes in today for recheck of his diabetes. Patient has been currently taking Januvia. Patient is currently on an ACE inhibitor/ARB. Patient has not seen an ophthalmologist this year. Patient denies any issues with their feet.   Hypertension Patient is currently on lisinopril-hydrochlorothiazide, and their blood pressure today is 110/71. Patient denies any lightheadedness or dizziness. Patient denies headaches, blurred vision, chest pains, shortness of breath, or weakness. Denies any side effects from medication and is content with current medication.   Hyperlipidemia Patient is coming in for recheck of his hyperlipidemia. The patient is currently taking Lipitor. They deny any issues with myalgias or history of liver damage from it. They deny any focal numbness or weakness or chest pain.   Osteoarthritis left knee, mild Patient is having arthritis of the left knee that is mild at this point and only gets stiff but he just wanted to mention it just to get it looked at.  Mainly gets stiff when he is first getting up and going but then it improves and is able to walk without much issue.  He is not taking anything for it currently.  Relevant past medical, surgical, family and social history reviewed and updated as indicated. Interim medical history since our last visit reviewed. Allergies and medications reviewed and updated.  Review of Systems  Constitutional: Negative for chills and fever.  Eyes: Negative for discharge.  Respiratory: Negative for shortness of breath and wheezing.   Cardiovascular:  Negative for chest pain and leg swelling.  Musculoskeletal: Negative for back pain and gait problem.  Skin: Negative for rash.  Neurological: Negative for dizziness, weakness, light-headedness and numbness.  All other systems reviewed and are negative.   Per HPI unless specifically indicated above   Allergies as of 08/12/2017   No Known Allergies     Medication List        Accurate as of 08/12/17 10:36 AM. Always use your most recent med list.          aspirin EC 81 MG tablet Take 81 mg by mouth daily.   atorvastatin 40 MG tablet Commonly known as:  LIPITOR Take 1 tablet (40 mg total) by mouth daily at 6 PM.   fish oil-omega-3 fatty acids 1000 MG capsule Take 1 g by mouth daily.   lisinopril-hydrochlorothiazide 20-12.5 MG tablet Commonly known as:  PRINZIDE,ZESTORETIC TAKE ONE (1) TABLET EACH DAY   multivitamin tablet Take 1 tablet by mouth daily.   OVER THE COUNTER MEDICATION Take 250 mg by mouth 2 (two) times daily. Super beta prostate   sildenafil 100 MG tablet Commonly known as:  VIAGRA TAKE ONE TABLET AS NEEDED   sitaGLIPtin 50 MG tablet Commonly known as:  JANUVIA Take 1 tablet (50 mg total) by mouth daily.   Vitamin D3 5000 units Caps Take 1 capsule by mouth daily.          Objective:    BP 110/71   Pulse 100   Temp 98.6  F (37 C) (Oral)   Ht _0  (1.626 m)   Wt 224 lb 3.2 oz (101.7 kg)   BMI 38.48 kg/m   Wt Readings from Last 3 Encounters:  08/12/17 224 lb 3.2 oz (101.7 kg)  05/12/17 232 lb (105.2 kg)  02/07/17 226 lb (102.5 kg)    Physical Exam  Constitutional: He is oriented to person, place, and time. He appears well-developed and well-nourished. No distress.  Eyes: Conjunctivae are normal. No scleral icterus.  Neck: Neck supple. No thyromegaly present.  Cardiovascular: Normal rate, regular rhythm, normal heart sounds and intact distal pulses.  No murmur heard. Pulmonary/Chest: Effort normal and breath sounds normal. No respiratory  distress. He has no wheezes.  Musculoskeletal: Normal range of motion. He exhibits no edema.  Lymphadenopathy:    He has no cervical adenopathy.  Neurological: He is alert and oriented to person, place, and time. Coordination normal.  Skin: Skin is warm and dry. No rash noted. He is not diaphoretic.  Psychiatric: He has a normal mood and affect. His behavior is normal.  Nursing note and vitals reviewed.   Results for orders placed or performed in visit on 05/12/17  Bayer DCA Hb A1c Waived  Result Value Ref Range   HB A1C (BAYER DCA - WAIVED) 6.2 <7.0 %      Assessment & Plan:   Problem List Items Addressed This Visit      Cardiovascular and Mediastinum   Hypertension associated with diabetes ([place])   Relevant Medications   lisinopril-hydrochlorothiazide (PRINZIDE,ZESTORETIC) 20-12.5 MG tablet   atorvastatin (LIPITOR) 40 MG tablet   sitaGLIPtin (JANUVIA) 50 MG tablet   Other Relevant Orders   CBC with Differential/Platelet   CMP14+EGFR     Endocrine   Hyperlipidemia associated with type 2 diabetes mellitus (HCC)   Relevant Medications   lisinopril-hydrochlorothiazide (PRINZIDE,ZESTORETIC) 20-12.5 MG tablet   atorvastatin (LIPITOR) 40 MG tablet   sitaGLIPtin (JANUVIA) 50 MG tablet   Other Relevant Orders   Lipid panel   Type 2 diabetes mellitus with diabetic chronic kidney disease ([place]) - Primary   Relevant Medications   lisinopril-hydrochlorothiazide (PRINZIDE,ZESTORETIC) 20-12.5 MG tablet   atorvastatin (LIPITOR) 40 MG tablet   sitaGLIPtin (JANUVIA) 50 MG tablet   Other Relevant Orders   Bayer DCA Hb A1c Waived   CBC with Differential/Platelet   CMP14+EGFR     Other   Morbid obesity (HCC)   Relevant Medications   sitaGLIPtin (JANUVIA) 50 MG tablet    Other Visit Diagnoses    Arthritis of left knee        Continue Januvia and Lipitor and lisinopril hydrochlorthiazide.  We will continue to monitor his renal function and he continues to see nephrology.  For the  arthritis of the left knee just recommended Tylenol arthritis as needed and talk to [place] again if it worsens.  Follow up plan: Return in about 3 months (around 11/12/2017), or if symptoms worsen or fail to improve, for Recheck diabetes and hypertension.  Counseling provided for all of the vaccine components Orders Placed This Encounter  Procedures  . Bayer DCA Hb A1c Waived  . CBC with Differential/Platelet  . CMP14+EGFR  . Lipid panel    Caryl Pina, MD [facility] 08/12/2017, 10:36 AM

## 2017-08-13 LAB — CBC WITH DIFFERENTIAL/PLATELET
BASOS: 0 %
Basophils Absolute: 0 10*3/uL (ref 0.0–0.2)
EOS (ABSOLUTE): 0.2 10*3/uL (ref 0.0–0.4)
EOS: 2 %
HEMOGLOBIN: 15.5 g/dL (ref 13.0–17.7)
Hematocrit: 45.3 % (ref 37.5–51.0)
Immature Grans (Abs): 0 10*3/uL (ref 0.0–0.1)
Immature Granulocytes: 0 %
Lymphocytes Absolute: 2.4 10*3/uL (ref 0.7–3.1)
Lymphs: 29 %
MCH: 30.3 pg (ref 26.6–33.0)
MCHC: 34.2 g/dL (ref 31.5–35.7)
MCV: 89 fL (ref 79–97)
MONOCYTES: 7 %
MONOS ABS: 0.6 10*3/uL (ref 0.1–0.9)
NEUTROS ABS: 5.2 10*3/uL (ref 1.4–7.0)
Neutrophils: 62 %
Platelets: 279 10*3/uL (ref 150–450)
RBC: 5.12 x10E6/uL (ref 4.14–5.80)
RDW: 14.3 % (ref 12.3–15.4)
WBC: 8.3 10*3/uL (ref 3.4–10.8)

## 2017-08-13 LAB — CMP14+EGFR
A/G RATIO: 1.7 (ref 1.2–2.2)
ALBUMIN: 4.6 g/dL (ref 3.6–4.8)
ALK PHOS: 77 IU/L (ref 39–117)
ALT: 21 IU/L (ref 0–44)
AST: 22 IU/L (ref 0–40)
BUN / CREAT RATIO: 9 — AB (ref 10–24)
BUN: 16 mg/dL (ref 8–27)
Bilirubin Total: 1 mg/dL (ref 0.0–1.2)
CO2: 21 mmol/L (ref 20–29)
CREATININE: 1.78 mg/dL — AB (ref 0.76–1.27)
Calcium: 10.4 mg/dL — ABNORMAL HIGH (ref 8.6–10.2)
Chloride: 103 mmol/L (ref 96–106)
GFR calc Af Amer: 45 mL/min/{1.73_m2} — ABNORMAL LOW (ref >59)
GFR, EST NON AFRICAN AMERICAN: 39 mL/min/{1.73_m2} — AB (ref >59)
Globulin, Total: 2.7 g/dL (ref 1.5–4.5)
Glucose: 152 mg/dL — ABNORMAL HIGH (ref 65–99)
POTASSIUM: 4.2 mmol/L (ref 3.5–5.2)
SODIUM: 141 mmol/L (ref 134–144)
Total Protein: 7.3 g/dL (ref 6.0–8.5)

## 2017-08-13 LAB — LIPID PANEL
CHOL/HDL RATIO: 2.1 ratio (ref 0.0–5.0)
CHOLESTEROL TOTAL: 112 mg/dL (ref 100–199)
HDL: 54 mg/dL (ref >39)
LDL Calculated: 44 mg/dL (ref 0–99)
TRIGLYCERIDES: 70 mg/dL (ref 0–149)
VLDL Cholesterol Cal: 14 mg/dL (ref 5–40)

## 2017-08-15 DIAGNOSIS — N183 Chronic kidney disease, stage 3 (moderate): Secondary | ICD-10-CM | POA: Diagnosis not present

## 2017-08-15 DIAGNOSIS — E669 Obesity, unspecified: Secondary | ICD-10-CM | POA: Diagnosis not present

## 2017-08-15 DIAGNOSIS — G4733 Obstructive sleep apnea (adult) (pediatric): Secondary | ICD-10-CM | POA: Diagnosis not present

## 2017-08-16 LAB — BAYER DCA HB A1C WAIVED: HB A1C: 6 % (ref <7.0)

## 2017-08-24 ENCOUNTER — Other Ambulatory Visit: Payer: Self-pay | Admitting: *Deleted

## 2017-08-24 DIAGNOSIS — E1122 Type 2 diabetes mellitus with diabetic chronic kidney disease: Secondary | ICD-10-CM

## 2017-08-24 DIAGNOSIS — N183 Chronic kidney disease, stage 3 (moderate): Principal | ICD-10-CM

## 2017-08-24 MED ORDER — SITAGLIPTIN PHOSPHATE 100 MG PO TABS: 100.0000 mg | ORAL_TABLET | Freq: Every day | ORAL | 1 refills | Status: DC

## 2017-08-24 NOTE — Progress Notes (Signed)
Dose change per Dr Dettinger due to cost

## 2017-11-14 DIAGNOSIS — E559 Vitamin D deficiency, unspecified: Secondary | ICD-10-CM | POA: Diagnosis not present

## 2017-11-14 DIAGNOSIS — Z1159 Encounter for screening for other viral diseases: Secondary | ICD-10-CM | POA: Diagnosis not present

## 2017-11-14 DIAGNOSIS — N183 Chronic kidney disease, stage 3 (moderate): Secondary | ICD-10-CM | POA: Diagnosis not present

## 2017-11-14 DIAGNOSIS — D509 Iron deficiency anemia, unspecified: Secondary | ICD-10-CM | POA: Diagnosis not present

## 2017-11-14 DIAGNOSIS — Z79899 Other long term (current) drug therapy: Secondary | ICD-10-CM | POA: Diagnosis not present

## 2017-11-14 DIAGNOSIS — R809 Proteinuria, unspecified: Secondary | ICD-10-CM | POA: Diagnosis not present

## 2017-11-15 ENCOUNTER — Ambulatory Visit (INDEPENDENT_AMBULATORY_CARE_PROVIDER_SITE_OTHER): Payer: Medicare Other | Admitting: Family Medicine

## 2017-11-15 ENCOUNTER — Encounter: Payer: Self-pay | Admitting: Family Medicine

## 2017-11-15 VITALS — BP 137/78 | HR 89 | Temp 97.1°F | Ht 64.0 in | Wt 230.0 lb

## 2017-11-15 DIAGNOSIS — E1169 Type 2 diabetes mellitus with other specified complication: Secondary | ICD-10-CM

## 2017-11-15 DIAGNOSIS — E1122 Type 2 diabetes mellitus with diabetic chronic kidney disease: Secondary | ICD-10-CM

## 2017-11-15 DIAGNOSIS — N183 Chronic kidney disease, stage 3 (moderate): Secondary | ICD-10-CM

## 2017-11-15 DIAGNOSIS — I10 Essential (primary) hypertension: Secondary | ICD-10-CM | POA: Diagnosis not present

## 2017-11-15 DIAGNOSIS — E1159 Type 2 diabetes mellitus with other circulatory complications: Secondary | ICD-10-CM

## 2017-11-15 DIAGNOSIS — E785 Hyperlipidemia, unspecified: Secondary | ICD-10-CM | POA: Diagnosis not present

## 2017-11-15 DIAGNOSIS — I152 Hypertension secondary to endocrine disorders: Secondary | ICD-10-CM

## 2017-11-15 LAB — BAYER DCA HB A1C WAIVED: HB A1C (BAYER DCA - WAIVED): 6.8 % (ref <7.0)

## 2017-11-15 NOTE — Progress Notes (Signed)
BP 137/78   Pulse 89   Temp (!) 97.1 F (36.2 C) (Oral)   Ht '5\' 4"'$  (1.626 m)   Wt 230 lb (104.3 kg)   BMI 39.48 kg/m    Subjective:    Patient ID: Paul Williams, male    DOB: 08/30/1950, 67 y.o.   MRN: [id number]  HPI: Gagan Dillion is a 67 y.o. male presenting on 11/15/2017 for Diabetes (3 month followup); Hypertension; and Hyperlipidemia   HPI Type 2 diabetes mellitus Patient comes in today for recheck of his diabetes. Patient has been currently taking Januvia. Patient is currently on an ACE inhibitor/ARB. Patient has not seen an ophthalmologist this year. Patient denies any issues with their feet.   Hypertension Patient is currently on lisinopril hydrochlorothiazide, and their blood pressure today is 137/78. Patient denies any lightheadedness or dizziness. Patient denies headaches, blurred vision, chest pains, shortness of breath, or weakness. Denies any side effects from medication and is content with current medication.   Hyperlipidemia Patient is coming in for recheck of his hyperlipidemia. The patient is currently taking fish oil. They deny any issues with myalgias or history of liver damage from it. They deny any focal numbness or weakness or chest pain.   Relevant past medical, surgical, family and social history reviewed and updated as indicated. Interim medical history since our last visit reviewed. Allergies and medications reviewed and updated.  Review of Systems  Constitutional: Negative for chills and fever.  Eyes: Negative for visual disturbance.  Respiratory: Negative for shortness of breath and wheezing.   Cardiovascular: Negative for chest pain and leg swelling.  Musculoskeletal: Negative for back pain and gait problem.  Skin: Negative for rash.  Neurological: Negative for dizziness, weakness, light-headedness and numbness.  All other systems reviewed and are negative.   Per HPI unless specifically indicated above   Allergies as of 11/15/2017   No Known  Allergies     Medication List        Accurate as of 11/15/17  9:51 AM. Always use your most recent med list.          aspirin EC 81 MG tablet Take 81 mg by mouth daily.   atorvastatin 40 MG tablet Commonly known as:  LIPITOR Take 1 tablet (40 mg total) by mouth daily at 6 PM.   fish oil-omega-3 fatty acids 1000 MG capsule Take 1 g by mouth daily.   lisinopril-hydrochlorothiazide 20-12.5 MG tablet Commonly known as:  PRINZIDE,ZESTORETIC TAKE ONE (1) TABLET EACH DAY   multivitamin tablet Take 1 tablet by mouth daily.   OVER THE COUNTER MEDICATION Take 250 mg by mouth 2 (two) times daily. Super beta prostate   sildenafil 100 MG tablet Commonly known as:  VIAGRA TAKE ONE TABLET AS NEEDED   sitaGLIPtin 100 MG tablet Commonly known as:  JANUVIA Take 1 tablet (100 mg total) by mouth daily.   Vitamin D3 125 MCG (5000 UT) Caps Take 1 capsule by mouth daily.          Objective:    BP 137/78   Pulse 89   Temp (!) 97.1 F (36.2 C) (Oral)   Ht '5\' 4"'$  (1.626 m)   Wt 230 lb (104.3 kg)   BMI 39.48 kg/m   Wt Readings from Last 3 Encounters:  11/15/17 230 lb (104.3 kg)  08/12/17 224 lb 3.2 oz (101.7 kg)  05/12/17 232 lb (105.2 kg)    Physical Exam  Constitutional: He is oriented to person, place, and time. He appears well-developed  and well-nourished. No distress.  Eyes: Conjunctivae are normal. No scleral icterus.  Neck: Neck supple. No thyromegaly present.  Cardiovascular: Normal rate, regular rhythm, normal heart sounds and intact distal pulses.  No murmur heard. Pulmonary/Chest: Effort normal and breath sounds normal. No respiratory distress. He has no wheezes.  Musculoskeletal: Normal range of motion. He exhibits no edema.  Lymphadenopathy:    He has no cervical adenopathy.  Neurological: He is alert and oriented to person, place, and time. Coordination normal.  Skin: Skin is warm and dry. No rash noted. He is not diaphoretic.  Psychiatric: He has a normal  mood and affect. His behavior is normal.  Nursing note and vitals reviewed.     Assessment & Plan:   Problem List Items Addressed This Visit      Cardiovascular and Mediastinum   Hypertension associated with diabetes ([place])     Endocrine   Hyperlipidemia associated with type 2 diabetes mellitus ([place])   Type 2 diabetes mellitus with diabetic chronic kidney disease ([place]) - Primary   Relevant Orders   BMP8+EGFR   Bayer DCA Hb A1c Waived     Other   Morbid obesity ([place])       Follow up plan: Return in about 3 months (around 02/15/2018), or if symptoms worsen or fail to improve, for Diabetes and hypertension.  Counseling provided for all of the vaccine components Orders Placed This Encounter  Procedures  . BMP8+EGFR  . [facility] Hb A1c Waived    Caryl Pina, MD [facility] 11/15/2017, 9:51 AM

## 2017-11-16 LAB — BMP8+EGFR
BUN / CREAT RATIO: 7 — AB (ref 10–24)
BUN: 11 mg/dL (ref 8–27)
CALCIUM: 10.1 mg/dL (ref 8.6–10.2)
CHLORIDE: 104 mmol/L (ref 96–106)
CO2: 23 mmol/L (ref 20–29)
Creatinine, Ser: 1.49 mg/dL — ABNORMAL HIGH (ref 0.76–1.27)
GFR calc Af Amer: 56 mL/min/{1.73_m2} — ABNORMAL LOW (ref >59)
GFR calc non Af Amer: 49 mL/min/{1.73_m2} — ABNORMAL LOW (ref >59)
GLUCOSE: 129 mg/dL — AB (ref 65–99)
Potassium: 4.2 mmol/L (ref 3.5–5.2)
Sodium: 141 mmol/L (ref 134–144)

## 2017-11-23 DIAGNOSIS — N183 Chronic kidney disease, stage 3 (moderate): Secondary | ICD-10-CM | POA: Diagnosis not present

## 2017-11-23 DIAGNOSIS — D649 Anemia, unspecified: Secondary | ICD-10-CM | POA: Diagnosis not present

## 2017-11-23 DIAGNOSIS — E1129 Type 2 diabetes mellitus with other diabetic kidney complication: Secondary | ICD-10-CM | POA: Diagnosis not present

## 2017-11-23 DIAGNOSIS — R809 Proteinuria, unspecified: Secondary | ICD-10-CM | POA: Diagnosis not present

## 2017-11-29 IMAGING — CT CT ABD-PELV W/ CM
2 of 5 series · 16 of 46 positions shown, 18 images · IV contrast (iopamidol)
Comparison: CT the abdomen and pelvis 05/19/2016.

CLINICAL DATA: 66-year-old male with generalize abdominal
tenderness since May 2016. Pain improved after administration of
antibiotics.

EXAM:
CT ABDOMEN AND PELVIS WITH CONTRAST
TECHNIQUE: Multidetector CT imaging of the abdomen and pelvis was performed
using the standard protocol following bolus administration of
intravenous contrast.
CONTRAST:  100mL 6B7KB8-AHH IOPAMIDOL (6B7KB8-AHH) INJECTION 61%

[Series 2: abd/pel w · axial · 0.90mm/px · z∈[-396,+4]mm · 13 of 90 slices shown, 15 images]
[im 5/90  soft-tissue]
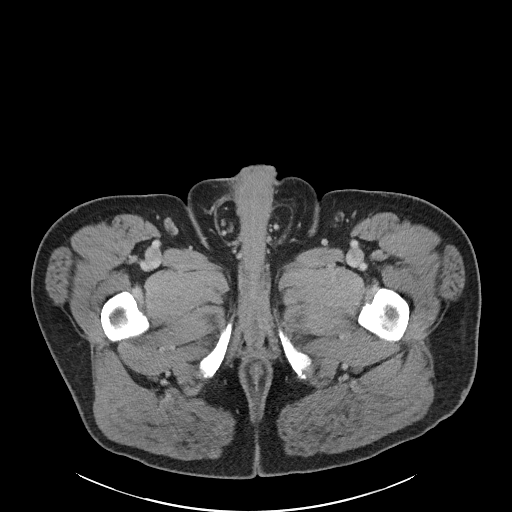
[im 5/90  bone]
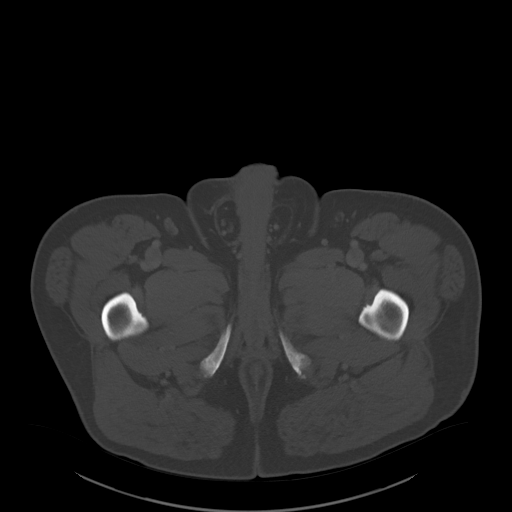
[im 10/90  soft-tissue]
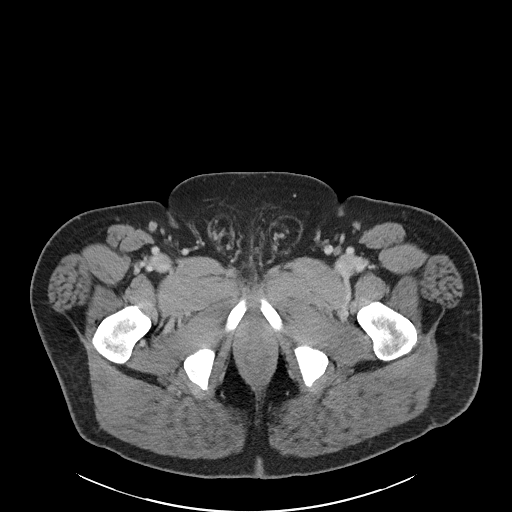
[im 20/90  soft-tissue]
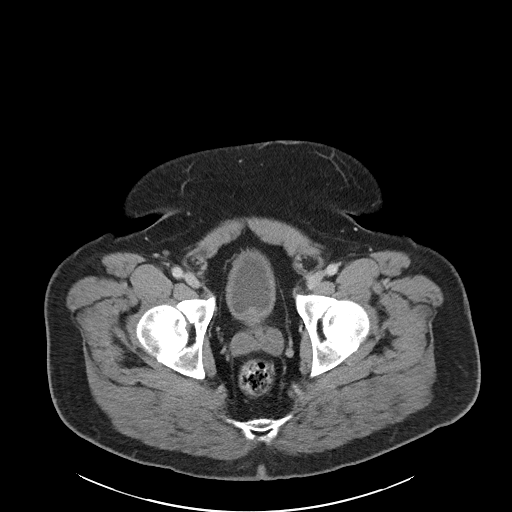
[im 25/90  soft-tissue]
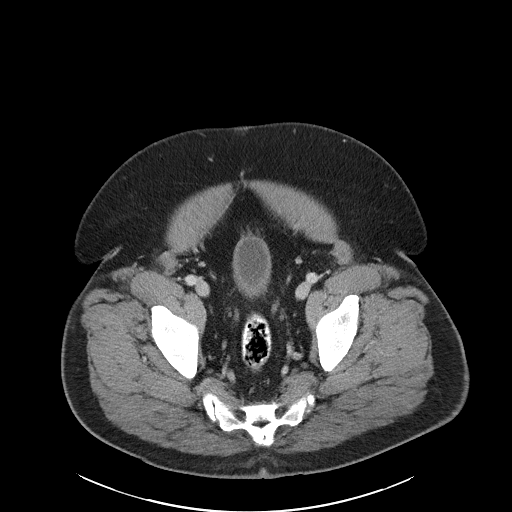
[im 30/90  soft-tissue]
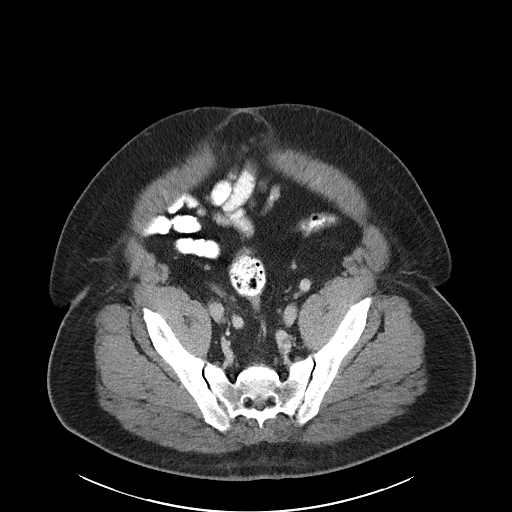
[im 40/90  soft-tissue]
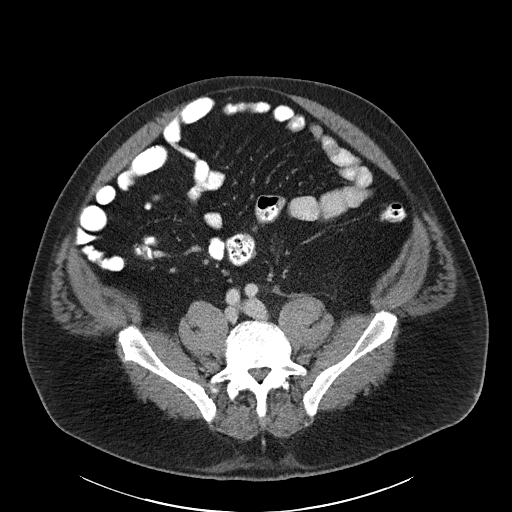
[im 45/90  soft-tissue]
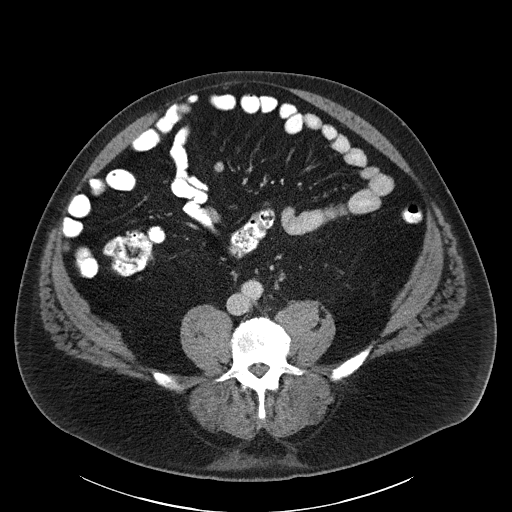
[im 50/90  soft-tissue]
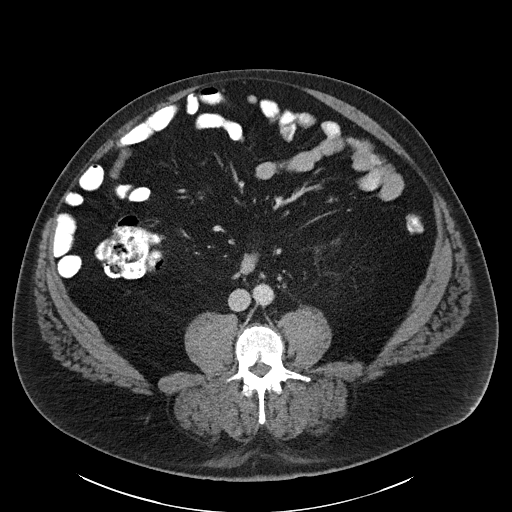
[im 60/90  soft-tissue]
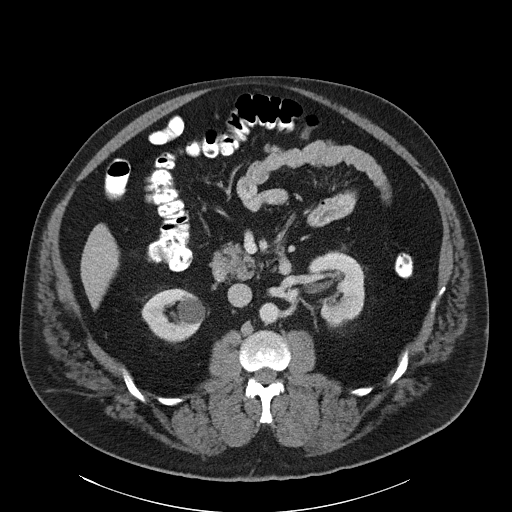
[im 60/90  bone]
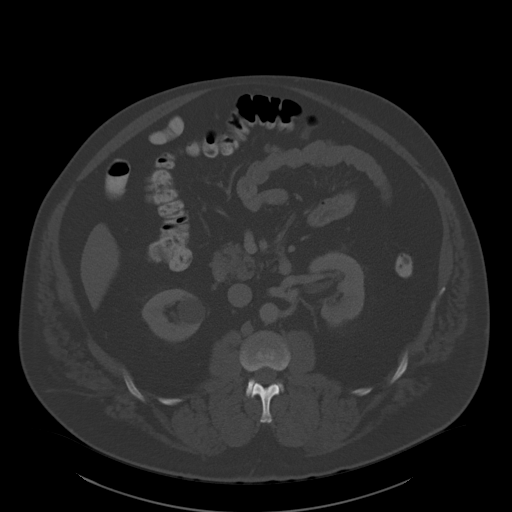
[im 65/90  soft-tissue]
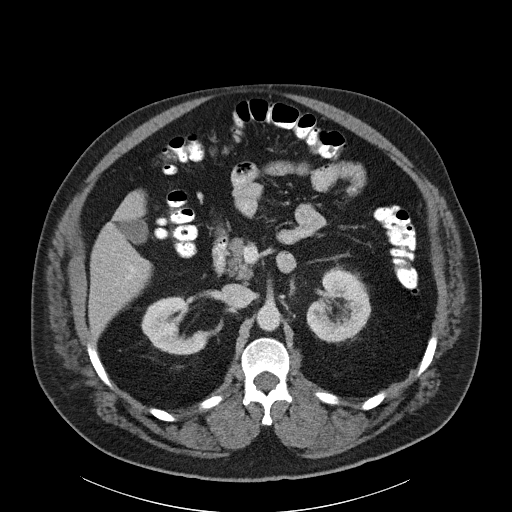
[im 70/90  soft-tissue]
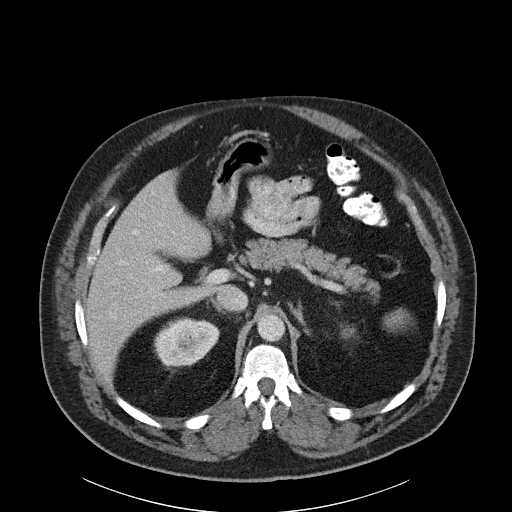
[im 80/90  soft-tissue]
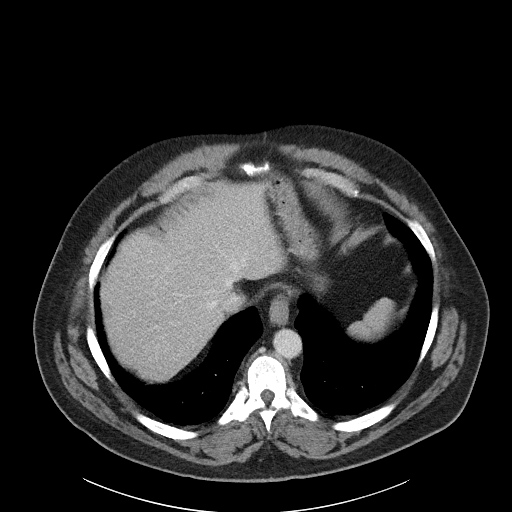
[im 85/90  soft-tissue]
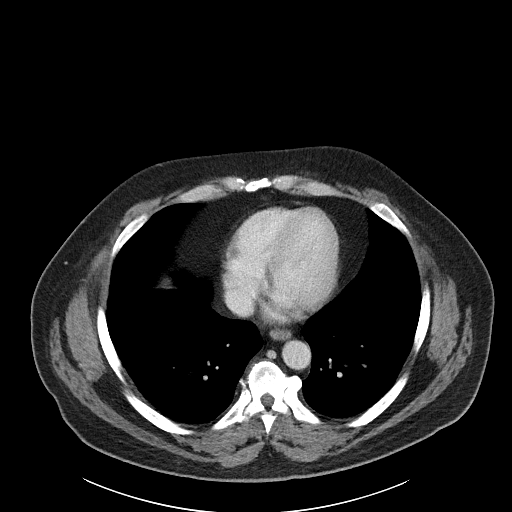

[Series 6: abd/pel w st · coronal · 0.82mm/px · 3 of 114 slices shown]
[im 38/114  soft-tissue]
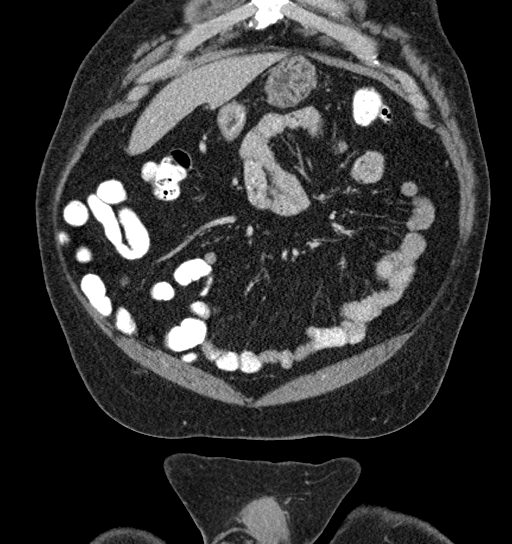
[im 51/114  soft-tissue]
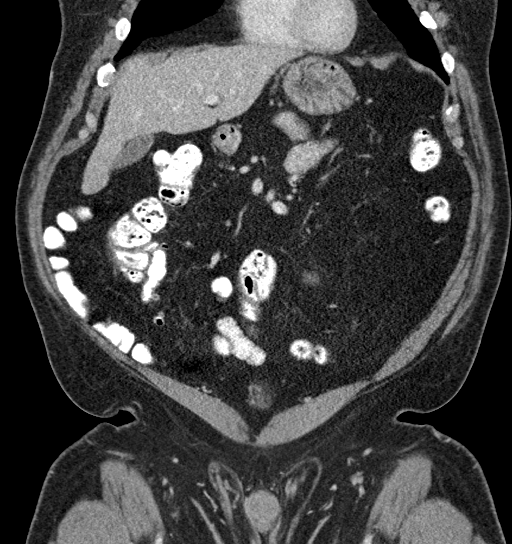
[im 63/114  soft-tissue]
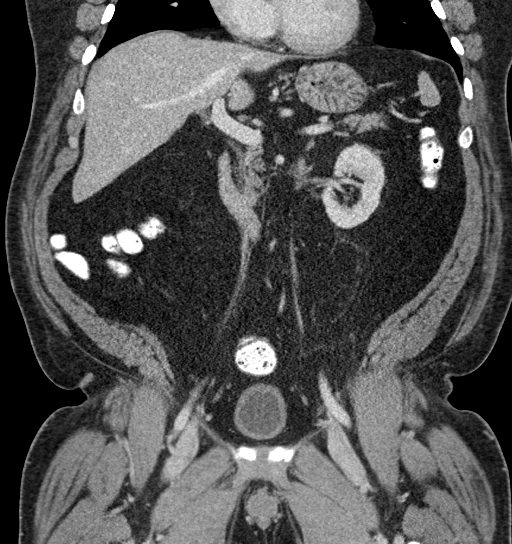

[16 of 46 positions shown; findings below may reference images not displayed]

FINDINGS: Lower chest: Unremarkable.

Hepatobiliary: No cystic or solid hepatic lesions. No intra or
extrahepatic biliary ductal dilatation. Gallbladder is normal in
appearance.

Pancreas: No pancreatic mass. No pancreatic ductal dilatation. No
pancreatic or peripancreatic fluid or inflammatory changes.

Spleen: Unremarkable.

Adrenals/Urinary Tract: Multiple well-defined low-attenuation
lesions in the right kidney are compatible with simple cysts
measuring up to 2.4 cm in the lower pole. Left kidney and bilateral
adrenal glands are normal in appearance. No hydroureteronephrosis.
Previously noted periureteric stranding seen on prior study from
05/19/2016 has nearly completely resolved. Urinary bladder is normal
in appearance.

Stomach/Bowel: The appearance of the stomach is normal. There is no
pathologic dilatation of small bowel or colon. Normal appendix.

Vascular/Lymphatic: No significant atherosclerotic disease, aneurysm
or dissection identified in the abdominal or pelvic vasculature. No
lymphadenopathy noted in the abdomen or pelvis.

Reproductive: Prostate gland and seminal vesicles are unremarkable
in appearance.

Other: Small umbilical hernia containing only omental fat. No
significant volume of ascites. No pneumoperitoneum.

Musculoskeletal: There are no aggressive appearing lytic or blastic
lesions noted in the visualized portions of the skeleton.
IMPRESSION: 1. No acute findings in the abdomen or pelvis.
2. Previously noted left-sided hydroureteronephrosis has resolved.
Previously noted periureteric stranding has nearly completely
resolved. Findings on the prior study were presumably related to
either upper urinary tract infection or recent passage of a
calculus.
3. Incidental findings, as above.

## 2017-12-13 DIAGNOSIS — Z7984 Long term (current) use of oral hypoglycemic drugs: Secondary | ICD-10-CM | POA: Diagnosis not present

## 2017-12-13 DIAGNOSIS — H2513 Age-related nuclear cataract, bilateral: Secondary | ICD-10-CM | POA: Diagnosis not present

## 2017-12-13 DIAGNOSIS — E119 Type 2 diabetes mellitus without complications: Secondary | ICD-10-CM | POA: Diagnosis not present

## 2018-02-16 ENCOUNTER — Ambulatory Visit (INDEPENDENT_AMBULATORY_CARE_PROVIDER_SITE_OTHER): Payer: Medicare Other | Admitting: Family Medicine

## 2018-02-16 ENCOUNTER — Encounter: Payer: Self-pay | Admitting: Family Medicine

## 2018-02-16 VITALS — BP 130/83 | HR 100 | Temp 98.9°F | Ht 64.0 in | Wt 235.0 lb

## 2018-02-16 DIAGNOSIS — N183 Chronic kidney disease, stage 3 unspecified: Secondary | ICD-10-CM | POA: Insufficient documentation

## 2018-02-16 DIAGNOSIS — I10 Essential (primary) hypertension: Secondary | ICD-10-CM

## 2018-02-16 DIAGNOSIS — E1122 Type 2 diabetes mellitus with diabetic chronic kidney disease: Secondary | ICD-10-CM | POA: Diagnosis not present

## 2018-02-16 DIAGNOSIS — E1169 Type 2 diabetes mellitus with other specified complication: Secondary | ICD-10-CM

## 2018-02-16 DIAGNOSIS — E1159 Type 2 diabetes mellitus with other circulatory complications: Secondary | ICD-10-CM

## 2018-02-16 DIAGNOSIS — E785 Hyperlipidemia, unspecified: Secondary | ICD-10-CM

## 2018-02-16 DIAGNOSIS — I152 Hypertension secondary to endocrine disorders: Secondary | ICD-10-CM

## 2018-02-16 LAB — CMP14+EGFR
ALK PHOS: 82 IU/L (ref 39–117)
ALT: 32 IU/L (ref 0–44)
AST: 32 IU/L (ref 0–40)
Albumin/Globulin Ratio: 1.5 (ref 1.2–2.2)
Albumin: 4.3 g/dL (ref 3.8–4.8)
BUN/Creatinine Ratio: 6 — ABNORMAL LOW (ref 10–24)
BUN: 11 mg/dL (ref 8–27)
Bilirubin Total: 0.7 mg/dL (ref 0.0–1.2)
CHLORIDE: 101 mmol/L (ref 96–106)
CO2: 23 mmol/L (ref 20–29)
CREATININE: 1.75 mg/dL — AB (ref 0.76–1.27)
Calcium: 10.1 mg/dL (ref 8.6–10.2)
GFR calc Af Amer: 46 mL/min/{1.73_m2} — ABNORMAL LOW (ref >59)
GFR calc non Af Amer: 40 mL/min/{1.73_m2} — ABNORMAL LOW (ref >59)
GLUCOSE: 133 mg/dL — AB (ref 65–99)
Globulin, Total: 2.8 g/dL (ref 1.5–4.5)
POTASSIUM: 4.2 mmol/L (ref 3.5–5.2)
Sodium: 138 mmol/L (ref 134–144)
Total Protein: 7.1 g/dL (ref 6.0–8.5)

## 2018-02-16 LAB — LIPID PANEL
CHOL/HDL RATIO: 1.9 ratio (ref 0.0–5.0)
Cholesterol, Total: 100 mg/dL (ref 100–199)
HDL: 52 mg/dL (ref >39)
LDL Calculated: 34 mg/dL (ref 0–99)
TRIGLYCERIDES: 70 mg/dL (ref 0–149)
VLDL CHOLESTEROL CAL: 14 mg/dL (ref 5–40)

## 2018-02-16 LAB — BAYER DCA HB A1C WAIVED: HB A1C (BAYER DCA - WAIVED): 7 % — ABNORMAL HIGH (ref <7.0)

## 2018-02-16 MED ORDER — SITAGLIPTIN PHOSPHATE 100 MG PO TABS: 100.0000 mg | ORAL_TABLET | Freq: Every day | ORAL | 3 refills | Status: DC

## 2018-02-16 NOTE — Progress Notes (Signed)
BP 130/83   Pulse 100   Temp 98.9 F (37.2 C) (Oral)   Ht '5\' 4"'$  (1.626 m)   Wt 235 lb (106.6 kg)   BMI 40.34 kg/m    Subjective:    Patient ID: Paul Williams, male    DOB: [birth year], 68 y.o.   MRN: [id number]  HPI: Paul Williams is a 68 y.o. male presenting on 02/16/2018 for Diabetes (3 month follow up); Hyperlipidemia; and Hypertension   HPI Type 2 diabetes mellitus Patient comes in today for recheck of his diabetes. Patient has been currently taking Januvia. Patient is currently on an ACE inhibitor/ARB. Patient has not seen an ophthalmologist this year. Patient denies any issues with their feet.  Patient has morbid obesity we discussed weight and also discussed CKD for which she has nephrology and we are managing, he is stage III CKD and has been stable.  Hypertension Patient is currently on lisinopril-hydrochlorothiazide, and their blood pressure today is 130/83. Patient denies any lightheadedness or dizziness. Patient denies headaches, blurred vision, chest pains, shortness of breath, or weakness. Denies any side effects from medication and is content with current medication.   Hyperlipidemia Patient is coming in for recheck of his hyperlipidemia. The patient is currently taking Lipitor. They deny any issues with myalgias or history of liver damage from it. They deny any focal numbness or weakness or chest pain.   Relevant past medical, surgical, family and social history reviewed and updated as indicated. Interim medical history since our last visit reviewed. Allergies and medications reviewed and updated.  Review of Systems  Constitutional: Negative for chills and fever.  Eyes: Negative for visual disturbance.  Respiratory: Negative for shortness of breath and wheezing.   Cardiovascular: Negative for chest pain and leg swelling.  Musculoskeletal: Negative for back pain and gait problem.  Skin: Negative for rash.  Neurological: Negative for dizziness, weakness and  light-headedness.  All other systems reviewed and are negative.   Per HPI unless specifically indicated above   Allergies as of 02/16/2018   No Known Allergies     Medication List       Accurate as of February 16, 2018  9:33 AM. Always use your most recent med list.        aspirin EC 81 MG tablet Take 81 mg by mouth daily.   atorvastatin 40 MG tablet Commonly known as:  LIPITOR Take 1 tablet (40 mg total) by mouth daily at 6 PM.   fish oil-omega-3 fatty acids 1000 MG capsule Take 1 g by mouth daily.   lisinopril-hydrochlorothiazide 20-12.5 MG tablet Commonly known as:  PRINZIDE,ZESTORETIC TAKE ONE (1) TABLET EACH DAY   multivitamin tablet Take 1 tablet by mouth daily.   OVER THE COUNTER MEDICATION Take 250 mg by mouth 2 (two) times daily. Super beta prostate   sildenafil 100 MG tablet Commonly known as:  VIAGRA TAKE ONE TABLET AS NEEDED   sitaGLIPtin 100 MG tablet Commonly known as:  JANUVIA Take 1 tablet (100 mg total) by mouth daily.   Vitamin D3 125 MCG (5000 UT) Caps Take 1 capsule by mouth daily.          Objective:    BP 130/83   Pulse 100   Temp 98.9 F (37.2 C) (Oral)   Ht '5\' 4"'$  (1.626 m)   Wt 235 lb (106.6 kg)   BMI 40.34 kg/m   Wt Readings from Last 3 Encounters:  02/16/18 235 lb (106.6 kg)  11/15/17 230 lb (104.3 kg)  08/12/17  224 lb 3.2 oz (101.7 kg)    Physical Exam Vitals signs and nursing note reviewed.  Constitutional:      General: He is not in acute distress.    Appearance: He is well-developed. He is not diaphoretic.  Eyes:     General: No scleral icterus.    Conjunctiva/sclera: Conjunctivae normal.  Neck:     Musculoskeletal: Neck supple.     Thyroid: No thyromegaly.  Cardiovascular:     Rate and Rhythm: Normal rate and regular rhythm.     Heart sounds: Normal heart sounds. No murmur.  Pulmonary:     Effort: Pulmonary effort is normal. No respiratory distress.     Breath sounds: Normal breath sounds. No wheezing.    Musculoskeletal: Normal range of motion.  Lymphadenopathy:     Cervical: No cervical adenopathy.  Skin:    General: Skin is warm and dry.     Findings: No rash.  Neurological:     Mental Status: He is alert and oriented to person, place, and time.     Coordination: Coordination normal.  Psychiatric:        Behavior: Behavior normal.         Assessment & Plan:   Problem List Items Addressed This Visit      Cardiovascular and Mediastinum   Hypertension associated with diabetes ([place])   Relevant Medications   sitaGLIPtin (JANUVIA) 100 MG tablet     Endocrine   Hyperlipidemia associated with type 2 diabetes mellitus ([facility])   Relevant Medications   sitaGLIPtin (JANUVIA) 100 MG tablet   Other Relevant Orders   Lipid panel   Type 2 diabetes mellitus with diabetic chronic kidney disease ([place]) - Primary   Relevant Medications   sitaGLIPtin (JANUVIA) 100 MG tablet   Other Relevant Orders   CMP14+EGFR   Bayer DCA Hb A1c Waived     Genitourinary   Chronic kidney disease (CKD), stage III (moderate) ([facility])     Other   Morbid obesity ([facility])   Relevant Medications   sitaGLIPtin (JANUVIA) 100 MG tablet      Gave patient samples for [place] Follow up plan: Return in about 3 months (around 05/17/2018), or if symptoms worsen or fail to improve, for Diabetes and hypertension and cholesterol.  Counseling provided for all of the vaccine components Orders Placed This Encounter  Procedures  . CMP14+EGFR  . Lipid panel  . [facility] Hb A1c Flora, MD [facility] 02/16/2018, 9:33 AM

## 2018-02-28 ENCOUNTER — Telehealth: Payer: Self-pay

## 2018-02-28 NOTE — Telephone Encounter (Signed)
Patient states that he has to pick up Januvia 100mg  at pharmacy patient states he was taking 50 and wants to make sure he is supposed to increase to 100? Per last office note says patient is on 100 but does not say it is increased. Please advise - patient aware you will return in the office 2/26.

## 2018-03-01 NOTE — Telephone Encounter (Signed)
None that is probably an error, does not look like there is reason to increase it, keep him at the [place] [facility] and send him a new prescription for a years worth.

## 2018-03-01 NOTE — Telephone Encounter (Signed)
Patient states he actually called to let you know to keep prescribing the Januvia 100 mg and he will split them in half.  He said they are so expensive this will work better for him because his prescription will actually last for 6 months instead of 3.

## 2018-03-01 NOTE — Telephone Encounter (Signed)
Okay that is fine, we will keep it as the Januvia 100.

## 2018-03-06 DIAGNOSIS — Z79899 Other long term (current) drug therapy: Secondary | ICD-10-CM | POA: Diagnosis not present

## 2018-03-06 DIAGNOSIS — E559 Vitamin D deficiency, unspecified: Secondary | ICD-10-CM | POA: Diagnosis not present

## 2018-03-06 DIAGNOSIS — N183 Chronic kidney disease, stage 3 (moderate): Secondary | ICD-10-CM | POA: Diagnosis not present

## 2018-03-06 DIAGNOSIS — R809 Proteinuria, unspecified: Secondary | ICD-10-CM | POA: Diagnosis not present

## 2018-03-06 DIAGNOSIS — I10 Essential (primary) hypertension: Secondary | ICD-10-CM | POA: Diagnosis not present

## 2018-03-06 DIAGNOSIS — D509 Iron deficiency anemia, unspecified: Secondary | ICD-10-CM | POA: Diagnosis not present

## 2018-03-15 DIAGNOSIS — N183 Chronic kidney disease, stage 3 (moderate): Secondary | ICD-10-CM | POA: Diagnosis not present

## 2018-03-15 DIAGNOSIS — G4730 Sleep apnea, unspecified: Secondary | ICD-10-CM | POA: Diagnosis not present

## 2018-05-17 ENCOUNTER — Ambulatory Visit (INDEPENDENT_AMBULATORY_CARE_PROVIDER_SITE_OTHER): Payer: Medicare Other | Admitting: Family Medicine

## 2018-05-17 ENCOUNTER — Encounter: Payer: Self-pay | Admitting: Family Medicine

## 2018-05-17 ENCOUNTER — Other Ambulatory Visit: Payer: Self-pay

## 2018-05-17 DIAGNOSIS — I10 Essential (primary) hypertension: Secondary | ICD-10-CM | POA: Diagnosis not present

## 2018-05-17 DIAGNOSIS — E785 Hyperlipidemia, unspecified: Secondary | ICD-10-CM | POA: Diagnosis not present

## 2018-05-17 DIAGNOSIS — E1122 Type 2 diabetes mellitus with diabetic chronic kidney disease: Secondary | ICD-10-CM

## 2018-05-17 DIAGNOSIS — E1169 Type 2 diabetes mellitus with other specified complication: Secondary | ICD-10-CM | POA: Diagnosis not present

## 2018-05-17 DIAGNOSIS — N183 Chronic kidney disease, stage 3 unspecified: Secondary | ICD-10-CM

## 2018-05-17 DIAGNOSIS — E1159 Type 2 diabetes mellitus with other circulatory complications: Secondary | ICD-10-CM

## 2018-05-17 NOTE — Progress Notes (Signed)
Virtual Visit via telephone Note  I connected with Maven Nick on 05/17/18 at 0936 by telephone and verified that I am speaking with the correct person using two identifiers. Paul Williams is currently located at home and no other people are currently with her during visit. The provider, Fransisca Kaufmann Alexya Mcdaris, MD is located in their office at time of visit.  Call ended at [phone]  I discussed the limitations, risks, security and privacy concerns of performing an evaluation and management service by telephone and the availability of in person appointments. I also discussed with the patient that there may be a patient responsible charge related to this service. The patient expressed understanding and agreed to proceed.   History and Present Illness: Type 2 diabetes mellitus Patient comes in today for recheck of his diabetes. Patient has been currently taking januvia, bs 119-140. Patient is currently on an ACE inhibitor/ARB. Patient has not seen an ophthalmologist this year. Patient denies any issues with their feet. Patient has ckd stage 3  Hypertension Patient is currently on lisinopril-hctz, and their blood pressure today is unknown. Patient denies any lightheadedness or dizziness. Patient denies headaches, blurred vision, chest pains, shortness of breath, or weakness. Denies any side effects from medication and is content with current medication.   Hyperlipidemia Patient is coming in for recheck of his hyperlipidemia. The patient is currently taking lipitor. They deny any issues with myalgias or history of liver damage from it. They deny any focal numbness or weakness or chest pain.   No diagnosis found.  Outpatient Encounter Medications as of 05/17/2018  Medication Sig  . aspirin EC 81 MG tablet Take 81 mg by mouth daily.  Marland Kitchen atorvastatin (LIPITOR) 40 MG tablet Take 1 tablet (40 mg total) by mouth daily at 6 PM.  . Cholecalciferol (VITAMIN D3) 5000 UNITS CAPS Take 1 capsule by mouth daily.    .  fish oil-omega-3 fatty acids 1000 MG capsule Take 1 g by mouth daily.    Marland Kitchen lisinopril-hydrochlorothiazide (PRINZIDE,ZESTORETIC) 20-12.5 MG tablet TAKE ONE (1) TABLET EACH DAY  . Multiple Vitamin (MULTIVITAMIN) tablet Take 1 tablet by mouth daily.  Marland Kitchen OVER THE COUNTER MEDICATION Take 250 mg by mouth 2 (two) times daily. Super beta prostate  . sildenafil (VIAGRA) 100 MG tablet TAKE ONE TABLET AS NEEDED  . sitaGLIPtin (JANUVIA) 100 MG tablet Take 1 tablet (100 mg total) by mouth daily.   Facility-Administered Encounter Medications as of 05/17/2018  Medication  . 0.9 %  sodium chloride infusion    Review of Systems  Constitutional: Negative for chills and fever.  Eyes: Negative for visual disturbance.  Respiratory: Negative for shortness of breath and wheezing.   Cardiovascular: Negative for chest pain and leg swelling.  Musculoskeletal: Negative for back pain and gait problem.  Skin: Negative for rash.  Neurological: Negative for dizziness, weakness and light-headedness.  All other systems reviewed and are negative.   Observations/Objective: Patient sounds comfortable and in no acute distress  Assessment and Plan: Problem List Items Addressed This Visit      Cardiovascular and Mediastinum   Hypertension associated with diabetes ([place])     Endocrine   Hyperlipidemia associated with type 2 diabetes mellitus ([place])   Type 2 diabetes mellitus with diabetic chronic kidney disease ([place]) - Primary     Genitourinary   Chronic kidney disease (CKD), stage III (moderate) ([place])       Follow Up Instructions: Follow up in 3 months  Continue current medications, doing well   I  discussed the assessment and treatment plan with the patient. The patient was provided an opportunity to ask questions and all were answered. The patient agreed with the plan and demonstrated an understanding of the instructions.   The patient was advised to call back or seek an in-person evaluation if the symptoms  worsen or if the condition fails to improve as anticipated.  The above assessment and management plan was discussed with the patient. The patient verbalized understanding of and has agreed to the management plan. Patient is aware to call the clinic if symptoms persist or worsen. Patient is aware when to return to the clinic for a follow-up visit. Patient educated on when it is appropriate to go to the emergency department.    I provided 13 minutes of non-face-to-face time during this encounter.    Worthy Rancher, MD

## 2018-05-31 ENCOUNTER — Other Ambulatory Visit: Payer: Self-pay

## 2018-06-05 ENCOUNTER — Encounter: Payer: Medicare Other | Admitting: Family Medicine

## 2018-06-22 ENCOUNTER — Ambulatory Visit (INDEPENDENT_AMBULATORY_CARE_PROVIDER_SITE_OTHER): Payer: Medicare Other | Admitting: Family Medicine

## 2018-06-22 ENCOUNTER — Other Ambulatory Visit: Payer: Self-pay

## 2018-06-22 ENCOUNTER — Encounter: Payer: Self-pay | Admitting: Family Medicine

## 2018-06-22 VITALS — BP 135/78 | HR 90 | Temp 98.8°F | Ht 64.0 in | Wt 231.4 lb

## 2018-06-22 DIAGNOSIS — Z0000 Encounter for general adult medical examination without abnormal findings: Secondary | ICD-10-CM

## 2018-06-22 NOTE — Progress Notes (Signed)
Subjective:   Paul Williams is a 68 y.o. male who presents for a Welcome to Medicare exam.   Patient has been doing very well with his health and is very happy with his care here and how things are going and he still sees the nephrologist.  He has been trying to keep home with the coronavirus as much as he can.  He is retired.  Review of Systems: Review of Systems - General ROS: negative for - chills or fever Respiratory ROS: no cough, shortness of breath, or wheezing Cardiovascular ROS: no chest pain or dyspnea on exertion Cardiac Risk Factors include: advanced age (>24men, >65 women);diabetes mellitus;male gender;hypertension The ASCVD Risk score Mikey Bussing DC Jr., et al., 2013) failed to calculate for the following reasons:   The valid total cholesterol range is 130 to 320 mg/dL     Objective:    Today's Vitals   06/22/18 1011  BP: 135/78  Pulse: 90  Temp: 98.8 F (37.1 C)  TempSrc: Oral  Weight: 231 lb 6.4 oz (105 kg)  Height: 5\' 4"  (1.626 m)   Body mass index is 39.72 kg/m.  Medications Outpatient Encounter Medications as of 06/22/2018  Medication Sig  . aspirin EC 81 MG tablet Take 81 mg by mouth daily.  Marland Kitchen atorvastatin (LIPITOR) 40 MG tablet Take 1 tablet (40 mg total) by mouth daily at 6 PM.  . Cholecalciferol (VITAMIN D3) 5000 UNITS CAPS Take 1 capsule by mouth daily.    . fish oil-omega-3 fatty acids 1000 MG capsule Take 1 g by mouth daily.    Marland Kitchen lisinopril-hydrochlorothiazide (PRINZIDE,ZESTORETIC) 20-12.5 MG tablet TAKE ONE (1) TABLET EACH DAY  . Multiple Vitamin (MULTIVITAMIN) tablet Take 1 tablet by mouth daily.  Marland Kitchen OVER THE COUNTER MEDICATION Take 250 mg by mouth 2 (two) times daily. Super beta prostate  . sildenafil (VIAGRA) 100 MG tablet TAKE ONE TABLET AS NEEDED  . sitaGLIPtin (JANUVIA) 100 MG tablet Take 1 tablet (100 mg total) by mouth daily.   Facility-Administered Encounter Medications as of 06/22/2018  Medication  . 0.9 %  sodium chloride infusion      History: Past Medical History:  Diagnosis Date  . Adenomatous polyp of colon   . Diabetes mellitus without complication ([place])   . Diverticulosis   . Hyperlipidemia   . Hypertension   . Internal hemorrhoids    Past Surgical History:  Procedure Laterality Date  . COLONOSCOPY    . top plate of teeth extracted      Family History  Problem Relation Age of Onset  . Stroke Mother   . Diabetes Father   . Prostate cancer Father        prostate  . Diabetes Sister   . Diabetes Sister   . Colon cancer Neg Hx   . Esophageal cancer Neg Hx   . Rectal cancer Neg Hx   . Stomach cancer Neg Hx    Social History   Occupational History  . Not on file  Tobacco Use  . Smoking status: Never Smoker  . Smokeless tobacco: Never Used  Substance and Sexual Activity  . Alcohol use: Yes    Comment: occ  . Drug use: No  . Sexual activity: Yes    Partners: Female   Tobacco Counseling Counseling given: Not Answered   Immunizations and Health Maintenance Immunization History  Administered Date(s) Administered  . Pneumococcal Conjugate-13 08/05/2016  . Tdap 09/02/2016   Health Maintenance Due  Topic Date Due  . OPHTHALMOLOGY EXAM  09/03/2015    Activities of Daily Living In your present state of health, do you have any difficulty performing the following activities: 06/22/2018  Hearing? N  Vision? N  Difficulty concentrating or making decisions? N  Walking or climbing stairs? N  Dressing or bathing? N  Doing errands, shopping? N  Preparing Food and eating ? N  Using the Toilet? N  In the past six months, have you accidently leaked urine? N  Do you have problems with loss of bowel control? N  Managing your Medications? N  Managing your Finances? N  Housekeeping or managing your Housekeeping? N  Some recent data might be hidden    Physical Exam  (optional), or other factors deemed appropriate based on the beneficiary's medical and social history and current clinical standards.   Advanced Directives: Does Patient Have a Medical Advance Directive?: No    Assessment:    This is a routine wellness  examination for this patient .   Vision/Hearing screen No exam data present  Dietary issues and exercise activities discussed:  Current Exercise Habits: Home exercise routine, Type of exercise: walking, Time (Minutes): 30, Frequency (Times/Week): 3, Weekly Exercise (Minutes/Week): 90, Intensity: Moderate  Goals   None     Depression Screen PHQ 2/9 Scores 06/22/2018 02/16/2018 11/15/2017 08/12/2017  PHQ - 2 Score 0 0 0 0  PHQ- 9 Score - - - -     Fall Risk Fall Risk  11/15/2017  Falls in the past year? 0    Cognitive Function MMSE - Mini Mental State Exam 06/22/2018  Orientation to time 5  Orientation to Place 5  Registration 3  Attention/ Calculation 5  Recall 3  Language- name 2 objects 2  Language- repeat 1  Language- follow 3 step command 1  Language- read & follow direction 1  Write a sentence 1  Copy design 1  Total score 28        Patient Care Team: Richel Millspaugh, Fransisca Kaufmann, MD as PCP - General (Family Medicine)     Plan:     Problem List Items Addressed This Visit    None    Visit Diagnoses    Encounter for Medicare annual wellness exam    -  Primary       I have personally reviewed and noted the following in the patient's chart:   . Medical and social history . Use of alcohol, tobacco or illicit drugs  . Current medications and supplements . Functional ability and status . Nutritional status . Physical activity . Advanced directives . List of other physicians . Hospitalizations, surgeries, and ER visits in previous 12 months . Vitals . Screenings to include cognitive, depression, and falls . Referrals and appointments  In addition, I have reviewed and discussed with patient certain preventive protocols, quality metrics, and best practice recommendations. A written personalized care plan for preventive services as well as  general preventive health recommendations were provided to patient.    Worthy Rancher, MD 06/22/2018

## 2018-07-04 ENCOUNTER — Other Ambulatory Visit: Payer: Self-pay | Admitting: Family Medicine

## 2018-07-04 DIAGNOSIS — E1159 Type 2 diabetes mellitus with other circulatory complications: Secondary | ICD-10-CM

## 2018-07-25 DIAGNOSIS — N183 Chronic kidney disease, stage 3 (moderate): Secondary | ICD-10-CM | POA: Diagnosis not present

## 2018-07-25 DIAGNOSIS — E559 Vitamin D deficiency, unspecified: Secondary | ICD-10-CM | POA: Diagnosis not present

## 2018-07-25 DIAGNOSIS — R809 Proteinuria, unspecified: Secondary | ICD-10-CM | POA: Diagnosis not present

## 2018-07-25 DIAGNOSIS — Z79899 Other long term (current) drug therapy: Secondary | ICD-10-CM | POA: Diagnosis not present

## 2018-07-25 DIAGNOSIS — I10 Essential (primary) hypertension: Secondary | ICD-10-CM | POA: Diagnosis not present

## 2018-07-25 DIAGNOSIS — D649 Anemia, unspecified: Secondary | ICD-10-CM | POA: Diagnosis not present

## 2018-08-16 ENCOUNTER — Other Ambulatory Visit: Payer: Self-pay | Admitting: *Deleted

## 2018-08-16 ENCOUNTER — Other Ambulatory Visit: Payer: Self-pay

## 2018-08-16 ENCOUNTER — Other Ambulatory Visit: Payer: Medicare Other

## 2018-08-16 ENCOUNTER — Telehealth: Payer: Self-pay | Admitting: Family Medicine

## 2018-08-16 DIAGNOSIS — E1159 Type 2 diabetes mellitus with other circulatory complications: Secondary | ICD-10-CM

## 2018-08-16 DIAGNOSIS — E1122 Type 2 diabetes mellitus with diabetic chronic kidney disease: Secondary | ICD-10-CM

## 2018-08-16 DIAGNOSIS — I152 Hypertension secondary to endocrine disorders: Secondary | ICD-10-CM

## 2018-08-16 DIAGNOSIS — E1169 Type 2 diabetes mellitus with other specified complication: Secondary | ICD-10-CM

## 2018-08-16 DIAGNOSIS — E785 Hyperlipidemia, unspecified: Secondary | ICD-10-CM

## 2018-08-16 DIAGNOSIS — N183 Chronic kidney disease, stage 3 (moderate): Secondary | ICD-10-CM | POA: Diagnosis not present

## 2018-08-16 DIAGNOSIS — Z125 Encounter for screening for malignant neoplasm of prostate: Secondary | ICD-10-CM

## 2018-08-16 DIAGNOSIS — I10 Essential (primary) hypertension: Secondary | ICD-10-CM | POA: Diagnosis not present

## 2018-08-16 DIAGNOSIS — R809 Proteinuria, unspecified: Secondary | ICD-10-CM

## 2018-08-16 LAB — CBC WITH DIFFERENTIAL/PLATELET
Basophils Absolute: 0 x10E3/uL (ref 0.0–0.2)
Basos: 0 %
EOS (ABSOLUTE): 0.1 x10E3/uL (ref 0.0–0.4)
Eos: 2 %
Hematocrit: 42.6 % (ref 37.5–51.0)
Hemoglobin: 14.7 g/dL (ref 13.0–17.7)
Immature Grans (Abs): 0 x10E3/uL (ref 0.0–0.1)
Immature Granulocytes: 0 %
Lymphocytes Absolute: 2.6 x10E3/uL (ref 0.7–3.1)
Lymphs: 32 %
MCH: 29.8 pg (ref 26.6–33.0)
MCHC: 34.5 g/dL (ref 31.5–35.7)
MCV: 86 fL (ref 79–97)
Monocytes Absolute: 0.7 x10E3/uL (ref 0.1–0.9)
Monocytes: 8 %
Neutrophils Absolute: 4.8 x10E3/uL (ref 1.4–7.0)
Neutrophils: 58 %
Platelets: 277 x10E3/uL (ref 150–450)
RBC: 4.93 x10E6/uL (ref 4.14–5.80)
RDW: 13 % (ref 11.6–15.4)
WBC: 8.3 x10E3/uL (ref 3.4–10.8)

## 2018-08-16 LAB — CMP14+EGFR
ALT: 20 IU/L (ref 0–44)
AST: 27 IU/L (ref 0–40)
Albumin/Globulin Ratio: 1.6 (ref 1.2–2.2)
Albumin: 4.1 g/dL (ref 3.8–4.8)
Alkaline Phosphatase: 72 IU/L (ref 39–117)
BUN/Creatinine Ratio: 8 — ABNORMAL LOW (ref 10–24)
BUN: 13 mg/dL (ref 8–27)
Bilirubin Total: 0.7 mg/dL (ref 0.0–1.2)
CO2: 24 mmol/L (ref 20–29)
Calcium: 9.8 mg/dL (ref 8.6–10.2)
Chloride: 102 mmol/L (ref 96–106)
Creatinine, Ser: 1.6 mg/dL — ABNORMAL HIGH (ref 0.76–1.27)
GFR calc Af Amer: 51 mL/min/{1.73_m2} — ABNORMAL LOW (ref >59)
GFR calc non Af Amer: 44 mL/min/{1.73_m2} — ABNORMAL LOW (ref >59)
Globulin, Total: 2.6 g/dL (ref 1.5–4.5)
Glucose: 137 mg/dL — ABNORMAL HIGH (ref 65–99)
Potassium: 4.5 mmol/L (ref 3.5–5.2)
Sodium: 140 mmol/L (ref 134–144)
Total Protein: 6.7 g/dL (ref 6.0–8.5)

## 2018-08-16 LAB — LIPID PANEL
Chol/HDL Ratio: 2 ratio (ref 0.0–5.0)
Cholesterol, Total: 98 mg/dL — ABNORMAL LOW (ref 100–199)
HDL: 48 mg/dL (ref >39)
LDL Calculated: 39 mg/dL (ref 0–99)
Triglycerides: 54 mg/dL (ref 0–149)
VLDL Cholesterol Cal: 11 mg/dL (ref 5–40)

## 2018-08-16 LAB — BAYER DCA HB A1C WAIVED: HB A1C (BAYER DCA - WAIVED): 6.7 % (ref <7.0)

## 2018-08-16 NOTE — Telephone Encounter (Signed)
Aware, per message, labs ordered.

## 2018-08-16 NOTE — Addendum Note (Signed)
Addended by: Nigel Berthold C on: 08/16/2018 10:46 AM   Modules accepted: Orders

## 2018-08-17 LAB — MICROALBUMIN / CREATININE URINE RATIO
Creatinine, Urine: 400.4 mg/dL
Microalb/Creat Ratio: 2 mg/g creat (ref 0–29)
Microalbumin, Urine: 7.4 ug/mL

## 2018-08-18 ENCOUNTER — Telehealth: Payer: Self-pay | Admitting: Family Medicine

## 2018-08-18 NOTE — Telephone Encounter (Signed)
Aware of results. 

## 2018-08-18 NOTE — Telephone Encounter (Signed)
Aware of results and recommendations  

## 2018-08-23 ENCOUNTER — Ambulatory Visit (INDEPENDENT_AMBULATORY_CARE_PROVIDER_SITE_OTHER): Payer: Medicare Other | Admitting: Family Medicine

## 2018-08-23 ENCOUNTER — Encounter: Payer: Self-pay | Admitting: Family Medicine

## 2018-08-23 DIAGNOSIS — E1122 Type 2 diabetes mellitus with diabetic chronic kidney disease: Secondary | ICD-10-CM

## 2018-08-23 DIAGNOSIS — N529 Male erectile dysfunction, unspecified: Secondary | ICD-10-CM

## 2018-08-23 DIAGNOSIS — N183 Chronic kidney disease, stage 3 unspecified: Secondary | ICD-10-CM

## 2018-08-23 DIAGNOSIS — I152 Hypertension secondary to endocrine disorders: Secondary | ICD-10-CM

## 2018-08-23 DIAGNOSIS — I10 Essential (primary) hypertension: Secondary | ICD-10-CM | POA: Diagnosis not present

## 2018-08-23 DIAGNOSIS — E1159 Type 2 diabetes mellitus with other circulatory complications: Secondary | ICD-10-CM

## 2018-08-23 DIAGNOSIS — E785 Hyperlipidemia, unspecified: Secondary | ICD-10-CM | POA: Diagnosis not present

## 2018-08-23 DIAGNOSIS — E1169 Type 2 diabetes mellitus with other specified complication: Secondary | ICD-10-CM | POA: Diagnosis not present

## 2018-08-23 MED ORDER — SITAGLIPTIN PHOSPHATE 100 MG PO TABS: 100.0000 mg | ORAL_TABLET | Freq: Every day | ORAL | 3 refills | Status: DC

## 2018-08-23 MED ORDER — ATORVASTATIN CALCIUM 40 MG PO TABS: 40.0000 mg | ORAL_TABLET | Freq: Every day | ORAL | 3 refills | Status: DC

## 2018-08-23 MED ORDER — LISINOPRIL-HYDROCHLOROTHIAZIDE 20-12.5 MG PO TABS: 1.0000 | ORAL_TABLET | Freq: Every day | ORAL | 3 refills | Status: DC

## 2018-08-23 MED ORDER — SILDENAFIL CITRATE 100 MG PO TABS: 50.0000 mg | ORAL_TABLET | ORAL | 0 refills | Status: DC | PRN

## 2018-08-23 NOTE — Progress Notes (Signed)
Virtual Visit via telephone Note  I connected with Paul Williams on 08/23/18 at [place] by telephone and verified that I am speaking with the correct person using two identifiers. Paul Williams is currently located at home and no other people are currently with her during visit. The provider, Fransisca Kaufmann Dettinger, MD is located in their office at time of visit.  Call ended at [phone]  I discussed the limitations, risks, security and privacy concerns of performing an evaluation and management service by telephone and the availability of in person appointments. I also discussed with the patient that there may be a patient responsible charge related to this service. The patient expressed understanding and agreed to proceed.   History and Present Illness: Type 2 diabetes mellitus Patient comes in today for recheck of his diabetes. Patient has been currently taking [place]. Patient is currently on an ACE inhibitor/ARB. Patient has not seen an ophthalmologist this year. Patient denies any issues with their feet. Patient has ckd3  Hypertension Patient is currently on lisinopril and hctz, and their blood pressure today is unknown. Patient denies any lightheadedness or dizziness. Patient denies headaches, blurred vision, chest pains, shortness of breath, or weakness. Denies any side effects from medication and is content with current medication.   Hyperlipidemia Patient is coming in for recheck of his hyperlipidemia. The patient is currently taking lipitor. They deny any issues with myalgias or history of liver damage from it. They deny any focal numbness or weakness or chest pain.   No diagnosis found.  Outpatient Encounter Medications as of 08/23/2018  Medication Sig  . aspirin EC 81 MG tablet Take 81 mg by mouth daily.  Marland Kitchen atorvastatin (LIPITOR) 40 MG tablet Take 1 tablet (40 mg total) by mouth daily at 6 PM.  . Cholecalciferol (VITAMIN D3) 5000 UNITS CAPS Take 1 capsule by mouth daily.    . fish  oil-omega-3 fatty acids 1000 MG capsule Take 1 g by mouth daily.    Marland Kitchen lisinopril-hydrochlorothiazide (ZESTORETIC) 20-12.5 MG tablet TAKE ONE (1) TABLET EACH DAY  . Multiple Vitamin (MULTIVITAMIN) tablet Take 1 tablet by mouth daily.  Marland Kitchen OVER THE COUNTER MEDICATION Take 250 mg by mouth 2 (two) times daily. Super beta prostate  . sildenafil (VIAGRA) 100 MG tablet TAKE ONE TABLET AS NEEDED  . sitaGLIPtin (JANUVIA) 100 MG tablet Take 1 tablet (100 mg total) by mouth daily.   Facility-Administered Encounter Medications as of 08/23/2018  Medication  . 0.9 %  sodium chloride infusion    Review of Systems  Constitutional: Negative for chills and fever.  Eyes: Negative for visual disturbance.  Respiratory: Negative for shortness of breath and wheezing.   Cardiovascular: Negative for chest pain and leg swelling.  Musculoskeletal: Negative for back pain and gait problem.  Skin: Negative for rash.  Neurological: Negative for dizziness, weakness and headaches.  All other systems reviewed and are negative.   Observations/Objective: Patient sounds comfortable and in no acute distress  Assessment and Plan: Problem List Items Addressed This Visit      Cardiovascular and Mediastinum   Hypertension associated with diabetes ([place])     Endocrine   Hyperlipidemia associated with type 2 diabetes mellitus ([place])   Type 2 diabetes mellitus with diabetic chronic kidney disease ([place]) - Primary     Genitourinary   Chronic kidney disease (CKD), stage III (moderate) ([place])       Follow Up Instructions: Labs looked great, no changes in medication.    Follow-up in 3 months for his  usual  I discussed the assessment and treatment plan with the patient. The patient was provided an opportunity to ask questions and all were answered. The patient agreed with the plan and demonstrated an understanding of the instructions.   The patient was advised to call back or seek an in-person evaluation if the symptoms  worsen or if the condition fails to improve as anticipated.  The above assessment and management plan was discussed with the patient. The patient verbalized understanding of and has agreed to the management plan. Patient is aware to call the clinic if symptoms persist or worsen. Patient is aware when to return to the clinic for a follow-up visit. Patient educated on when it is appropriate to go to the emergency department.    I provided 8 minutes of non-face-to-face time during this encounter.    Worthy Rancher, MD

## 2018-08-23 NOTE — Progress Notes (Signed)
Placed orders before visit

## 2018-11-16 ENCOUNTER — Encounter: Payer: Self-pay | Admitting: Nurse Practitioner

## 2018-11-16 ENCOUNTER — Ambulatory Visit (INDEPENDENT_AMBULATORY_CARE_PROVIDER_SITE_OTHER): Payer: Medicare Other | Admitting: Nurse Practitioner

## 2018-11-16 DIAGNOSIS — M10072 Idiopathic gout, left ankle and foot: Secondary | ICD-10-CM | POA: Diagnosis not present

## 2018-11-16 MED ORDER — COLCHICINE 0.6 MG PO TABS: ORAL_TABLET | ORAL | 0 refills | Status: DC

## 2018-11-16 NOTE — Progress Notes (Signed)
Virtual Visit via telephone Note Due to COVID-19 pandemic this visit was conducted virtually. This visit type was conducted due to national recommendations for restrictions regarding the COVID-19 Pandemic (e.g. social distancing, sheltering in place) in an effort to limit this patient's exposure and mitigate transmission in our community. All issues noted in this document were discussed and addressed.  A physical exam was not performed with this format.  I connected with Paul Williams on 11/16/18 at 3:25 by telephone and verified that I am speaking with the correct person using two identifiers. Paul Williams is currently located at home and nonone is currently with him during visit. The provider, Mary-Margaret Hassell Done, FNP is located in their office at time of visit.  I discussed the limitations, risks, security and privacy concerns of performing an evaluation and management service by telephone and the availability of in person appointments. I also discussed with the patient that there may be a patient responsible charge related to this service. The patient expressed understanding and agreed to proceed.   History and Present Illness:   Chief Complaint: Gout   HPI Woke up Monday morning with foot and ankle sore. He has had gout in the past, and does not watch diet at all. The pain is increasing. Rates pain 5-8/10 and Is swollen. He has been soaking it in epsom salt and is not helping. Has not had a flare up in over a year.    Review of Systems  Constitutional: Negative for diaphoresis and weight loss.  Eyes: Negative for blurred vision, double vision and pain.  Respiratory: Negative for shortness of breath.   Cardiovascular: Negative for chest pain, palpitations, orthopnea and leg swelling.  Gastrointestinal: Negative for abdominal pain.  Musculoskeletal: Positive for joint pain (right ankle).  Skin: Negative for rash.  Neurological: Negative for dizziness, sensory change, loss of  consciousness, weakness and headaches.  Endo/Heme/Allergies: Negative for polydipsia. Does not bruise/bleed easily.  Psychiatric/Behavioral: Negative for memory loss. The patient does not have insomnia.   All other systems reviewed and are negative.    Observations/Objective: Alert and oriented- answers all questions appropriately No distress    Assessment and Plan: [name] in today with chief complaint of Gout   1. Acute idiopathic gout of left ankle ic  rest Meds ordered this encounter  Medications  . colchicine 0.6 MG tablet    Sig: 2 tablets at pain onset, may repeat x1in 1 hour. No more then 3 tablets in 24 hours    Dispense:  30 tablet    Refill:  0    Order Specific Question:   Supervising Provider    Answer:   Caryl Pina A [id number]     Follow Up Instructions: prn    I discussed the assessment and treatment plan with the patient. The patient was provided an opportunity to ask questions and all were answered. The patient agreed with the plan and demonstrated an understanding of the instructions.   The patient was advised to call back or seek an in-person evaluation if the symptoms worsen or if the condition fails to improve as anticipated.  The above assessment and management plan was discussed with the patient. The patient verbalized understanding of and has agreed to the management plan. Patient is aware to call the clinic if symptoms persist or worsen. Patient is aware when to return to the clinic for a follow-up visit. Patient educated on when it is appropriate to go to the emergency department.   Time call ended:  3:35  I provided 10 minutes of non-face-to-face time during this encounter.    Mary-Margaret Hassell Done, FNP

## 2018-11-28 ENCOUNTER — Other Ambulatory Visit: Payer: Self-pay

## 2018-11-29 ENCOUNTER — Encounter: Payer: Self-pay | Admitting: Family Medicine

## 2018-11-29 ENCOUNTER — Ambulatory Visit (INDEPENDENT_AMBULATORY_CARE_PROVIDER_SITE_OTHER): Payer: Medicare Other | Admitting: Family Medicine

## 2018-11-29 VITALS — BP 153/88 | HR 108 | Temp 98.9°F | Ht 64.0 in | Wt 227.0 lb

## 2018-11-29 DIAGNOSIS — N183 Chronic kidney disease, stage 3 unspecified: Secondary | ICD-10-CM

## 2018-11-29 DIAGNOSIS — N1831 Chronic kidney disease, stage 3a: Secondary | ICD-10-CM | POA: Diagnosis not present

## 2018-11-29 DIAGNOSIS — E785 Hyperlipidemia, unspecified: Secondary | ICD-10-CM

## 2018-11-29 DIAGNOSIS — E1159 Type 2 diabetes mellitus with other circulatory complications: Secondary | ICD-10-CM

## 2018-11-29 DIAGNOSIS — M109 Gout, unspecified: Secondary | ICD-10-CM | POA: Diagnosis not present

## 2018-11-29 DIAGNOSIS — I10 Essential (primary) hypertension: Secondary | ICD-10-CM

## 2018-11-29 DIAGNOSIS — I152 Hypertension secondary to endocrine disorders: Secondary | ICD-10-CM

## 2018-11-29 DIAGNOSIS — E1169 Type 2 diabetes mellitus with other specified complication: Secondary | ICD-10-CM | POA: Diagnosis not present

## 2018-11-29 DIAGNOSIS — E1122 Type 2 diabetes mellitus with diabetic chronic kidney disease: Secondary | ICD-10-CM

## 2018-11-29 LAB — BAYER DCA HB A1C WAIVED: HB A1C (BAYER DCA - WAIVED): 6.4 % (ref <7.0)

## 2018-11-29 NOTE — Progress Notes (Signed)
BP (!) 153/88   Pulse (!) 108   Temp 98.9 F (37.2 C) (Temporal)   Ht _0  (1.626 m)   Wt 227 lb (103 kg)   SpO2 99%   BMI 38.96 kg/m    Subjective:   Patient ID: Paul Williams, male    DOB: 07-Jul-1950, 68 y.o.   MRN: [id number]  HPI: Paul Williams is a 68 y.o. male presenting on 11/29/2018 for Diabetes (3 month follow up)   HPI Type 2 diabetes mellitus Patient comes in today for recheck of his diabetes. Patient has been currently taking Januvia 50 mg daily, A1c 6.4 today. Patient is currently on an ACE inhibitor/ARB. Patient has not seen an ophthalmologist this year. Patient denies any issues with their feet.  Patient does have CKD stage III but has been stable, he says Januvia sometimes is hard to afford and gave sample of Janumet which she is going to take 50/500  Hypertension Patient is currently on lisinopril hydrochlorothiazide, and their blood pressure today is 153/88, it runs better at home, and is only slightly up likely because of his nervousness about coming out and Covid and wearing masks. Patient denies any lightheadedness or dizziness. Patient denies headaches, blurred vision, chest pains, shortness of breath, or weakness. Denies any side effects from medication and is content with current medication.   Hyperlipidemia Patient is coming in for recheck of his hyperlipidemia. The patient is currently taking atorvastatin and fish oil. They deny any issues with myalgias or history of liver damage from it. They deny any focal numbness or weakness or chest pain.   Relevant past medical, surgical, family and social history reviewed and updated as indicated. Interim medical history since our last visit reviewed. Allergies and medications reviewed and updated.  Review of Systems  Constitutional: Negative for chills and fever.  Respiratory: Negative for shortness of breath and wheezing.   Cardiovascular: Negative for chest pain and leg swelling.  Musculoskeletal: Negative for  back pain and gait problem.  Skin: Negative for rash.  Neurological: Negative for dizziness, weakness and numbness.  All other systems reviewed and are negative.   Per HPI unless specifically indicated above   Allergies as of 11/29/2018   No Known Allergies     Medication List       Accurate as of November 29, 2018  9:51 AM. If you have any questions, ask your nurse or doctor.        aspirin EC 81 MG tablet Take 81 mg by mouth daily.   atorvastatin 40 MG tablet Commonly known as: LIPITOR Take 1 tablet (40 mg total) by mouth daily at 6 PM.   colchicine 0.6 MG tablet 2 tablets at pain onset, may repeat x1in 1 hour. No more then 3 tablets in 24 hours   fish oil-omega-3 fatty acids 1000 MG capsule Take 1 g by mouth daily.   lisinopril-hydrochlorothiazide 20-12.5 MG tablet Commonly known as: ZESTORETIC Take 1 tablet by mouth daily.   multivitamin tablet Take 1 tablet by mouth daily.   OVER THE COUNTER MEDICATION Take 250 mg by mouth 2 (two) times daily. Super beta prostate   sildenafil 100 MG tablet Commonly known as: VIAGRA Take 0.5 tablets (50 mg total) by mouth as needed for erectile dysfunction.   sitaGLIPtin 100 MG tablet Commonly known as: Januvia Take 1 tablet (100 mg total) by mouth daily.   Vitamin D3 125 MCG (5000 UT) Caps Take 1 capsule by mouth daily.  Objective:   BP (!) 153/88   Pulse (!) 108   Temp 98.9 F (37.2 C) (Temporal)   Ht _0  (1.626 m)   Wt 227 lb (103 kg)   SpO2 99%   BMI 38.96 kg/m   Wt Readings from Last 3 Encounters:  11/29/18 227 lb (103 kg)  06/22/18 231 lb 6.4 oz (105 kg)  02/16/18 235 lb (106.6 kg)    Physical Exam Vitals signs and nursing note reviewed.  Constitutional:      General: He is not in acute distress.    Appearance: He is well-developed. He is not diaphoretic.  Eyes:     General: No scleral icterus.    Conjunctiva/sclera: Conjunctivae normal.  Neck:     Musculoskeletal: Neck supple.      Thyroid: No thyromegaly.  Cardiovascular:     Rate and Rhythm: Normal rate and regular rhythm.     Heart sounds: Normal heart sounds. No murmur.  Pulmonary:     Effort: Pulmonary effort is normal. No respiratory distress.     Breath sounds: Normal breath sounds. No wheezing.  Musculoskeletal: Normal range of motion.  Lymphadenopathy:     Cervical: No cervical adenopathy.  Skin:    General: Skin is warm and dry.     Findings: No rash.  Neurological:     Mental Status: He is alert and oriented to person, place, and time.     Coordination: Coordination normal.  Psychiatric:        Behavior: Behavior normal.       Assessment & Plan:   Problem List Items Addressed This Visit      Cardiovascular and Mediastinum   Hypertension associated with diabetes ([place])   Relevant Orders   Uric acid     Endocrine   Hyperlipidemia associated with type 2 diabetes mellitus ([place])   Relevant Orders   Uric acid   Type 2 diabetes mellitus with diabetic chronic kidney disease ([place]) - Primary   Relevant Orders   hgba1c   BMP8+EGFR   Uric acid     Genitourinary   Chronic kidney disease (CKD), stage III (moderate)   Relevant Orders   Uric acid    Other Visit Diagnoses    Acute gout of left ankle, unspecified cause          Gave samples of Janumet 50/500 for the patient once a day because he was out of his Januvia because of affordability  Permissive hypertension here in office, sounds like he is doing better at home, will continue to monitor  Patient possibly had a gout attack over the phone was treated with colchicine and then improved, will check uric acid Follow up plan: Return in about 3 months (around 03/01/2019), or if symptoms worsen or fail to improve, for Hypertension and diabetes and cholesterol.  Counseling provided for all of the vaccine components Orders Placed This Encounter  Procedures  . hgba1c  . BMP8+EGFR    Caryl Pina, MD [facility]  11/29/2018, 9:51 AM

## 2018-11-30 LAB — BMP8+EGFR
BUN/Creatinine Ratio: 8 — ABNORMAL LOW (ref 10–24)
BUN: 13 mg/dL (ref 8–27)
CO2: 22 mmol/L (ref 20–29)
Calcium: 9.9 mg/dL (ref 8.6–10.2)
Chloride: 101 mmol/L (ref 96–106)
Creatinine, Ser: 1.7 mg/dL — ABNORMAL HIGH (ref 0.76–1.27)
GFR calc Af Amer: 48 mL/min/{1.73_m2} — ABNORMAL LOW (ref >59)
GFR calc non Af Amer: 41 mL/min/{1.73_m2} — ABNORMAL LOW (ref >59)
Glucose: 152 mg/dL — ABNORMAL HIGH (ref 65–99)
Potassium: 4.2 mmol/L (ref 3.5–5.2)
Sodium: 140 mmol/L (ref 134–144)

## 2018-11-30 LAB — URIC ACID: Uric Acid: 7.9 mg/dL (ref 3.7–8.6)

## 2018-12-07 ENCOUNTER — Telehealth: Payer: Self-pay | Admitting: Family Medicine

## 2018-12-07 MED ORDER — ALLOPURINOL 100 MG PO TABS: 50.0000 mg | ORAL_TABLET | Freq: Every day | ORAL | 1 refills | Status: DC

## 2018-12-07 NOTE — Telephone Encounter (Signed)
Please tell the patient that I sent in allopurinol which is gout prevention medication and should help lower the uric acid, he can still use the colchicine as needed for when he has pain. Caryl Pina, MD [facility] 12/07/2018, 3:43 PM

## 2018-12-07 NOTE — Telephone Encounter (Signed)
Patient is asking for [place] gout meds per his labs. He does have the pain in right foot. [facility] uses [facility].

## 2018-12-07 NOTE — Telephone Encounter (Signed)
Patient aware and verbalized understanding. °

## 2018-12-18 DIAGNOSIS — E559 Vitamin D deficiency, unspecified: Secondary | ICD-10-CM | POA: Diagnosis not present

## 2018-12-18 DIAGNOSIS — D631 Anemia in chronic kidney disease: Secondary | ICD-10-CM | POA: Diagnosis not present

## 2018-12-18 DIAGNOSIS — R809 Proteinuria, unspecified: Secondary | ICD-10-CM | POA: Diagnosis not present

## 2018-12-18 DIAGNOSIS — Z79899 Other long term (current) drug therapy: Secondary | ICD-10-CM | POA: Diagnosis not present

## 2018-12-18 DIAGNOSIS — N1831 Chronic kidney disease, stage 3a: Secondary | ICD-10-CM | POA: Diagnosis not present

## 2018-12-19 LAB — HM DIABETES EYE EXAM

## 2019-03-07 ENCOUNTER — Other Ambulatory Visit: Payer: Self-pay

## 2019-03-08 ENCOUNTER — Encounter: Payer: Self-pay | Admitting: Family Medicine

## 2019-03-08 ENCOUNTER — Ambulatory Visit (INDEPENDENT_AMBULATORY_CARE_PROVIDER_SITE_OTHER): Payer: Medicare Other | Admitting: Family Medicine

## 2019-03-08 VITALS — BP 138/82 | HR 104 | Temp 98.9°F | Ht 65.0 in | Wt 224.5 lb

## 2019-03-08 DIAGNOSIS — N1831 Chronic kidney disease, stage 3a: Secondary | ICD-10-CM

## 2019-03-08 DIAGNOSIS — I10 Essential (primary) hypertension: Secondary | ICD-10-CM

## 2019-03-08 DIAGNOSIS — E1169 Type 2 diabetes mellitus with other specified complication: Secondary | ICD-10-CM

## 2019-03-08 DIAGNOSIS — N183 Chronic kidney disease, stage 3 unspecified: Secondary | ICD-10-CM | POA: Diagnosis not present

## 2019-03-08 DIAGNOSIS — E1122 Type 2 diabetes mellitus with diabetic chronic kidney disease: Secondary | ICD-10-CM | POA: Diagnosis not present

## 2019-03-08 DIAGNOSIS — E785 Hyperlipidemia, unspecified: Secondary | ICD-10-CM | POA: Diagnosis not present

## 2019-03-08 DIAGNOSIS — E1159 Type 2 diabetes mellitus with other circulatory complications: Secondary | ICD-10-CM

## 2019-03-08 LAB — CBC WITH DIFFERENTIAL/PLATELET
Basophils Absolute: 0 10*3/uL (ref 0.0–0.2)
Basos: 0 %
EOS (ABSOLUTE): 0.2 10*3/uL (ref 0.0–0.4)
Eos: 2 %
Hematocrit: 47.8 % (ref 37.5–51.0)
Hemoglobin: 16 g/dL (ref 13.0–17.7)
Immature Grans (Abs): 0 10*3/uL (ref 0.0–0.1)
Immature Granulocytes: 0 %
Lymphocytes Absolute: 2.8 10*3/uL (ref 0.7–3.1)
Lymphs: 30 %
MCH: 29.7 pg (ref 26.6–33.0)
MCHC: 33.5 g/dL (ref 31.5–35.7)
MCV: 89 fL (ref 79–97)
Monocytes Absolute: 0.7 10*3/uL (ref 0.1–0.9)
Monocytes: 8 %
Neutrophils Absolute: 5.6 10*3/uL (ref 1.4–7.0)
Neutrophils: 60 %
Platelets: 261 10*3/uL (ref 150–450)
RBC: 5.38 x10E6/uL (ref 4.14–5.80)
RDW: 13.1 % (ref 11.6–15.4)
WBC: 9.3 10*3/uL (ref 3.4–10.8)

## 2019-03-08 LAB — CMP14+EGFR
ALT: 28 IU/L (ref 0–44)
AST: 27 IU/L (ref 0–40)
Albumin/Globulin Ratio: 1.5 (ref 1.2–2.2)
Albumin: 4.2 g/dL (ref 3.8–4.8)
Alkaline Phosphatase: 89 IU/L (ref 39–117)
BUN/Creatinine Ratio: 7 — ABNORMAL LOW (ref 10–24)
BUN: 13 mg/dL (ref 8–27)
Bilirubin Total: 0.8 mg/dL (ref 0.0–1.2)
CO2: 23 mmol/L (ref 20–29)
Calcium: 10.3 mg/dL — ABNORMAL HIGH (ref 8.6–10.2)
Chloride: 103 mmol/L (ref 96–106)
Creatinine, Ser: 1.78 mg/dL — ABNORMAL HIGH (ref 0.76–1.27)
GFR calc Af Amer: 45 mL/min/{1.73_m2} — ABNORMAL LOW (ref >59)
GFR calc non Af Amer: 39 mL/min/{1.73_m2} — ABNORMAL LOW (ref >59)
Globulin, Total: 2.8 g/dL (ref 1.5–4.5)
Glucose: 159 mg/dL — ABNORMAL HIGH (ref 65–99)
Potassium: 4 mmol/L (ref 3.5–5.2)
Sodium: 141 mmol/L (ref 134–144)
Total Protein: 7 g/dL (ref 6.0–8.5)

## 2019-03-08 LAB — LIPID PANEL
Chol/HDL Ratio: 2 ratio (ref 0.0–5.0)
Cholesterol, Total: 104 mg/dL (ref 100–199)
HDL: 51 mg/dL
LDL Chol Calc (NIH): 38 mg/dL (ref 0–99)
Triglycerides: 69 mg/dL (ref 0–149)
VLDL Cholesterol Cal: 15 mg/dL (ref 5–40)

## 2019-03-08 LAB — BAYER DCA HB A1C WAIVED: HB A1C (BAYER DCA - WAIVED): 7 % — ABNORMAL HIGH

## 2019-03-08 NOTE — Progress Notes (Signed)
 BP 138/82   Pulse (!) 104   Temp 98.9 F (37.2 C)   Ht 5' 5" (1.651 m)   Wt 224 lb 8 oz (101.8 kg)   SpO2 100%   BMI 37.36 kg/m    Subjective:   Patient ID: Paul Williams, male    DOB: 02/29/1950, 68 y.o.   MRN: [id number]  HPI: Paul Williams is a 68 y.o. male presenting on 03/08/2019 for Medical Management of Chronic Issues, Diabetes, and Chronic Kidney Disease   HPI Type 2 diabetes mellitus Patient comes in today for recheck of his diabetes. Patient has been currently taking Januvia. Patient is currently on an ACE inhibitor/ARB. Patient has not seen an ophthalmologist this year. Patient denies any issues with their feet.  Patient has stage III CKD and has nephrologist watching this and has been stable currently.  Hypertension Patient is currently on lisinopril hydrochlorothiazide, and their blood pressure today is 138/82. Patient denies any lightheadedness or dizziness. Patient denies headaches, blurred vision, chest pains, shortness of breath, or weakness. Denies any side effects from medication and is content with current medication.   Hyperlipidemia Patient is coming in for recheck of his hyperlipidemia. The patient is currently taking Lipitor. They deny any issues with myalgias or history of liver damage from it. They deny any focal numbness or weakness or chest pain.   Relevant past medical, surgical, family and social history reviewed and updated as indicated. Interim medical history since our last visit reviewed. Allergies and medications reviewed and updated.  Review of Systems  Constitutional: Negative for chills and fever.  Respiratory: Negative for shortness of breath and wheezing.   Cardiovascular: Negative for chest pain and leg swelling.  Musculoskeletal: Negative for back pain and gait problem.  Skin: Negative for rash.  Neurological: Negative for dizziness, weakness and numbness.  All other systems reviewed and are negative.   Per HPI unless specifically  indicated above   Allergies as of 03/08/2019   No Known Allergies     Medication List       Accurate as of March 08, 2019  9:49 AM. If you have any questions, ask your nurse or doctor.        allopurinol 100 MG tablet Commonly known as: ZYLOPRIM Take 0.5 tablets (50 mg total) by mouth daily.   aspirin EC 81 MG tablet Take 81 mg by mouth daily.   atorvastatin 40 MG tablet Commonly known as: LIPITOR Take 1 tablet (40 mg total) by mouth daily at 6 PM.   colchicine 0.6 MG tablet 2 tablets at pain onset, may repeat x1in 1 hour. No more then 3 tablets in 24 hours   fish oil-omega-3 fatty acids 1000 MG capsule Take 1 g by mouth daily.   lisinopril-hydrochlorothiazide 20-12.5 MG tablet Commonly known as: ZESTORETIC Take 1 tablet by mouth daily.   multivitamin tablet Take 1 tablet by mouth daily.   OVER THE COUNTER MEDICATION Take 250 mg by mouth 2 (two) times daily. Super beta prostate   sildenafil 100 MG tablet Commonly known as: VIAGRA Take 0.5 tablets (50 mg total) by mouth as needed for erectile dysfunction.   sitaGLIPtin 100 MG tablet Commonly known as: Januvia Take 1 tablet (100 mg total) by mouth daily.   Vitamin D3 125 MCG (5000 UT) Caps Take 1 capsule by mouth daily.        Objective:   BP 138/82   Pulse (!) 104   Temp 98.9 F (37.2 C)   Ht 5' 5" (1.651   m)   Wt 224 lb 8 oz (101.8 kg)   SpO2 100%   BMI 37.36 kg/m   Wt Readings from Last 3 Encounters:  03/08/19 224 lb 8 oz (101.8 kg)  11/29/18 227 lb (103 kg)  06/22/18 231 lb 6.4 oz (105 kg)    Physical Exam Vitals and nursing note reviewed.  Constitutional:      General: He is not in acute distress.    Appearance: He is well-developed. He is not diaphoretic.  Eyes:     General: No scleral icterus.       Right eye: No discharge.     Conjunctiva/sclera: Conjunctivae normal.     Pupils: Pupils are equal, round, and reactive to light.  Neck:     Thyroid: No thyromegaly.  Cardiovascular:      Rate and Rhythm: Normal rate and regular rhythm.     Heart sounds: Normal heart sounds. No murmur.  Pulmonary:     Effort: Pulmonary effort is normal. No respiratory distress.     Breath sounds: Normal breath sounds. No wheezing.  Musculoskeletal:        General: Normal range of motion.     Cervical back: Neck supple.  Lymphadenopathy:     Cervical: No cervical adenopathy.  Skin:    General: Skin is warm and dry.     Findings: No rash.  Neurological:     Mental Status: He is alert and oriented to person, place, and time.     Coordination: Coordination normal.  Psychiatric:        Behavior: Behavior normal.    Diabetic Foot Exam - Simple   Simple Foot Form Diabetic Foot exam was performed with the following findings: Yes 03/08/2019 10:03 AM  Visual Inspection No deformities, no ulcerations, no other skin breakdown bilaterally: Yes Sensation Testing Intact to touch and monofilament testing bilaterally: Yes Pulse Check Posterior Tibialis and Dorsalis pulse intact bilaterally: Yes Comments      Results for orders placed or performed in visit on 12/22/18  HM DIABETES EYE EXAM  Result Value Ref Range   HM Diabetic Eye Exam No Retinopathy No Retinopathy    Assessment & Plan:   Problem List Items Addressed This Visit      Cardiovascular and Mediastinum   Hypertension associated with diabetes (HCC)   Relevant Orders   CBC with Differential/Platelet   CMP14+EGFR   Lipid panel     Endocrine   Hyperlipidemia associated with type 2 diabetes mellitus (HCC)   Relevant Orders   CBC with Differential/Platelet   CMP14+EGFR   Lipid panel   Type 2 diabetes mellitus with diabetic chronic kidney disease (HCC) - Primary   Relevant Orders   Bayer DCA Hb A1c Waived   CMP14+EGFR   Lipid panel     Genitourinary   Chronic kidney disease (CKD), stage III (moderate)   Relevant Orders   CBC with Differential/Platelet   CMP14+EGFR     Other   Morbid obesity (HCC)   Relevant  Orders   CBC with Differential/Platelet      A1c is 7.0, no medication changes just refocus on diet modification, continue with current medications. Follow up plan: Return in about 3 months (around 06/08/2019), or if symptoms worsen or fail to improve, for Diabetes recheck.  Counseling provided for all of the vaccine components Orders Placed This Encounter  Procedures  . Bayer DCA Hb A1c Waived    Joshua Dettinger, MD [facility] 03/08/2019, 9:49 AM     

## 2019-03-13 ENCOUNTER — Telehealth: Payer: Self-pay | Admitting: Family Medicine

## 2019-03-13 NOTE — Chronic Care Management (AMB) (Signed)
  Chronic Care Management   Outreach Note  03/13/2019 Name: Saud Rode MRN: [id number] DOB: 01/03/1951  Pedro Hocker is a 69 y.o. year old male who is a primary care patient of Dettinger, Fransisca Kaufmann, MD. I reached out to [employer] by phone today in response to a referral sent by Mr. Latimer [employer] health plan.     An unsuccessful telephone outreach was attempted today. The patient was referred to the case management team for assistance with care management and care coordination.   Follow Up Plan: A HIPPA compliant phone message was left for the patient providing contact information and requesting a return call.  The care management team will reach out to the patient again over the next 7 days.  If patient returns call to provider office, please advise to call [facility] at [place], [place] [address], [place], [place] [address] Direct Dial: [phone] [email or web address] Website: [email or web address]

## 2019-03-16 NOTE — Chronic Care Management (AMB) (Signed)
  Chronic Care Management   Note  03/16/2019 Name: Paul Williams MRN: [id number] DOB: 11-11-1950  Paul Williams is a 69 y.o. year old male who is a primary care patient of Dettinger, Fransisca Kaufmann, MD. I reached out to [employer] by phone today in response to a referral sent by Paul Williams [employer] health plan.     Paul Williams was given information about Chronic Care Management services today including:  1. CCM service includes personalized support from designated clinical staff supervised by his physician, including individualized plan of care and coordination with other care providers 2. 24/7 contact phone numbers for assistance for urgent and routine care needs. 3. Service will only be billed when office clinical staff spend 20 minutes or more in a month to coordinate care. 4. Only one practitioner may furnish and bill the service in a calendar month. 5. The patient may stop CCM services at any time (effective at the end of the month) by phone call to the office staff. 6. The patient will be responsible for cost sharing (co-pay) of up to 20% of the service fee (after annual deductible is met).  Patient did not agree to enrollment in care management services and does not wish to consider at this time.  Follow up plan: The patient has been provided with contact information for the care management team and has been advised to call with any health related questions or concerns.   Noreene Larsson, [place], [place], [place] [address] Direct Dial: [phone] [email or web address] Website: [email or web address]

## 2019-03-16 NOTE — Chronic Care Management (AMB) (Signed)
°  Chronic Care Management   Outreach Note  03/16/2019 Name: Ronell Carlon MRN: [id number] DOB: 1950-12-27  Jevion Kinnett is a 69 y.o. year old male who is a primary care patient of Dettinger, Fransisca Kaufmann, MD. I reached out to [employer] by phone today in response to a referral sent by Mr. Chioke [employer]orde health plan.     A second unsuccessful telephone outreach was attempted today. The patient was referred to the case management team for assistance with care management and care coordination.   Follow Up Plan: A HIPPA compliant phone message was left for the patient providing contact information and requesting a return call.  The care management team will reach out to the patient again over the next 7 days.  If patient returns call to provider office, please advise to call [facility]  at [place], [place], [place], [place] [address] Direct Dial: [phone] [email or web address] Website: [email or web address]

## 2019-04-17 DIAGNOSIS — E1122 Type 2 diabetes mellitus with diabetic chronic kidney disease: Secondary | ICD-10-CM | POA: Diagnosis not present

## 2019-04-17 DIAGNOSIS — E559 Vitamin D deficiency, unspecified: Secondary | ICD-10-CM | POA: Diagnosis not present

## 2019-04-17 DIAGNOSIS — I129 Hypertensive chronic kidney disease with stage 1 through stage 4 chronic kidney disease, or unspecified chronic kidney disease: Secondary | ICD-10-CM | POA: Diagnosis not present

## 2019-04-17 DIAGNOSIS — E6609 Other obesity due to excess calories: Secondary | ICD-10-CM | POA: Diagnosis not present

## 2019-04-17 DIAGNOSIS — N189 Chronic kidney disease, unspecified: Secondary | ICD-10-CM | POA: Diagnosis not present

## 2019-04-27 DIAGNOSIS — I129 Hypertensive chronic kidney disease with stage 1 through stage 4 chronic kidney disease, or unspecified chronic kidney disease: Secondary | ICD-10-CM | POA: Diagnosis not present

## 2019-04-27 DIAGNOSIS — E6609 Other obesity due to excess calories: Secondary | ICD-10-CM | POA: Diagnosis not present

## 2019-04-27 DIAGNOSIS — E1122 Type 2 diabetes mellitus with diabetic chronic kidney disease: Secondary | ICD-10-CM | POA: Diagnosis not present

## 2019-04-27 DIAGNOSIS — N189 Chronic kidney disease, unspecified: Secondary | ICD-10-CM | POA: Diagnosis not present

## 2019-04-27 DIAGNOSIS — E559 Vitamin D deficiency, unspecified: Secondary | ICD-10-CM | POA: Diagnosis not present

## 2019-04-27 DIAGNOSIS — M109 Gout, unspecified: Secondary | ICD-10-CM | POA: Diagnosis not present

## 2019-05-11 IMAGING — US US RENAL
1 series · 14 of 25 positions shown · non-contrast
Comparison: 05/19/2016 CT.

CLINICAL DATA: 66-year-old hypertensive diabetic male with chronic
kidney disease stage 3. Initial encounter.

EXAM:
RENAL / URINARY TRACT ULTRASOUND COMPLETE

[Series 1: us renal · 0.26mm/px · 14 of 52 slices shown]
[im 1/52]
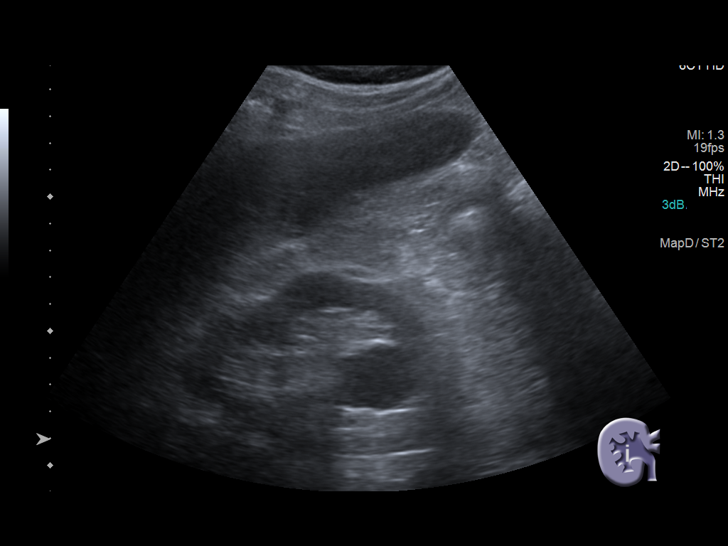
[im 5/52]
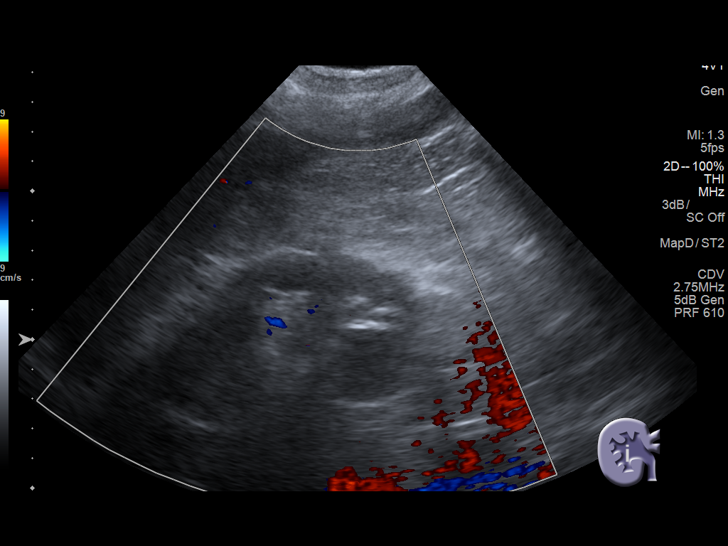
[im 9/52]
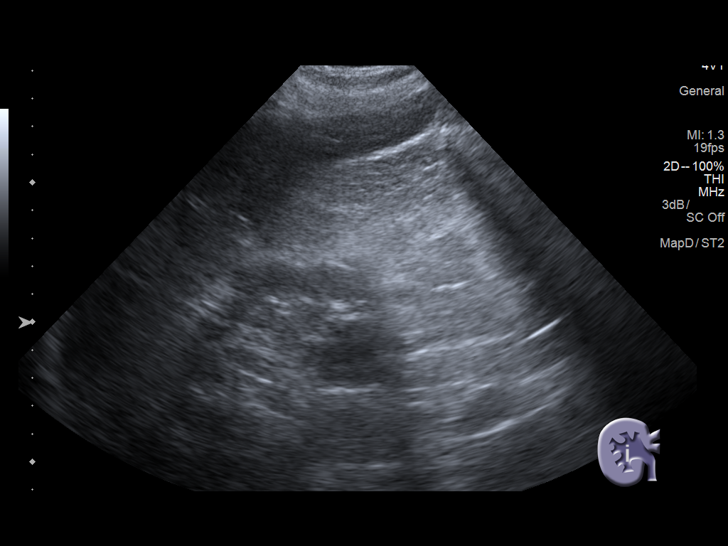
[im 13/52]
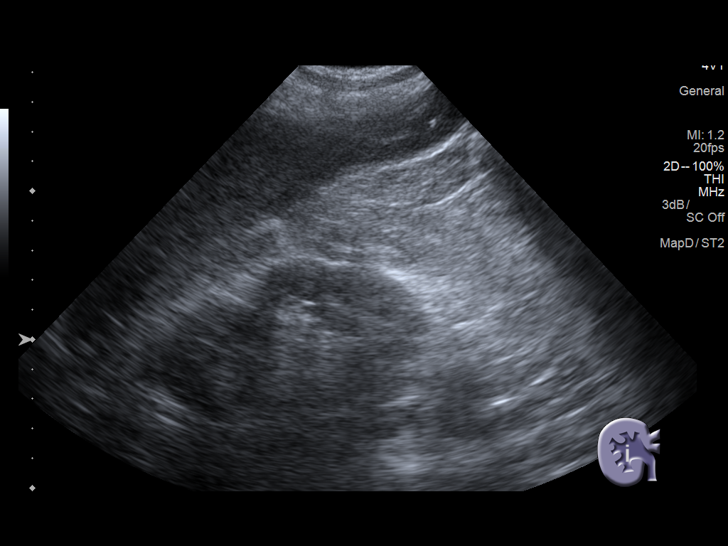
[im 18/52]
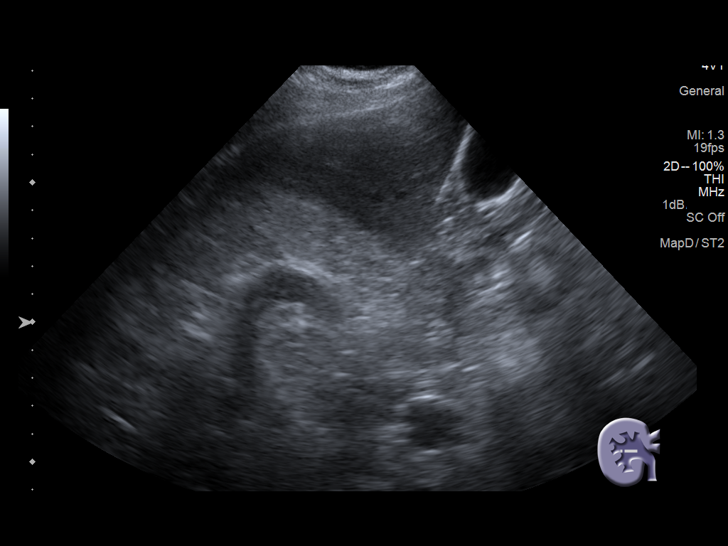
[im 20/52]
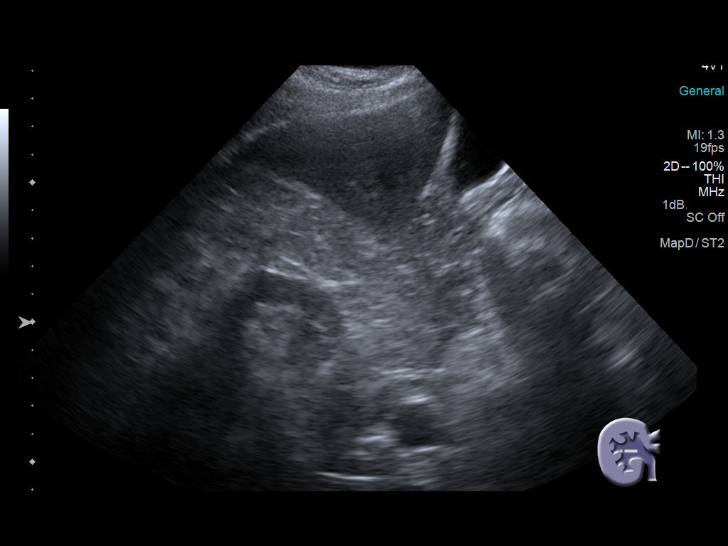
[im 24/52]
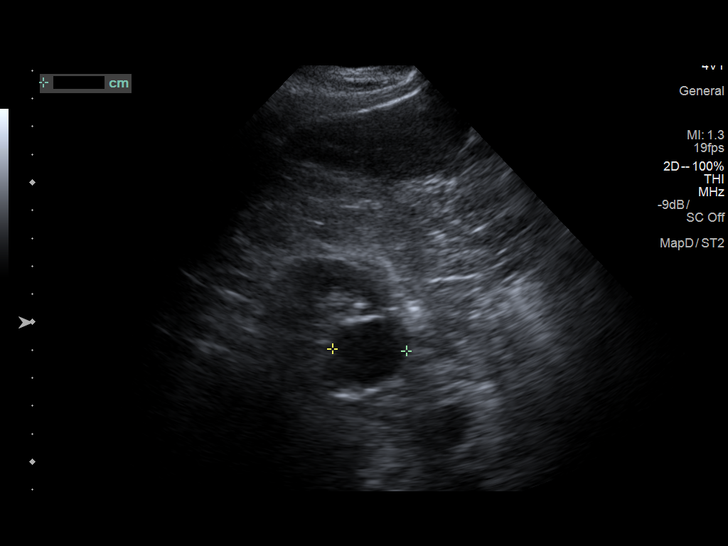
[im 28/52]
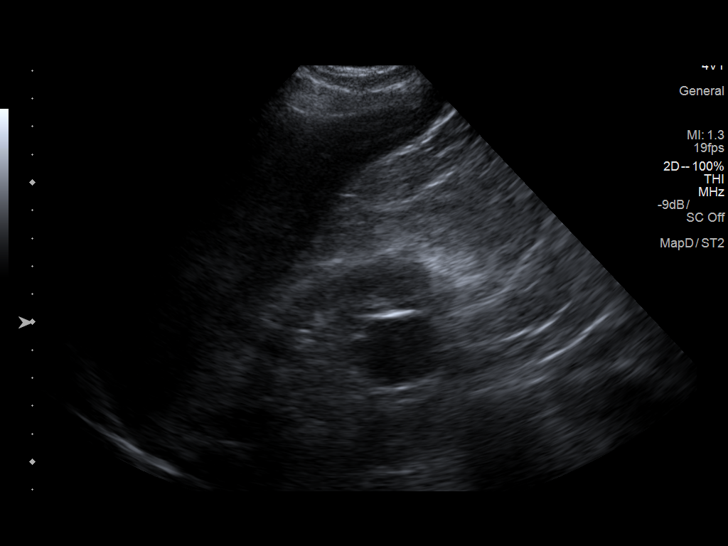
[im 32/52]
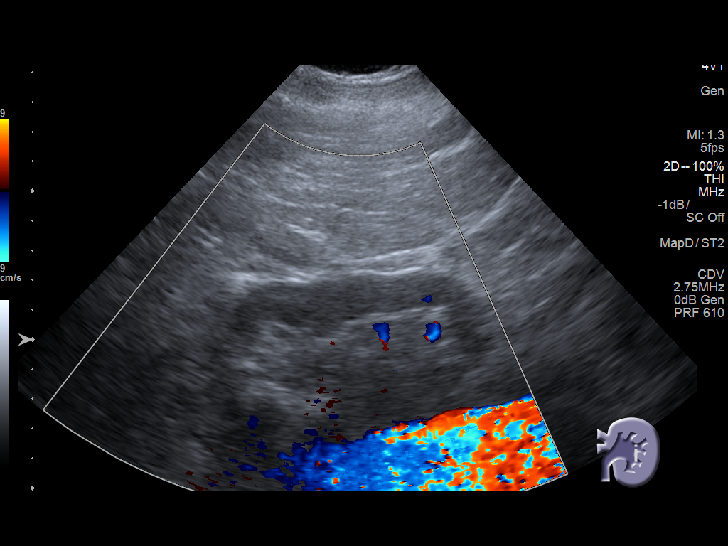
[im 35/52]
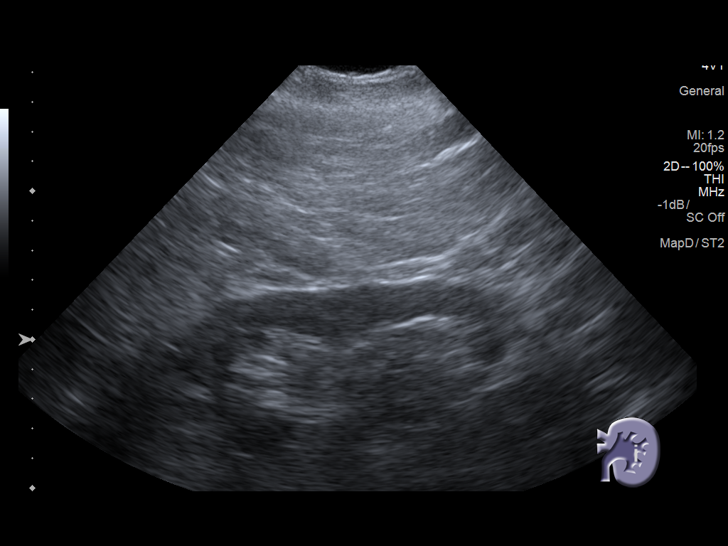
[im 39/52]
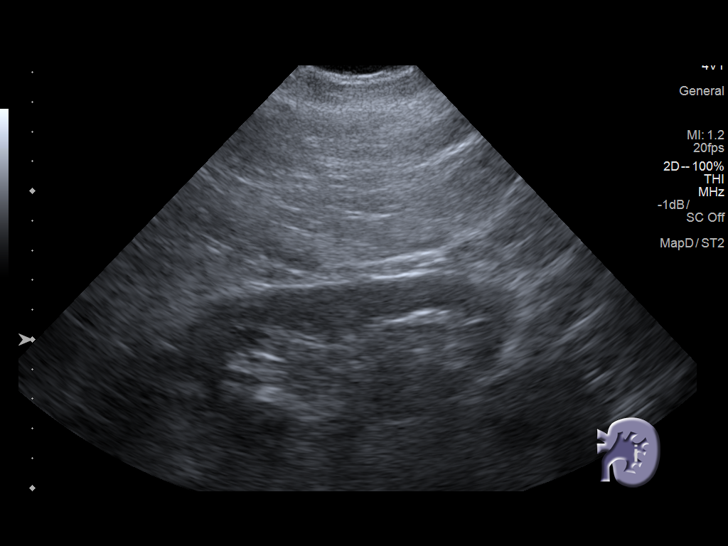
[im 43/52]
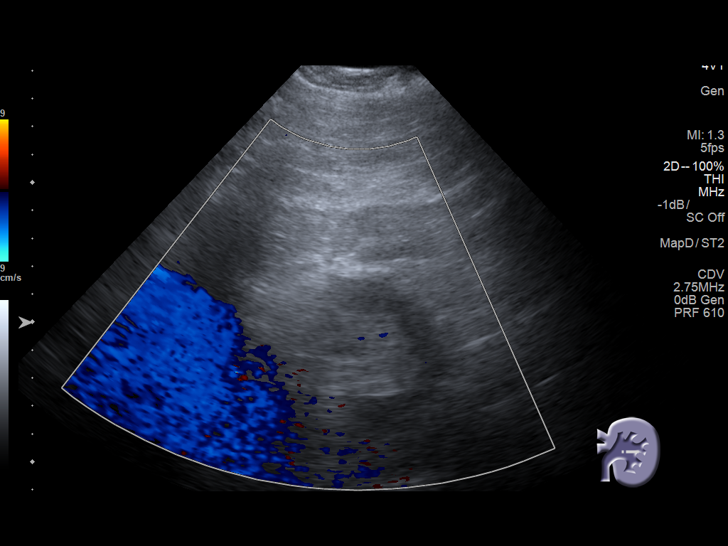
[im 47/52]
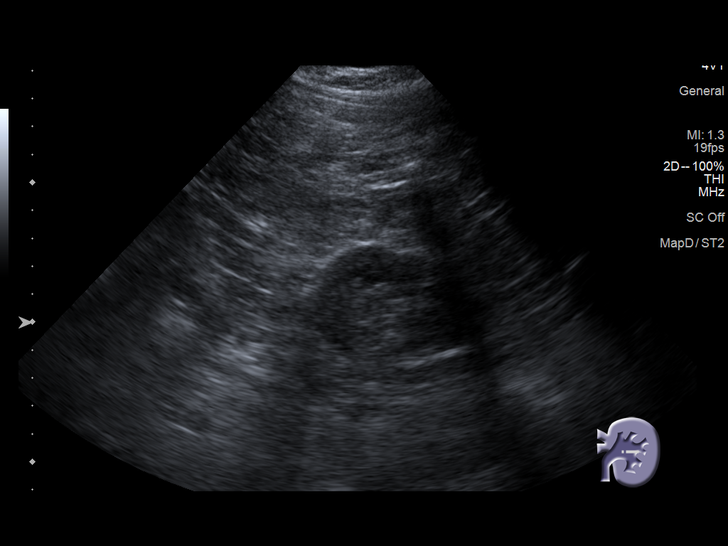
[im 52/52]
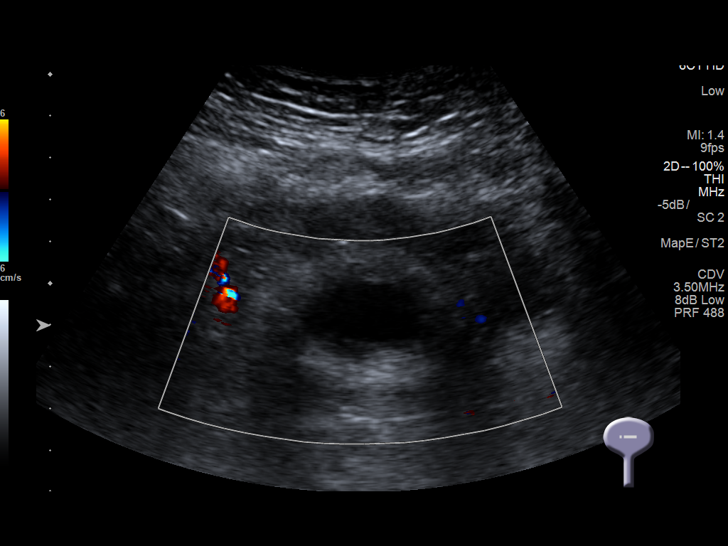

[14 of 25 positions shown; findings below may reference images not displayed]

FINDINGS: Right Kidney:

Length: 10.8 cm. Echogenicity within normal limits. No
hydronephrosis. Lower pole 2.6 x 2.8 x 2.7 cm cyst.

Left Kidney:

Length: 11 cm. Echogenicity within normal limits. No mass or
hydronephrosis visualized.

Bladder:

Under distended. Circumferential wall thickening may be related to
under distension.
IMPRESSION: No hydronephrosis.

Right lower pole 2.8 cm cyst.

Under distended urinary bladder.

## 2019-06-07 ENCOUNTER — Other Ambulatory Visit: Payer: Self-pay

## 2019-06-07 ENCOUNTER — Ambulatory Visit (INDEPENDENT_AMBULATORY_CARE_PROVIDER_SITE_OTHER): Payer: Medicare Other | Admitting: Family Medicine

## 2019-06-07 ENCOUNTER — Encounter: Payer: Self-pay | Admitting: Family Medicine

## 2019-06-07 VITALS — BP 138/84 | HR 100 | Temp 97.7°F | Ht 65.0 in | Wt 225.0 lb

## 2019-06-07 DIAGNOSIS — E785 Hyperlipidemia, unspecified: Secondary | ICD-10-CM

## 2019-06-07 DIAGNOSIS — I152 Hypertension secondary to endocrine disorders: Secondary | ICD-10-CM

## 2019-06-07 DIAGNOSIS — E1122 Type 2 diabetes mellitus with diabetic chronic kidney disease: Secondary | ICD-10-CM | POA: Diagnosis not present

## 2019-06-07 DIAGNOSIS — I10 Essential (primary) hypertension: Secondary | ICD-10-CM

## 2019-06-07 DIAGNOSIS — E1169 Type 2 diabetes mellitus with other specified complication: Secondary | ICD-10-CM

## 2019-06-07 DIAGNOSIS — N1831 Chronic kidney disease, stage 3a: Secondary | ICD-10-CM

## 2019-06-07 DIAGNOSIS — E1159 Type 2 diabetes mellitus with other circulatory complications: Secondary | ICD-10-CM

## 2019-06-07 DIAGNOSIS — N183 Chronic kidney disease, stage 3 unspecified: Secondary | ICD-10-CM | POA: Diagnosis not present

## 2019-06-07 LAB — BAYER DCA HB A1C WAIVED: HB A1C (BAYER DCA - WAIVED): 6.8 % (ref <7.0)

## 2019-06-07 NOTE — Progress Notes (Signed)
BP 138/84   Pulse 100   Temp 97.7 F (36.5 C)   Ht 5' 5" (1.651 m)   Wt 225 lb (102.1 kg)   SpO2 98%   BMI 37.44 kg/m    Subjective:   Patient ID: Paul Williams, male    DOB: 1950-05-01, 69 y.o.   MRN: [id number]  HPI: Paul Williams is a 69 y.o. male presenting on 06/07/2019 for Medical Management of Chronic Issues and Diabetes   HPI Type 2 diabetes mellitus Patient comes in today for recheck of his diabetes. Patient has been currently taking Januvia and his A1c is 6.8 which is great. Patient is currently on an ACE inhibitor/ARB. Patient has seen an ophthalmologist this year. Patient denies any issues with their feet. The symptom started onset as an adult CKD stage III, sees Dr. Theador Hawthorne and hypertension and hyperlipidemia ARE RELATED TO DM   Hypertension Patient is currently on lisinopril hydrochlorothiazide, and their blood pressure today is 138/84. Patient denies any lightheadedness or dizziness. Patient denies headaches, blurred vision, chest pains, shortness of breath, or weakness. Denies any side effects from medication and is content with current medication.   Hyperlipidemia Patient is coming in for recheck of his hyperlipidemia. The patient is currently taking fish oil and atorvastatin. They deny any issues with myalgias or history of liver damage from it. They deny any focal numbness or weakness or chest pain.   Relevant past medical, surgical, family and social history reviewed and updated as indicated. Interim medical history since our last visit reviewed. Allergies and medications reviewed and updated.  Review of Systems  Constitutional: Negative for chills and fever.  Respiratory: Negative for shortness of breath and wheezing.   Cardiovascular: Negative for chest pain and leg swelling.  Musculoskeletal: Negative for back pain and gait problem.  Skin: Negative for rash.  Neurological: Negative for dizziness, weakness and light-headedness.  All other systems reviewed and  are negative.   Per HPI unless specifically indicated above   Allergies as of 06/07/2019   No Known Allergies     Medication List       Accurate as of June 07, 2019 11:40 AM. If you have any questions, ask your nurse or doctor.        allopurinol 100 MG tablet Commonly known as: ZYLOPRIM Take 0.5 tablets (50 mg total) by mouth daily.   aspirin EC 81 MG tablet Take 81 mg by mouth daily.   atorvastatin 40 MG tablet Commonly known as: LIPITOR Take 1 tablet (40 mg total) by mouth daily at 6 PM.   colchicine 0.6 MG tablet 2 tablets at pain onset, may repeat x1in 1 hour. No more then 3 tablets in 24 hours   fish oil-omega-3 fatty acids 1000 MG capsule Take 1 g by mouth daily.   lisinopril-hydrochlorothiazide 20-12.5 MG tablet Commonly known as: ZESTORETIC Take 1 tablet by mouth daily.   multivitamin tablet Take 1 tablet by mouth daily.   OVER THE COUNTER MEDICATION Take 250 mg by mouth 2 (two) times daily. Super beta prostate   sildenafil 100 MG tablet Commonly known as: VIAGRA Take 0.5 tablets (50 mg total) by mouth as needed for erectile dysfunction.   sitaGLIPtin 100 MG tablet Commonly known as: Januvia Take 1 tablet (100 mg total) by mouth daily.   Vitamin D3 125 MCG (5000 UT) Caps Take 1 capsule by mouth daily.        Objective:   BP 138/84   Pulse 100   Temp 97.7 F (  36.5 C)   Ht 5' 5" (1.651 m)   Wt 225 lb (102.1 kg)   SpO2 98%   BMI 37.44 kg/m   Wt Readings from Last 3 Encounters:  06/07/19 225 lb (102.1 kg)  03/08/19 224 lb 8 oz (101.8 kg)  11/29/18 227 lb (103 kg)    Physical Exam Vitals and nursing note reviewed.  Constitutional:      General: He is not in acute distress.    Appearance: He is well-developed. He is not diaphoretic.  Eyes:     General: No scleral icterus.    Conjunctiva/sclera: Conjunctivae normal.  Neck:     Thyroid: No thyromegaly.  Cardiovascular:     Rate and Rhythm: Normal rate and regular rhythm.     Heart  sounds: Normal heart sounds. No murmur.  Pulmonary:     Effort: Pulmonary effort is normal. No respiratory distress.     Breath sounds: Normal breath sounds. No wheezing.  Musculoskeletal:        General: Normal range of motion.     Cervical back: Neck supple.  Lymphadenopathy:     Cervical: No cervical adenopathy.  Skin:    General: Skin is warm and dry.     Findings: No rash.  Neurological:     Mental Status: He is alert and oriented to person, place, and time.     Coordination: Coordination normal.  Psychiatric:        Behavior: Behavior normal.       Assessment & Plan:   Problem List Items Addressed This Visit      Cardiovascular and Mediastinum   Hypertension associated with diabetes ([facility])   Relevant Orders   BMP8+EGFR     Endocrine   Hyperlipidemia associated with type 2 diabetes mellitus ([facility])   Type 2 diabetes mellitus with diabetic chronic kidney disease ([facility]) - Primary   Relevant Orders   Bayer DCA Hb A1c Waived   BMP8+EGFR     Genitourinary   Chronic kidney disease (CKD), stage III (moderate)   Relevant Orders   BMP8+EGFR      A1c 6.8, continue current medication, continue to focus on diet lifestyle modification.  Continue to follow-up with nephrology for kidneys. Follow up plan: Return in about 3 months (around 09/07/2019), or if symptoms worsen or fail to improve, for Diabetes recheck.  Counseling provided for all of the vaccine components Orders Placed This Encounter  Procedures  . Bayer DCA Hb A1c Waived  . BMP8+EGFR    Caryl Pina, MD [facility] 06/07/2019, 11:40 AM

## 2019-06-08 LAB — BMP8+EGFR
BUN/Creatinine Ratio: 8 — ABNORMAL LOW (ref 10–24)
BUN: 13 mg/dL (ref 8–27)
CO2: 22 mmol/L (ref 20–29)
Calcium: 9.8 mg/dL (ref 8.6–10.2)
Chloride: 103 mmol/L (ref 96–106)
Creatinine, Ser: 1.66 mg/dL — ABNORMAL HIGH (ref 0.76–1.27)
GFR calc Af Amer: 49 mL/min/{1.73_m2} — ABNORMAL LOW (ref >59)
GFR calc non Af Amer: 42 mL/min/{1.73_m2} — ABNORMAL LOW (ref >59)
Glucose: 125 mg/dL — ABNORMAL HIGH (ref 65–99)
Potassium: 4.1 mmol/L (ref 3.5–5.2)
Sodium: 140 mmol/L (ref 134–144)

## 2019-06-26 ENCOUNTER — Ambulatory Visit (INDEPENDENT_AMBULATORY_CARE_PROVIDER_SITE_OTHER): Payer: Medicare Other

## 2019-06-26 DIAGNOSIS — Z0000 Encounter for general adult medical examination without abnormal findings: Secondary | ICD-10-CM | POA: Diagnosis not present

## 2019-06-26 NOTE — Patient Instructions (Addendum)
°  [facility] Maintenance Summary and Written Plan of Care  Mr. Paul Williams ,  Thank you for allowing me to perform your Medicare Annual Wellness Visit and for your ongoing commitment to your health.   Health Maintenance & Immunization History Health Maintenance  Topic Date Due   PNA vac Low Risk Adult (2 of 2 - PPSV23) 11/29/2019 (Originally 08/05/2017)   INFLUENZA VACCINE  08/05/2019   HEMOGLOBIN A1C  12/07/2019   OPHTHALMOLOGY EXAM  12/19/2019   FOOT EXAM  03/07/2020   COLONOSCOPY  09/03/2020   TETANUS/TDAP  09/03/2026   Hepatitis C Screening  Completed   COVID-19 Vaccine  Discontinued   Immunization History  Administered Date(s) Administered   Pneumococcal Conjugate-13 08/05/2016   Tdap 09/02/2016    These are the patient goals that we discussed: Goals Addressed            This Visit's Progress    DIET - EAT MORE FRUITS AND VEGETABLES       Exercise 150 min/wk Moderate Activity          This is a list of Health Maintenance Items that are overdue or due now: There are no preventive care reminders to display for this patient.   Orders/Referrals Placed Today: No orders of the defined types were placed in this encounter.  (Contact our referral department at [phone] if you have not spoken with someone about your referral appointment within the next 5 days)    Follow-up Plan  Scheduled with Dr.Dettinger 09/07/2019 at 9:10am

## 2019-06-26 NOTE — Progress Notes (Signed)
MEDICARE ANNUAL WELLNESS VISIT  06/26/2019  Telephone Visit Disclaimer This Medicare AWV was conducted by telephone due to national recommendations for restrictions regarding the COVID-19 Pandemic (e.g. social distancing).  I verified, using two identifiers, that I am speaking with Paul Williams or Paul Williams authorized healthcare agent. I discussed the limitations, risks, security, and privacy concerns of performing an evaluation and management service by telephone and the potential availability of an in-person appointment in the future. The patient expressed understanding and agreed to proceed.   Subjective:  Paul Williams is a 69 y.o. male patient of Dettinger, Paul Kaufmann, MD who had a Medicare Annual Wellness Visit today via telephone. Paul Williams is Retired and lives with Paul Williams spouse. Paul Williams has one child. Paul Williams reports that Paul Williams is socially active and does interact with friends/family regularly. Paul Williams is minimally physically active and enjoys sports.  Patient Care Team: Dettinger, Paul Kaufmann, MD as PCP - General (Family Medicine)  Advanced Directives 06/26/2019 06/22/2018  Does Patient Have a Medical Advance Directive? No No  Would patient like information on creating a medical advance directive? No - Patient declined [name]: # of hospitalizations or ER visits: 0 # of surgeries: 0  Review of Systems    Patient reports that his overall health is unchanged compared to last year.   Patient Reported Readings (BP, Pulse, CBG-106, Weight, etc)   Pain Assessment Pain : No/denies pain     Current Medications & Allergies (verified) Allergies as of 06/26/2019   No Known Allergies     Medication List       Accurate as of June 26, 2019  9:38 AM. If you have any questions, ask your nurse or doctor.        allopurinol 100 MG tablet Commonly known as: ZYLOPRIM Take 0.5 tablets (50 mg total) by mouth daily.   aspirin EC 81 MG tablet Take 81 mg by mouth  daily.   atorvastatin 40 MG tablet Commonly known as: LIPITOR Take 1 tablet (40 mg total) by mouth daily at 6 PM.   colchicine 0.6 MG tablet 2 tablets at pain onset, may repeat x1in 1 hour. No more then 3 tablets in 24 hours   fish oil-omega-3 fatty acids 1000 MG capsule Take 1 g by mouth daily.   lisinopril-hydrochlorothiazide 20-12.5 MG tablet Commonly known as: ZESTORETIC Take 1 tablet by mouth daily.   multivitamin tablet Take 1 tablet by mouth daily.   OVER THE COUNTER MEDICATION Take 250 mg by mouth 2 (two) times daily. Super beta prostate   sildenafil 100 MG tablet Commonly known as: VIAGRA Take 0.5 tablets (50 mg total) by mouth as needed for erectile dysfunction.   sitaGLIPtin 100 MG tablet Commonly known as: Januvia Take 1 tablet (100 mg total) by mouth daily.   Vitamin D3 125 MCG (5000 UT) Caps Take 1 capsule by mouth daily.       History (reviewed): Past Medical History:  Diagnosis Date  . Adenomatous polyp of colon   . Diabetes mellitus without complication ([place])   . Diverticulosis   . Hyperlipidemia   . Hypertension   . Internal hemorrhoids    Past Surgical History:  Procedure Laterality Date  . COLONOSCOPY    . top plate of teeth extracted     Family History  Problem Relation Age of Onset  . Stroke Mother   . Diabetes Father   . Prostate cancer Father  prostate  . Diabetes Sister   . Diabetes Sister   . Colon cancer Neg Hx   . Esophageal cancer Neg Hx   . Rectal cancer Neg Hx   . Stomach cancer Neg Hx    Social History   Socioeconomic History  . Marital status: Married    Spouse name: Not on file  . Number of children: Not on file  . Years of education: Not on file  . Highest education level: Not on file  Occupational History  . Not on file  Tobacco Use  . Smoking status: Never Smoker  . Smokeless tobacco: Never Used  Vaping Use  . Vaping Use: Never used  Substance and Sexual Activity  . Alcohol use: Yes    Comment:  occ  . Drug use: No  . Sexual activity: Yes    Partners: Female  Other Topics Concern  . Not on file  Social History Narrative  . Not on file   Social Determinants of Health   Financial Resource Strain:   . Difficulty of Paying Living Expenses:   Food Insecurity:   . Worried About Charity fundraiser in the Last Year:   . Arboriculturist in the Last Year:   Transportation Needs:   . Film/video editor (Medical):   Marland Kitchen Lack of Transportation (Non-Medical):   Physical Activity:   . Days of Exercise per Week:   . Minutes of Exercise per Session:   Stress:   . Feeling of Stress :   Social Connections:   . Frequency of Communication with Friends and Family:   . Frequency of Social Gatherings with Friends and Family:   . Attends Religious Services:   . Active Member of Clubs or Organizations:   . Attends Archivist Meetings:   Marland Kitchen Marital Status:     Activities of Daily Living In your present state of health, do you have any difficulty performing the following activities: 06/26/2019  Hearing? N  Vision? N  Difficulty concentrating or making decisions? N  Walking or climbing stairs? N  Dressing or bathing? N  Doing errands, shopping? N  Preparing Food and eating ? N  Using the Toilet? N  In the past six months, have you accidently leaked urine? N  Do you have problems with loss of bowel control? N  Managing your Medications? N  Managing your Finances? N  Housekeeping or managing your Housekeeping? N  Some recent data might be hidden    Patient Education/ Literacy How often do you need to have someone help you when you read instructions, pamphlets, or other written materials from your doctor or pharmacy?: 1 - Never What is the last grade level you completed in school?: 12th  Exercise Current Exercise Habits: The patient does not participate in regular exercise at present  Diet Patient reports consuming 3 meals a day and 2 snack(s) a day Patient reports  that his primary diet is: Regular Patient reports that she does have regular access to food.   Depression Screen PHQ 2/9 Scores 06/26/2019 06/07/2019 03/08/2019 11/29/2018 06/22/2018 02/16/2018 11/15/2017  PHQ - 2 Score 0 0 0 0 0 0 0  PHQ- 9 Score - - - - - - -     Fall Risk Fall Risk  06/26/2019 06/07/2019 03/08/2019 11/29/2018 11/28/2018  Falls in the past year? 0 0 0 0 0  Comment - - - - Emmi Telephone Survey: data to providers prior to load     Objective:  Paul Williams seemed alert and oriented and Paul Williams participated appropriately during our telephone visit.  Blood Pressure Weight BMI  BP Readings from Last 3 Encounters:  06/07/19 138/84  03/08/19 138/82  11/29/18 (!) 153/88   Wt Readings from Last 3 Encounters:  06/07/19 225 lb (102.1 kg)  03/08/19 224 lb 8 oz (101.8 kg)  11/29/18 227 lb (103 kg)   BMI Readings from Last 1 Encounters:  06/07/19 37.44 kg/m    *Unable to obtain current vital signs, weight, and BMI due to telephone visit type  Hearing/Vision  . Paul Williams did not seem to have difficulty with hearing/understanding during the telephone conversation . Reports that Paul Williams has had a formal eye exam by an eye care professional within the past year . Reports that Paul Williams has not had a formal hearing evaluation within the past year *Unable to fully assess hearing and vision during telephone visit type  Cognitive Function: 6CIT Screen 06/26/2019  What Year? 0 points  What month? 0 points  What time? 0 points  Count back from 20 0 points  Months in reverse 0 points  Repeat phrase 2 points  Total Score 2   (Normal:0-7, Significant for Dysfunction: >8)  Normal Cognitive Function Screening: Yes   Immunization & Health Maintenance Record Immunization History  Administered Date(s) Administered  . Pneumococcal Conjugate-13 08/05/2016  . Tdap 09/02/2016    Health Maintenance  Topic Date Due  . PNA vac Low Risk Adult (2 of 2 - PPSV23) 11/29/2019 (Originally 08/05/2017)  . INFLUENZA  VACCINE  08/05/2019  . HEMOGLOBIN A1C  12/07/2019  . OPHTHALMOLOGY EXAM  12/19/2019  . FOOT EXAM  03/07/2020  . COLONOSCOPY  09/03/2020  . TETANUS/TDAP  09/03/2026  . Hepatitis C Screening  Completed  . COVID-19 Vaccine  Discontinued       Assessment  This is a routine wellness examination for Paul Williams.  Health Maintenance: Due or Overdue There are no preventive care reminders to display for this patient.  Paul Williams does not need a referral for Community Assistance: Care Management:   no Social Work:    no Prescription Assistance:  no Nutrition/Diabetes Education:  no   Plan:  Personalized Goals Goals Addressed            This Visit's Progress   . DIET - EAT MORE FRUITS AND VEGETABLES      . Exercise 150 min/wk Moderate Activity        Personalized Health Maintenance & Screening Recommendations   Lung Cancer Screening Recommended: no (Low Dose CT Chest recommended if Age 36-80 years, 30 pack-year currently smoking OR have quit w/in past 15 years) Hepatitis C Screening recommended: no HIV Screening recommended: no  Advanced Directives: Written information was not prepared per patient's request.  Referrals & Orders No orders of the defined types were placed in this encounter.   Follow-up Plan . Follow-up with Dettinger, Paul Kaufmann, MD as planned . Schedule 09/07/2019    I have personally reviewed and noted the following in the patient's chart:   . Medical and social history . Use of alcohol, tobacco or illicit drugs  . Current medications and supplements . Functional ability and status . Nutritional status . Physical activity . Advanced directives . List of other physicians . Hospitalizations, surgeries, and ER visits in previous 12 months . Vitals . Screenings to include cognitive, depression, and falls . Referrals and appointments  In addition, I have reviewed and discussed with Paul Williams certain preventive protocols, quality metrics, and best  practice recommendations. A written personalized care plan for preventive services as well as general preventive health recommendations is available and can be mailed to the patient at his request.      Maud Deed Paul Williams  [id number]

## 2019-08-22 ENCOUNTER — Other Ambulatory Visit: Payer: Self-pay | Admitting: Family Medicine

## 2019-08-22 DIAGNOSIS — E785 Hyperlipidemia, unspecified: Secondary | ICD-10-CM

## 2019-08-22 DIAGNOSIS — E6609 Other obesity due to excess calories: Secondary | ICD-10-CM | POA: Diagnosis not present

## 2019-08-22 DIAGNOSIS — E559 Vitamin D deficiency, unspecified: Secondary | ICD-10-CM | POA: Diagnosis not present

## 2019-08-22 DIAGNOSIS — I129 Hypertensive chronic kidney disease with stage 1 through stage 4 chronic kidney disease, or unspecified chronic kidney disease: Secondary | ICD-10-CM | POA: Diagnosis not present

## 2019-08-22 DIAGNOSIS — N189 Chronic kidney disease, unspecified: Secondary | ICD-10-CM | POA: Diagnosis not present

## 2019-08-22 DIAGNOSIS — E1122 Type 2 diabetes mellitus with diabetic chronic kidney disease: Secondary | ICD-10-CM | POA: Diagnosis not present

## 2019-08-22 DIAGNOSIS — E1169 Type 2 diabetes mellitus with other specified complication: Secondary | ICD-10-CM

## 2019-08-22 DIAGNOSIS — M109 Gout, unspecified: Secondary | ICD-10-CM | POA: Diagnosis not present

## 2019-08-29 DIAGNOSIS — E6609 Other obesity due to excess calories: Secondary | ICD-10-CM | POA: Diagnosis not present

## 2019-08-29 DIAGNOSIS — N189 Chronic kidney disease, unspecified: Secondary | ICD-10-CM | POA: Diagnosis not present

## 2019-08-29 DIAGNOSIS — I129 Hypertensive chronic kidney disease with stage 1 through stage 4 chronic kidney disease, or unspecified chronic kidney disease: Secondary | ICD-10-CM | POA: Diagnosis not present

## 2019-08-29 DIAGNOSIS — E1122 Type 2 diabetes mellitus with diabetic chronic kidney disease: Secondary | ICD-10-CM | POA: Diagnosis not present

## 2019-09-07 ENCOUNTER — Other Ambulatory Visit: Payer: Self-pay

## 2019-09-07 ENCOUNTER — Encounter: Payer: Self-pay | Admitting: Family Medicine

## 2019-09-07 ENCOUNTER — Ambulatory Visit (INDEPENDENT_AMBULATORY_CARE_PROVIDER_SITE_OTHER): Payer: Medicare HMO | Admitting: Family Medicine

## 2019-09-07 VITALS — BP 141/73 | HR 93 | Temp 98.0°F | Ht 65.0 in | Wt 224.0 lb

## 2019-09-07 DIAGNOSIS — E1159 Type 2 diabetes mellitus with other circulatory complications: Secondary | ICD-10-CM

## 2019-09-07 DIAGNOSIS — N1831 Chronic kidney disease, stage 3a: Secondary | ICD-10-CM

## 2019-09-07 DIAGNOSIS — I152 Hypertension secondary to endocrine disorders: Secondary | ICD-10-CM

## 2019-09-07 DIAGNOSIS — I10 Essential (primary) hypertension: Secondary | ICD-10-CM | POA: Diagnosis not present

## 2019-09-07 DIAGNOSIS — E1169 Type 2 diabetes mellitus with other specified complication: Secondary | ICD-10-CM | POA: Diagnosis not present

## 2019-09-07 DIAGNOSIS — E785 Hyperlipidemia, unspecified: Secondary | ICD-10-CM

## 2019-09-07 DIAGNOSIS — N183 Chronic kidney disease, stage 3 unspecified: Secondary | ICD-10-CM

## 2019-09-07 DIAGNOSIS — E1122 Type 2 diabetes mellitus with diabetic chronic kidney disease: Secondary | ICD-10-CM

## 2019-09-07 LAB — BAYER DCA HB A1C WAIVED: HB A1C (BAYER DCA - WAIVED): 6.9 % (ref <7.0)

## 2019-09-07 MED ORDER — ATORVASTATIN CALCIUM 40 MG PO TABS: 40.0000 mg | ORAL_TABLET | Freq: Every day | ORAL | 3 refills | Status: DC

## 2019-09-07 MED ORDER — SITAGLIPTIN PHOSPHATE 100 MG PO TABS
100.0000 mg | ORAL_TABLET | Freq: Every day | ORAL | 3 refills | Status: DC
Start: 2019-09-07 — End: 2020-09-25

## 2019-09-07 MED ORDER — ALLOPURINOL 100 MG PO TABS
50.0000 mg | ORAL_TABLET | Freq: Every day | ORAL | 3 refills | Status: DC
Start: 2019-09-07 — End: 2020-07-24

## 2019-09-07 MED ORDER — LISINOPRIL-HYDROCHLOROTHIAZIDE 20-12.5 MG PO TABS: 1.0000 | ORAL_TABLET | Freq: Every day | ORAL | 3 refills | Status: DC

## 2019-09-07 NOTE — Progress Notes (Signed)
BP (!) 141/73   Pulse 93   Temp 98 F (36.7 C)   Ht _0  (1.651 m)   Wt 224 lb (101.6 kg)   SpO2 100%   BMI 37.28 kg/m    Subjective:   Patient ID: Paul Williams, male    DOB: May 16, 1950, 69 y.o.   MRN: [id number]  HPI: Paul Williams is a 69 y.o. male presenting on 09/07/2019 for Medical Management of Chronic Issues and Diabetes   HPI Type 2 diabetes mellitus Patient comes in today for recheck of his diabetes. Patient has been currently taking Januvia. Patient is currently on an ACE inhibitor/ARB. Patient has not seen an ophthalmologist this year. Patient denies any issues with their feet. The symptom started onset as an adult hypertension and hyperlipidemia and CKD stage III ARE RELATED TO DM   Hypertension Patient is currently on lisinopril hydrochlorothiazide, and their blood pressure today is 141/73. Patient denies any lightheadedness or dizziness. Patient denies headaches, blurred vision, chest pains, shortness of breath, or weakness. Denies any side effects from medication and is content with current medication.   Hyperlipidemia Patient is coming in for recheck of his hyperlipidemia. The patient is currently taking fish oil and Lipitor. They deny any issues with myalgias or history of liver damage from it. They deny any focal numbness or weakness or chest pain.   Relevant past medical, surgical, family and social history reviewed and updated as indicated. Interim medical history since our last visit reviewed. Allergies and medications reviewed and updated.  Review of Systems  Constitutional: Negative for chills and fever.  Eyes: Negative for visual disturbance.  Respiratory: Negative for shortness of breath and wheezing.   Cardiovascular: Negative for chest pain and leg swelling.  Musculoskeletal: Negative for back pain and gait problem.  Skin: Negative for rash.  Neurological: Negative for dizziness, weakness and light-headedness.  All other systems reviewed and are  negative.   Per HPI unless specifically indicated above   Allergies as of 09/07/2019   No Known Allergies     Medication List       Accurate as of September 07, 2019  9:56 AM. If you have any questions, ask your nurse or doctor.        allopurinol 100 MG tablet Commonly known as: ZYLOPRIM Take 0.5 tablets (50 mg total) by mouth daily.   aspirin EC 81 MG tablet Take 81 mg by mouth daily.   atorvastatin 40 MG tablet Commonly known as: LIPITOR Take 1 tablet (40 mg total) by mouth daily. What changed: See the new instructions. Changed by: Fransisca Kaufmann Tymesha Ditmore, MD   colchicine 0.6 MG tablet 2 tablets at pain onset, may repeat x1in 1 hour. No more then 3 tablets in 24 hours   fish oil-omega-3 fatty acids 1000 MG capsule Take 1 g by mouth daily.   lisinopril-hydrochlorothiazide 20-12.5 MG tablet Commonly known as: ZESTORETIC Take 1 tablet by mouth daily.   multivitamin tablet Take 1 tablet by mouth daily.   OVER THE COUNTER MEDICATION Take 250 mg by mouth 2 (two) times daily. Super beta prostate   sildenafil 100 MG tablet Commonly known as: VIAGRA Take 0.5 tablets (50 mg total) by mouth as needed for erectile dysfunction.   sitaGLIPtin 100 MG tablet Commonly known as: Januvia Take 1 tablet (100 mg total) by mouth daily.   Vitamin D3 125 MCG (5000 UT) Caps Take 1 capsule by mouth daily.        Objective:   BP (!) 141/73  Pulse 93   Temp 98 F (36.7 C)   Ht _0  (1.651 m)   Wt 224 lb (101.6 kg)   SpO2 100%   BMI 37.28 kg/m   Wt Readings from Last 3 Encounters:  09/07/19 224 lb (101.6 kg)  06/07/19 225 lb (102.1 kg)  03/08/19 224 lb 8 oz (101.8 kg)    Physical Exam Vitals and nursing note reviewed.  Constitutional:      General: He is not in acute distress.    Appearance: He is well-developed. He is not diaphoretic.  Eyes:     General: No scleral icterus.    Conjunctiva/sclera: Conjunctivae normal.  Neck:     Thyroid: No thyromegaly.   Cardiovascular:     Rate and Rhythm: Normal rate and regular rhythm.     Heart sounds: Normal heart sounds. No murmur heard.   Pulmonary:     Effort: Pulmonary effort is normal. No respiratory distress.     Breath sounds: Normal breath sounds. No wheezing.  Musculoskeletal:        General: No swelling. Normal range of motion.     Cervical back: Neck supple.  Lymphadenopathy:     Cervical: No cervical adenopathy.  Skin:    General: Skin is warm and dry.     Findings: No rash.  Neurological:     Mental Status: He is alert and oriented to person, place, and time.     Coordination: Coordination normal.  Psychiatric:        Behavior: Behavior normal.       Assessment & Plan:   Problem List Items Addressed This Visit      Cardiovascular and Mediastinum   Hypertension associated with diabetes ([place])   Relevant Medications   lisinopril-hydrochlorothiazide (ZESTORETIC) 20-12.5 MG tablet   sitaGLIPtin (JANUVIA) 100 MG tablet   atorvastatin (LIPITOR) 40 MG tablet   Other Relevant Orders   CMP14+EGFR     Endocrine   Hyperlipidemia associated with type 2 diabetes mellitus ([facility])   Relevant Medications   lisinopril-hydrochlorothiazide (ZESTORETIC) 20-12.5 MG tablet   sitaGLIPtin (JANUVIA) 100 MG tablet   atorvastatin (LIPITOR) 40 MG tablet   Other Relevant Orders   Lipid panel   Type 2 diabetes mellitus with diabetic chronic kidney disease ([facility]) - Primary   Relevant Medications   lisinopril-hydrochlorothiazide (ZESTORETIC) 20-12.5 MG tablet   sitaGLIPtin (JANUVIA) 100 MG tablet   atorvastatin (LIPITOR) 40 MG tablet   Other Relevant Orders   Bayer DCA Hb A1c Waived   CBC with Differential/Platelet   CMP14+EGFR     Genitourinary   Chronic kidney disease (CKD), stage III (moderate)   Relevant Orders   CMP14+EGFR    Patient seems to be doing well, A1c is 6.9, we will not change any medicine, continue follow-up with nephrology.  Follow up plan: Return in about 3 months  (around 12/07/2019), or if symptoms worsen or fail to improve, for Diabetes and hypertension..  Counseling provided for all of the vaccine components Orders Placed This Encounter  Procedures  . Bayer DCA Hb A1c Waived  . CBC with Differential/Platelet  . CMP14+EGFR  . Lipid panel    Caryl Pina, MD [facility] 09/07/2019, 9:56 AM

## 2019-09-08 LAB — CMP14+EGFR
ALT: 25 IU/L (ref 0–44)
AST: 25 IU/L (ref 0–40)
Albumin/Globulin Ratio: 1.7 (ref 1.2–2.2)
Albumin: 4.3 g/dL (ref 3.8–4.8)
Alkaline Phosphatase: 84 IU/L (ref 48–121)
BUN/Creatinine Ratio: 10 (ref 10–24)
BUN: 18 mg/dL (ref 8–27)
Bilirubin Total: 0.7 mg/dL (ref 0.0–1.2)
CO2: 22 mmol/L (ref 20–29)
Calcium: 9.8 mg/dL (ref 8.6–10.2)
Chloride: 104 mmol/L (ref 96–106)
Creatinine, Ser: 1.78 mg/dL — ABNORMAL HIGH (ref 0.76–1.27)
GFR calc Af Amer: 45 mL/min/1.73 — ABNORMAL LOW
GFR calc non Af Amer: 39 mL/min/1.73 — ABNORMAL LOW
Globulin, Total: 2.6 g/dL (ref 1.5–4.5)
Glucose: 140 mg/dL — ABNORMAL HIGH (ref 65–99)
Potassium: 4.2 mmol/L (ref 3.5–5.2)
Sodium: 140 mmol/L (ref 134–144)
Total Protein: 6.9 g/dL (ref 6.0–8.5)

## 2019-09-08 LAB — CBC WITH DIFFERENTIAL/PLATELET
Basophils Absolute: 0 x10E3/uL (ref 0.0–0.2)
Basos: 0 %
EOS (ABSOLUTE): 0.2 x10E3/uL (ref 0.0–0.4)
Eos: 2 %
Hematocrit: 43.4 % (ref 37.5–51.0)
Hemoglobin: 14.7 g/dL (ref 13.0–17.7)
Immature Grans (Abs): 0 x10E3/uL (ref 0.0–0.1)
Immature Granulocytes: 0 %
Lymphocytes Absolute: 2.8 x10E3/uL (ref 0.7–3.1)
Lymphs: 31 %
MCH: 30.4 pg (ref 26.6–33.0)
MCHC: 33.9 g/dL (ref 31.5–35.7)
MCV: 90 fL (ref 79–97)
Monocytes Absolute: 0.8 x10E3/uL (ref 0.1–0.9)
Monocytes: 8 %
Neutrophils Absolute: 5.2 x10E3/uL (ref 1.4–7.0)
Neutrophils: 59 %
Platelets: 264 x10E3/uL (ref 150–450)
RBC: 4.83 x10E6/uL (ref 4.14–5.80)
RDW: 13.3 % (ref 11.6–15.4)
WBC: 9 x10E3/uL (ref 3.4–10.8)

## 2019-09-08 LAB — LIPID PANEL
Chol/HDL Ratio: 2 ratio (ref 0.0–5.0)
Cholesterol, Total: 101 mg/dL (ref 100–199)
HDL: 50 mg/dL (ref >39)
LDL Chol Calc (NIH): 38 mg/dL (ref 0–99)
Triglycerides: 52 mg/dL (ref 0–149)
VLDL Cholesterol Cal: 13 mg/dL (ref 5–40)

## 2019-11-23 DIAGNOSIS — N189 Chronic kidney disease, unspecified: Secondary | ICD-10-CM | POA: Diagnosis not present

## 2019-11-23 DIAGNOSIS — E6609 Other obesity due to excess calories: Secondary | ICD-10-CM | POA: Diagnosis not present

## 2019-11-23 DIAGNOSIS — E1122 Type 2 diabetes mellitus with diabetic chronic kidney disease: Secondary | ICD-10-CM | POA: Diagnosis not present

## 2019-11-23 DIAGNOSIS — I129 Hypertensive chronic kidney disease with stage 1 through stage 4 chronic kidney disease, or unspecified chronic kidney disease: Secondary | ICD-10-CM | POA: Diagnosis not present

## 2019-11-27 ENCOUNTER — Encounter: Payer: Self-pay | Admitting: Nurse Practitioner

## 2019-11-27 ENCOUNTER — Ambulatory Visit (INDEPENDENT_AMBULATORY_CARE_PROVIDER_SITE_OTHER): Payer: Medicare HMO | Admitting: Nurse Practitioner

## 2019-11-27 DIAGNOSIS — M791 Myalgia, unspecified site: Secondary | ICD-10-CM

## 2019-11-27 DIAGNOSIS — J988 Other specified respiratory disorders: Secondary | ICD-10-CM | POA: Diagnosis not present

## 2019-11-27 DIAGNOSIS — R059 Cough, unspecified: Secondary | ICD-10-CM

## 2019-11-27 NOTE — Progress Notes (Signed)
   Virtual Visit via telephone Note Due to COVID-19 pandemic this visit was conducted virtually. This visit type was conducted due to national recommendations for restrictions regarding the COVID-19 Pandemic (e.g. social distancing, sheltering in place) in an effort to limit this patient's exposure and mitigate transmission in our community. All issues noted in this document were discussed and addressed.  A physical exam was not performed with this format.  I connected with Paul Williams on 11/27/19 at 10:50 by telephone and verified that I am speaking with the correct person using two identifiers. Paul Williams is currently located at home and no one is currently with him during visit. The provider, Mary-Margaret Hassell Done, FNP is located in their office at time of visit.  I discussed the limitations, risks, security and privacy concerns of performing an evaluation and management service by telephone and the availability of in person appointments. I also discussed with the patient that there may be a patient responsible charge related to this service. The patient expressed understanding and agreed to proceed.   History and Present Illness:   Chief Complaint: URI   HPI Patient calls in for telephone visit c/o chills, sweating, body aches and congestion. Has been taking tylenol and cloracidin. Fatigue, does not feel like doing anything. Started 1 week ago. He has not had covid vaccine.    Review of Systems  Constitutional: Positive for chills, diaphoresis, fever and malaise/fatigue.  HENT: Positive for congestion. Negative for sore throat.   Respiratory: Positive for cough and sputum production. Negative for shortness of breath.   Cardiovascular: Negative for chest pain and palpitations.  Musculoskeletal: Positive for myalgias.  Neurological: Positive for headaches.  Psychiatric/Behavioral: Negative.   All other systems reviewed and are negative.    Observations/Objective: Alert and oriented-  answers all questions appropriately No distress Voice hoarse  Assessment and Plan: [name] in today with chief complaint of URI   1. Cough - Novel Coronavirus, NAA ([facility])  2. Congestion of upper airway - Novel Coronavirus, NAA ([facility])  3. Myalgia - Novel Coronavirus, NAA ([facility]) forse fluids  Rest Continue OTC decongestant Tylenol OTC To ER if worsens    Follow Up Instructions: prn    I discussed the assessment and treatment plan with the patient. The patient was provided an opportunity to ask questions and all were answered. The patient agreed with the plan and demonstrated an understanding of the instructions.   The patient was advised to call back or seek an in-person evaluation if the symptoms worsen or if the condition fails to improve as anticipated.  The above assessment and management plan was discussed with the patient. The patient verbalized understanding of and has agreed to the management plan. Patient is aware to call the clinic if symptoms persist or worsen. Patient is aware when to return to the clinic for a follow-up visit. Patient educated on when it is appropriate to go to the emergency department.   Time call ended:  11:10 I provided 20 minutes of non-face-to-face time during this encounter.    Mary-Margaret Hassell Done, FNP

## 2019-11-28 ENCOUNTER — Telehealth: Payer: Self-pay

## 2019-11-28 NOTE — Telephone Encounter (Signed)
Patient aware results are not back

## 2019-11-29 LAB — SARS-COV-2, NAA 2 DAY TAT

## 2019-11-29 LAB — NOVEL CORONAVIRUS, NAA: SARS-CoV-2, NAA: DETECTED — AB

## 2019-11-30 ENCOUNTER — Telehealth: Payer: Self-pay | Admitting: Nurse Practitioner

## 2019-11-30 NOTE — Telephone Encounter (Signed)
Called to discuss with [facility] about Covid symptoms and the use of a monoclonal antibody infusion for those with mild to moderate Covid symptoms and at a high risk of hospitalization.     Pt does not qualify for infusion therapy as his symptoms first presented > 10 days prior to timing of infusion. Reports symptoms started around 11/11 or 11/16/19. Also states symptoms have pretty much resolved.  Symptoms tier reviewed as well as criteria for ending isolation. Preventative practices reviewed. Patient verbalized understanding    Patient Active Problem List   Diagnosis Date Noted  . Chronic kidney disease (CKD), stage III (moderate) ([place]) 02/16/2018  . Morbid obesity ([place]) 08/12/2017  . Hyperlipidemia associated with type 2 diabetes mellitus ([place])   . Hypertension associated with diabetes ([place])   . Type 2 diabetes mellitus with diabetic chronic kidney disease ([place])     Alda Lea, AGPCNP-BC

## 2019-12-12 ENCOUNTER — Ambulatory Visit (INDEPENDENT_AMBULATORY_CARE_PROVIDER_SITE_OTHER): Payer: Medicare HMO | Admitting: Family Medicine

## 2019-12-12 ENCOUNTER — Other Ambulatory Visit: Payer: Self-pay

## 2019-12-12 ENCOUNTER — Encounter: Payer: Self-pay | Admitting: Family Medicine

## 2019-12-12 VITALS — BP 144/82 | HR 120 | Ht 65.0 in | Wt 218.0 lb

## 2019-12-12 DIAGNOSIS — I152 Hypertension secondary to endocrine disorders: Secondary | ICD-10-CM | POA: Diagnosis not present

## 2019-12-12 DIAGNOSIS — E1169 Type 2 diabetes mellitus with other specified complication: Secondary | ICD-10-CM

## 2019-12-12 DIAGNOSIS — E785 Hyperlipidemia, unspecified: Secondary | ICD-10-CM | POA: Diagnosis not present

## 2019-12-12 DIAGNOSIS — N183 Chronic kidney disease, stage 3 unspecified: Secondary | ICD-10-CM

## 2019-12-12 DIAGNOSIS — E1122 Type 2 diabetes mellitus with diabetic chronic kidney disease: Secondary | ICD-10-CM

## 2019-12-12 DIAGNOSIS — N1831 Chronic kidney disease, stage 3a: Secondary | ICD-10-CM | POA: Diagnosis not present

## 2019-12-12 DIAGNOSIS — E1159 Type 2 diabetes mellitus with other circulatory complications: Secondary | ICD-10-CM | POA: Diagnosis not present

## 2019-12-12 LAB — BAYER DCA HB A1C WAIVED: HB A1C (BAYER DCA - WAIVED): 7.8 % — ABNORMAL HIGH (ref <7.0)

## 2019-12-12 NOTE — Progress Notes (Signed)
BP (!) 144/82   Pulse (!) 120   Ht 5\' 5"  (1.651 m)   Wt 218 lb (98.9 kg)   SpO2 (!) 88%   BMI 36.28 kg/m    Subjective:   Patient ID: Paul Williams, male    DOB: 08/11/1950, 69 y.o.   MRN: [id number]  HPI: Paul Williams is a 69 y.o. male presenting on 12/12/2019 for Medical Management of Chronic Issues and Diabetes   HPI Type 2 diabetes mellitus Patient comes in today for recheck of his diabetes. Patient has been currently taking Januvia. Patient is currently on an ACE inhibitor/ARB. Patient has seen an ophthalmologist this year. Patient denies any issues with their feet. The symptom started onset as an adult hypertension and hyperlipidemia and CKD stage III ARE RELATED TO DM   Hypertension Patient is currently on lisinopril hydrochlorothiazide, and their blood pressure today is 144/82. Patient denies any lightheadedness or dizziness. Patient denies headaches, blurred vision, chest pains, shortness of breath, or weakness. Denies any side effects from medication and is content with current medication.   Hyperlipidemia Patient is coming in for recheck of his hyperlipidemia. The patient is currently taking fish oil and atorvastatin. They deny any issues with myalgias or history of liver damage from it. They deny any focal numbness or weakness or chest pain.   CKD stage III [place] is coming for recheck for CKD stage III, does see nephrology.  Patient was diagnosed with Covid on 11/26/2019, he says is feeling a lot better. Patient is feeling a lot better but still some fatigue.  He denies chest pain or shortness of breath.   Relevant past medical, surgical, family and social history reviewed and updated as indicated. Interim medical history since our last visit reviewed. Allergies and medications reviewed and updated.  Review of Systems  Constitutional: Negative for chills and fever.  Eyes: Negative for visual disturbance.  Respiratory: Negative for shortness of breath and wheezing.    Cardiovascular: Negative for chest pain and leg swelling.  Musculoskeletal: Negative for back pain and gait problem.  Skin: Negative for rash.  Neurological: Negative for dizziness, weakness and light-headedness.  All other systems reviewed and are negative.   Per HPI unless specifically indicated above   Allergies as of 12/12/2019   No Known Allergies     Medication List       Accurate as of December 12, 2019  9:34 AM. If you have any questions, ask your nurse or doctor.        allopurinol 100 MG tablet Commonly known as: ZYLOPRIM Take 0.5 tablets (50 mg total) by mouth daily.   aspirin EC 81 MG tablet Take 81 mg by mouth daily.   atorvastatin 40 MG tablet Commonly known as: LIPITOR Take 1 tablet (40 mg total) by mouth daily.   colchicine 0.6 MG tablet 2 tablets at pain onset, may repeat x1in 1 hour. No more then 3 tablets in 24 hours   fish oil-omega-3 fatty acids 1000 MG capsule Take 1 g by mouth daily.   lisinopril-hydrochlorothiazide 20-12.5 MG tablet Commonly known as: ZESTORETIC Take 1 tablet by mouth daily.   multivitamin tablet Take 1 tablet by mouth daily.   OVER THE COUNTER MEDICATION Take 250 mg by mouth 2 (two) times daily. Super beta prostate   sildenafil 100 MG tablet Commonly known as: VIAGRA Take 0.5 tablets (50 mg total) by mouth as needed for erectile dysfunction.   sitaGLIPtin 100 MG tablet Commonly known as: Januvia Take 1 tablet (100 mg  total) by mouth daily.   Vitamin D3 125 MCG (5000 UT) Caps Take 1 capsule by mouth daily.        Objective:   BP (!) 144/82   Pulse (!) 120   Ht 5\' 5"  (1.651 m)   Wt 218 lb (98.9 kg)   SpO2 (!) 88%   BMI 36.28 kg/m   Wt Readings from Last 3 Encounters:  12/12/19 218 lb (98.9 kg)  09/07/19 224 lb (101.6 kg)  06/07/19 225 lb (102.1 kg)    Physical Exam Vitals and nursing note reviewed.  Constitutional:      General: He is not in acute distress.    Appearance: He is well-developed. He  is not diaphoretic.  Eyes:     General: No scleral icterus.    Conjunctiva/sclera: Conjunctivae normal.  Neck:     Thyroid: No thyromegaly.  Cardiovascular:     Rate and Rhythm: Regular rhythm. Tachycardia present.     Heart sounds: Normal heart sounds. No murmur heard.   Pulmonary:     Effort: Pulmonary effort is normal. No respiratory distress.     Breath sounds: Normal breath sounds. No wheezing.  Musculoskeletal:        General: Normal range of motion.     Cervical back: Neck supple.  Lymphadenopathy:     Cervical: No cervical adenopathy.  Skin:    General: Skin is warm and dry.     Findings: No rash.  Neurological:     Mental Status: He is alert and oriented to person, place, and time.     Coordination: Coordination normal.  Psychiatric:        Behavior: Behavior normal.    Assessment & Plan:   Problem List Items Addressed This Visit      Cardiovascular and Mediastinum   Hypertension associated with diabetes ([place])     Endocrine   Hyperlipidemia associated with type 2 diabetes mellitus ([place])   Type 2 diabetes mellitus with diabetic chronic kidney disease ([place]) - Primary   Relevant Orders   Bayer DCA Hb A1c Waived     Genitourinary   Chronic kidney disease (CKD), stage III (moderate) (HCC)    His oxygen on repeat went up to 92% and his heart rate was down in the 112 the 115 range.  He is still just recently recovering from Covid and I believe that is why he is still having some inflammation, he says he is feeling a lot better and denies any major cough or shortness of breath.  We will continue continue to monitor at next visit.  Follow up plan: Return in about 3 months (around 03/11/2020), or if symptoms worsen or fail to improve, for Diabetes and hypertension and cholesterol.  Counseling provided for all of the vaccine components Orders Placed This Encounter  Procedures  . Bayer [facility] Hb A1c [place], MD [place]  [facility] 12/12/2019, 9:34 AM

## 2019-12-20 DIAGNOSIS — H52203 Unspecified astigmatism, bilateral: Secondary | ICD-10-CM | POA: Diagnosis not present

## 2019-12-20 DIAGNOSIS — H524 Presbyopia: Secondary | ICD-10-CM | POA: Diagnosis not present

## 2019-12-20 DIAGNOSIS — H2513 Age-related nuclear cataract, bilateral: Secondary | ICD-10-CM | POA: Diagnosis not present

## 2019-12-20 DIAGNOSIS — E1136 Type 2 diabetes mellitus with diabetic cataract: Secondary | ICD-10-CM | POA: Diagnosis not present

## 2020-01-03 DIAGNOSIS — E1122 Type 2 diabetes mellitus with diabetic chronic kidney disease: Secondary | ICD-10-CM | POA: Diagnosis not present

## 2020-01-03 DIAGNOSIS — I129 Hypertensive chronic kidney disease with stage 1 through stage 4 chronic kidney disease, or unspecified chronic kidney disease: Secondary | ICD-10-CM | POA: Diagnosis not present

## 2020-01-03 DIAGNOSIS — N189 Chronic kidney disease, unspecified: Secondary | ICD-10-CM | POA: Diagnosis not present

## 2020-01-03 DIAGNOSIS — U071 COVID-19: Secondary | ICD-10-CM | POA: Diagnosis not present

## 2020-01-08 DIAGNOSIS — E6609 Other obesity due to excess calories: Secondary | ICD-10-CM | POA: Diagnosis not present

## 2020-01-08 DIAGNOSIS — I129 Hypertensive chronic kidney disease with stage 1 through stage 4 chronic kidney disease, or unspecified chronic kidney disease: Secondary | ICD-10-CM | POA: Diagnosis not present

## 2020-01-08 DIAGNOSIS — R809 Proteinuria, unspecified: Secondary | ICD-10-CM | POA: Diagnosis not present

## 2020-01-08 DIAGNOSIS — N189 Chronic kidney disease, unspecified: Secondary | ICD-10-CM | POA: Diagnosis not present

## 2020-01-08 DIAGNOSIS — E1129 Type 2 diabetes mellitus with other diabetic kidney complication: Secondary | ICD-10-CM | POA: Diagnosis not present

## 2020-01-08 DIAGNOSIS — E1122 Type 2 diabetes mellitus with diabetic chronic kidney disease: Secondary | ICD-10-CM | POA: Diagnosis not present

## 2020-01-15 DIAGNOSIS — H5213 Myopia, bilateral: Secondary | ICD-10-CM | POA: Diagnosis not present

## 2020-01-15 DIAGNOSIS — H524 Presbyopia: Secondary | ICD-10-CM | POA: Diagnosis not present

## 2020-01-15 DIAGNOSIS — H52209 Unspecified astigmatism, unspecified eye: Secondary | ICD-10-CM | POA: Diagnosis not present

## 2020-03-14 ENCOUNTER — Other Ambulatory Visit: Payer: Self-pay

## 2020-03-14 ENCOUNTER — Encounter: Payer: Self-pay | Admitting: Family Medicine

## 2020-03-14 ENCOUNTER — Ambulatory Visit (INDEPENDENT_AMBULATORY_CARE_PROVIDER_SITE_OTHER): Payer: Medicare HMO | Admitting: Family Medicine

## 2020-03-14 VITALS — BP 136/73 | HR 90 | Ht 65.0 in | Wt 230.0 lb

## 2020-03-14 DIAGNOSIS — N183 Chronic kidney disease, stage 3 unspecified: Secondary | ICD-10-CM

## 2020-03-14 DIAGNOSIS — I152 Hypertension secondary to endocrine disorders: Secondary | ICD-10-CM | POA: Diagnosis not present

## 2020-03-14 DIAGNOSIS — E1159 Type 2 diabetes mellitus with other circulatory complications: Secondary | ICD-10-CM

## 2020-03-14 DIAGNOSIS — N1831 Chronic kidney disease, stage 3a: Secondary | ICD-10-CM | POA: Diagnosis not present

## 2020-03-14 DIAGNOSIS — E1122 Type 2 diabetes mellitus with diabetic chronic kidney disease: Secondary | ICD-10-CM | POA: Diagnosis not present

## 2020-03-14 DIAGNOSIS — E785 Hyperlipidemia, unspecified: Secondary | ICD-10-CM

## 2020-03-14 DIAGNOSIS — E1169 Type 2 diabetes mellitus with other specified complication: Secondary | ICD-10-CM

## 2020-03-14 LAB — BAYER DCA HB A1C WAIVED: HB A1C (BAYER DCA - WAIVED): 6.9 % (ref <7.0)

## 2020-03-14 NOTE — Progress Notes (Signed)
 BP 136/73   Pulse 90   Ht 5' 5" (1.651 m)   Wt 230 lb (104.3 kg)   SpO2 94%   BMI 38.27 kg/m    Subjective:   Patient ID: Paul Williams, male    DOB: 02/08/1950, 70 y.o.   MRN: [id number]  HPI: Nickalas Magar is a 70 y.o. male presenting on 03/14/2020 for Medical Management of Chronic Issues and Diabetes   HPI Type 2 diabetes mellitus Patient comes in today for recheck of his diabetes. Patient has been currently taking Januvia and A1c is 6.9 today. Patient is currently on an ACE inhibitor/ARB. Patient has not seen an ophthalmologist this year. Patient denies any issues with their feet. The symptom started onset as an adult CKD stage III and hypertension and hyperlipidemia ARE RELATED TO DM.  Patient sees nephrologist for CKD  Hypertension Patient is currently on lisinopril hydrochlorothiazide, and their blood pressure today is 145/86 and 136/76. Patient denies any lightheadedness or dizziness. Patient denies headaches, blurred vision, chest pains, shortness of breath, or weakness. Denies any side effects from medication and is content with current medication.   Hyperlipidemia Patient is coming in for recheck of his hyperlipidemia. The patient is currently taking fish oil and Lipitor. They deny any issues with myalgias or history of liver damage from it. They deny any focal numbness or weakness or chest pain.   Relevant past medical, surgical, family and social history reviewed and updated as indicated. Interim medical history since our last visit reviewed. Allergies and medications reviewed and updated.  Review of Systems  Constitutional: Negative for chills and fever.  Eyes: Negative for visual disturbance.  Respiratory: Negative for shortness of breath and wheezing.   Cardiovascular: Negative for chest pain and leg swelling.  Musculoskeletal: Negative for back pain and gait problem.  Skin: Negative for rash.  Neurological: Negative for dizziness, weakness and numbness.  All other  systems reviewed and are negative.   Per HPI unless specifically indicated above   Allergies as of 03/14/2020   No Known Allergies     Medication List       Accurate as of March 14, 2020 11:23 AM. If you have any questions, ask your nurse or doctor.        allopurinol 100 MG tablet Commonly known as: ZYLOPRIM Take 0.5 tablets (50 mg total) by mouth daily.   aspirin EC 81 MG tablet Take 81 mg by mouth daily.   atorvastatin 40 MG tablet Commonly known as: LIPITOR Take 1 tablet (40 mg total) by mouth daily.   colchicine 0.6 MG tablet 2 tablets at pain onset, may repeat x1in 1 hour. No more then 3 tablets in 24 hours   fish oil-omega-3 fatty acids 1000 MG capsule Take 1 g by mouth daily.   lisinopril-hydrochlorothiazide 20-12.5 MG tablet Commonly known as: ZESTORETIC Take 1 tablet by mouth daily.   multivitamin tablet Take 1 tablet by mouth daily.   OVER THE COUNTER MEDICATION Take 250 mg by mouth 2 (two) times daily. Super beta prostate   sildenafil 100 MG tablet Commonly known as: VIAGRA Take 0.5 tablets (50 mg total) by mouth as needed for erectile dysfunction.   sitaGLIPtin 100 MG tablet Commonly known as: Januvia Take 1 tablet (100 mg total) by mouth daily.   Vitamin D3 125 MCG (5000 UT) Caps Take 1 capsule by mouth daily.        Objective:   BP 136/73   Pulse 90   Ht 5' 5" (1.651 m)     Wt 230 lb (104.3 kg)   SpO2 94%   BMI 38.27 kg/m   Wt Readings from Last 3 Encounters:  03/14/20 230 lb (104.3 kg)  12/12/19 218 lb (98.9 kg)  09/07/19 224 lb (101.6 kg)    Physical Exam Vitals and nursing note reviewed.  Constitutional:      General: He is not in acute distress.    Appearance: He is well-developed. He is not diaphoretic.  Eyes:     General: No scleral icterus.    Conjunctiva/sclera: Conjunctivae normal.  Neck:     Thyroid: No thyromegaly.  Cardiovascular:     Rate and Rhythm: Normal rate and regular rhythm.     Heart sounds: Normal  heart sounds. No murmur heard.   Pulmonary:     Effort: Pulmonary effort is normal. No respiratory distress.     Breath sounds: Normal breath sounds. No wheezing.  Musculoskeletal:     Cervical back: Neck supple.  Lymphadenopathy:     Cervical: No cervical adenopathy.  Skin:    General: Skin is warm and dry.     Findings: No rash.  Neurological:     Mental Status: He is alert and oriented to person, place, and time.     Coordination: Coordination normal.  Psychiatric:        Behavior: Behavior normal.     A1c is 6.9  Assessment & Plan:   Problem List Items Addressed This Visit      Cardiovascular and Mediastinum   Hypertension associated with diabetes ([place])   Relevant Orders   CBC with Differential/Platelet   CMP14+EGFR   Lipid panel   Bayer DCA Hb A1c Waived     Endocrine   Hyperlipidemia associated with type 2 diabetes mellitus ([place])   Relevant Orders   CBC with Differential/Platelet   CMP14+EGFR   Lipid panel   Bayer DCA Hb A1c Waived   Type 2 diabetes mellitus with diabetic chronic kidney disease ([place]) - Primary   Relevant Orders   CBC with Differential/Platelet   CMP14+EGFR   Lipid panel   Bayer DCA Hb A1c Waived     Genitourinary   Chronic kidney disease (CKD), stage III (moderate) (HCC)      A1c is much improved and doing well, blood pressure is doing well as well in the 130s over 70s.  Continue current medication, no changes, see back in 3 months.  Continue following up with nephrology Follow up plan: Return in about 3 months (around 06/14/2020), or if symptoms worsen or fail to improve, for Diabetes and hypertension and cholesterol.  Counseling provided for all of the vaccine components Orders Placed This Encounter  Procedures  . CBC with Differential/Platelet  . CMP14+EGFR  . Lipid panel  . Bayer [facility] Hb A1c [place], MD [facility] 03/14/2020, 11:23 AM

## 2020-03-15 LAB — CBC WITH DIFFERENTIAL/PLATELET
Basophils Absolute: 0 10*3/uL (ref 0.0–0.2)
Basos: 0 %
EOS (ABSOLUTE): 0.2 10*3/uL (ref 0.0–0.4)
Eos: 3 %
Hematocrit: 45.1 % (ref 37.5–51.0)
Hemoglobin: 14.9 g/dL (ref 13.0–17.7)
Immature Grans (Abs): 0 10*3/uL (ref 0.0–0.1)
Immature Granulocytes: 0 %
Lymphocytes Absolute: 2.6 10*3/uL (ref 0.7–3.1)
Lymphs: 37 %
MCH: 28.9 pg (ref 26.6–33.0)
MCHC: 33 g/dL (ref 31.5–35.7)
MCV: 88 fL (ref 79–97)
Monocytes Absolute: 0.6 10*3/uL (ref 0.1–0.9)
Monocytes: 9 %
Neutrophils Absolute: 3.5 10*3/uL (ref 1.4–7.0)
Neutrophils: 51 %
Platelets: 248 10*3/uL (ref 150–450)
RBC: 5.15 x10E6/uL (ref 4.14–5.80)
RDW: 13.4 % (ref 11.6–15.4)
WBC: 7.1 10*3/uL (ref 3.4–10.8)

## 2020-03-15 LAB — CMP14+EGFR
ALT: 26 IU/L (ref 0–44)
AST: 35 IU/L (ref 0–40)
Albumin/Globulin Ratio: 1.2 (ref 1.2–2.2)
Albumin: 3.8 g/dL (ref 3.8–4.8)
Alkaline Phosphatase: 83 IU/L (ref 44–121)
BUN/Creatinine Ratio: 8 — ABNORMAL LOW (ref 10–24)
BUN: 11 mg/dL (ref 8–27)
Bilirubin Total: 0.6 mg/dL (ref 0.0–1.2)
CO2: 23 mmol/L (ref 20–29)
Calcium: 9.5 mg/dL (ref 8.6–10.2)
Chloride: 102 mmol/L (ref 96–106)
Creatinine, Ser: 1.43 mg/dL — ABNORMAL HIGH (ref 0.76–1.27)
Globulin, Total: 3.1 g/dL (ref 1.5–4.5)
Glucose: 158 mg/dL — ABNORMAL HIGH (ref 65–99)
Potassium: 4.1 mmol/L (ref 3.5–5.2)
Sodium: 139 mmol/L (ref 134–144)
Total Protein: 6.9 g/dL (ref 6.0–8.5)
eGFR: 54 mL/min/{1.73_m2} — ABNORMAL LOW (ref >59)

## 2020-03-15 LAB — LIPID PANEL
Chol/HDL Ratio: 2.2 ratio (ref 0.0–5.0)
Cholesterol, Total: 93 mg/dL — ABNORMAL LOW (ref 100–199)
HDL: 42 mg/dL (ref >39)
LDL Chol Calc (NIH): 37 mg/dL (ref 0–99)
Triglycerides: 58 mg/dL (ref 0–149)
VLDL Cholesterol Cal: 14 mg/dL (ref 5–40)

## 2020-03-19 ENCOUNTER — Ambulatory Visit: Payer: Medicare HMO | Admitting: Family Medicine

## 2020-03-20 DIAGNOSIS — Z23 Encounter for immunization: Secondary | ICD-10-CM | POA: Diagnosis not present

## 2020-03-31 DIAGNOSIS — I129 Hypertensive chronic kidney disease with stage 1 through stage 4 chronic kidney disease, or unspecified chronic kidney disease: Secondary | ICD-10-CM | POA: Diagnosis not present

## 2020-03-31 DIAGNOSIS — E6609 Other obesity due to excess calories: Secondary | ICD-10-CM | POA: Diagnosis not present

## 2020-03-31 DIAGNOSIS — N189 Chronic kidney disease, unspecified: Secondary | ICD-10-CM | POA: Diagnosis not present

## 2020-03-31 DIAGNOSIS — R809 Proteinuria, unspecified: Secondary | ICD-10-CM | POA: Diagnosis not present

## 2020-03-31 DIAGNOSIS — E1122 Type 2 diabetes mellitus with diabetic chronic kidney disease: Secondary | ICD-10-CM | POA: Diagnosis not present

## 2020-03-31 DIAGNOSIS — E1129 Type 2 diabetes mellitus with other diabetic kidney complication: Secondary | ICD-10-CM | POA: Diagnosis not present

## 2020-04-09 DIAGNOSIS — N189 Chronic kidney disease, unspecified: Secondary | ICD-10-CM | POA: Diagnosis not present

## 2020-04-09 DIAGNOSIS — R809 Proteinuria, unspecified: Secondary | ICD-10-CM | POA: Diagnosis not present

## 2020-04-09 DIAGNOSIS — E6609 Other obesity due to excess calories: Secondary | ICD-10-CM | POA: Diagnosis not present

## 2020-04-09 DIAGNOSIS — E1129 Type 2 diabetes mellitus with other diabetic kidney complication: Secondary | ICD-10-CM | POA: Diagnosis not present

## 2020-04-09 DIAGNOSIS — I129 Hypertensive chronic kidney disease with stage 1 through stage 4 chronic kidney disease, or unspecified chronic kidney disease: Secondary | ICD-10-CM | POA: Diagnosis not present

## 2020-04-09 DIAGNOSIS — E1122 Type 2 diabetes mellitus with diabetic chronic kidney disease: Secondary | ICD-10-CM | POA: Diagnosis not present

## 2020-04-16 DIAGNOSIS — Z23 Encounter for immunization: Secondary | ICD-10-CM | POA: Diagnosis not present

## 2020-06-16 ENCOUNTER — Other Ambulatory Visit: Payer: Self-pay

## 2020-06-16 ENCOUNTER — Encounter: Payer: Self-pay | Admitting: Family Medicine

## 2020-06-16 ENCOUNTER — Ambulatory Visit (INDEPENDENT_AMBULATORY_CARE_PROVIDER_SITE_OTHER): Payer: Medicare HMO | Admitting: Family Medicine

## 2020-06-16 VITALS — BP 138/89 | HR 99 | Ht 65.0 in | Wt 233.0 lb

## 2020-06-16 DIAGNOSIS — E785 Hyperlipidemia, unspecified: Secondary | ICD-10-CM | POA: Diagnosis not present

## 2020-06-16 DIAGNOSIS — N1831 Chronic kidney disease, stage 3a: Secondary | ICD-10-CM | POA: Diagnosis not present

## 2020-06-16 DIAGNOSIS — E1159 Type 2 diabetes mellitus with other circulatory complications: Secondary | ICD-10-CM

## 2020-06-16 DIAGNOSIS — E1169 Type 2 diabetes mellitus with other specified complication: Secondary | ICD-10-CM

## 2020-06-16 DIAGNOSIS — N183 Chronic kidney disease, stage 3 unspecified: Secondary | ICD-10-CM

## 2020-06-16 DIAGNOSIS — I152 Hypertension secondary to endocrine disorders: Secondary | ICD-10-CM

## 2020-06-16 DIAGNOSIS — E1122 Type 2 diabetes mellitus with diabetic chronic kidney disease: Secondary | ICD-10-CM

## 2020-06-16 LAB — BAYER DCA HB A1C WAIVED: HB A1C (BAYER DCA - WAIVED): 7.4 % — ABNORMAL HIGH (ref <7.0)

## 2020-06-16 NOTE — Progress Notes (Signed)
BP 138/89   Pulse 99   Ht 5\' 5"  (1.651 m)   Wt 233 lb (105.7 kg)   SpO2 98%   BMI 38.77 kg/m    Subjective:   Patient ID: Paul Williams, male    DOB: November 05, 1950, 70 y.o.   MRN: [id number]  HPI: Paul Williams is a 70 y.o. male presenting on 06/16/2020 for Medical Management of Chronic Issues and Diabetes   HPI Type 2 diabetes mellitus Patient comes in today for recheck of his diabetes. Patient has been currently taking Januvia. Patient is currently on an ACE inhibitor/ARB. Patient has seen an ophthalmologist this year. Patient denies any issues with their feet. The symptom started onset as an adult hypertension and hyperlipidemia ARE RELATED TO DM   Hypertension Patient is currently on lisinopril hydrochlorothiazide, and their blood pressure today is 138/89. Patient denies any lightheadedness or dizziness. Patient denies headaches, blurred vision, chest pains, shortness of breath, or weakness. Denies any side effects from medication and is content with current medication.   Hyperlipidemia Patient is coming in for recheck of his hyperlipidemia. The patient is currently taking fish oil and atorvastatin. They deny any issues with myalgias or history of liver damage from it. They deny any focal numbness or weakness or chest pain.   Relevant past medical, surgical, family and social history reviewed and updated as indicated. Interim medical history since our last visit reviewed. Allergies and medications reviewed and updated.  Review of Systems  Constitutional:  Negative for chills and fever.  Eyes:  Negative for visual disturbance.  Respiratory:  Negative for shortness of breath and wheezing.   Cardiovascular:  Negative for chest pain and leg swelling.  Musculoskeletal:  Negative for back pain and gait problem.  Skin:  Negative for rash.  Neurological:  Negative for dizziness, weakness and light-headedness.  All other systems reviewed and are negative.  Per HPI unless specifically  indicated above   Allergies as of 06/16/2020   No Known Allergies      Medication List        Accurate as of June 16, 2020 10:08 AM. If you have any questions, ask your nurse or doctor.          allopurinol 100 MG tablet Commonly known as: ZYLOPRIM Take 0.5 tablets (50 mg total) by mouth daily.   aspirin EC 81 MG tablet Take 81 mg by mouth daily.   atorvastatin 40 MG tablet Commonly known as: LIPITOR Take 1 tablet (40 mg total) by mouth daily.   colchicine 0.6 MG tablet 2 tablets at pain onset, may repeat x1in 1 hour. No more then 3 tablets in 24 hours   fish oil-omega-3 fatty acids 1000 MG capsule Take 1 g by mouth daily.   lisinopril-hydrochlorothiazide 20-12.5 MG tablet Commonly known as: ZESTORETIC Take 1 tablet by mouth daily.   multivitamin tablet Take 1 tablet by mouth daily.   OVER THE COUNTER MEDICATION Take 250 mg by mouth 2 (two) times daily. Super beta prostate   sildenafil 100 MG tablet Commonly known as: VIAGRA Take 0.5 tablets (50 mg total) by mouth as needed for erectile dysfunction.   sitaGLIPtin 100 MG tablet Commonly known as: Januvia Take 1 tablet (100 mg total) by mouth daily.   Vitamin D3 125 MCG (5000 UT) Caps Take 1 capsule by mouth daily.         Objective:   BP 138/89   Pulse 99   Ht 5\' 5"  (1.651 m)   Wt 233 lb (105.7  kg)   SpO2 98%   BMI 38.77 kg/m   Wt Readings from Last 3 Encounters:  06/16/20 233 lb (105.7 kg)  03/14/20 230 lb (104.3 kg)  12/12/19 218 lb (98.9 kg)    Physical Exam Vitals and nursing note reviewed.  Constitutional:      General: He is not in acute distress.    Appearance: He is well-developed. He is not diaphoretic.  Eyes:     General: No scleral icterus.    Conjunctiva/sclera: Conjunctivae normal.  Neck:     Thyroid: No thyromegaly.  Cardiovascular:     Rate and Rhythm: Normal rate and regular rhythm.     Heart sounds: Normal heart sounds. No murmur heard. Pulmonary:     Effort:  Pulmonary effort is normal. No respiratory distress.     Breath sounds: Normal breath sounds. No wheezing.  Musculoskeletal:        General: Normal range of motion.     Cervical back: Neck supple.  Lymphadenopathy:     Cervical: No cervical adenopathy.  Skin:    General: Skin is warm and dry.     Findings: No rash.  Neurological:     Mental Status: He is alert and oriented to person, place, and time.     Coordination: Coordination normal.  Psychiatric:        Behavior: Behavior normal.      Assessment & Plan:   Problem List Items Addressed This Visit       Cardiovascular and Mediastinum   Hypertension associated with diabetes ([place])     Endocrine   Hyperlipidemia associated with type 2 diabetes mellitus ([place])   Type 2 diabetes mellitus with diabetic chronic kidney disease ([place]) - Primary   Relevant Orders   Bayer DCA Hb A1c Waived   CBC with Differential/Platelet   CMP14+EGFR   Lipid panel     Genitourinary   Chronic kidney disease (CKD), stage III (moderate) (HCC)    A1c 7.4, recommended focusing on diet and exercise, no medication changes at this point. Follow up plan: Return in about 3 months (around 09/16/2020), or if symptoms worsen or fail to improve, for Diabetes recheck..  Counseling provided for all of the vaccine components Orders Placed This Encounter  Procedures   Bayer [place] Hb A1c Waived   CBC with Differential/Platelet   CMP14+EGFR   Lipid panel    Caryl Pina, MD [facility] 06/16/2020, 10:08 AM

## 2020-06-17 LAB — CBC WITH DIFFERENTIAL/PLATELET
Basophils Absolute: 0 10*3/uL (ref 0.0–0.2)
Basos: 0 %
EOS (ABSOLUTE): 0.2 10*3/uL (ref 0.0–0.4)
Eos: 2 %
Hematocrit: 44.2 % (ref 37.5–51.0)
Hemoglobin: 15.1 g/dL (ref 13.0–17.7)
Immature Grans (Abs): 0 10*3/uL (ref 0.0–0.1)
Immature Granulocytes: 0 %
Lymphocytes Absolute: 3.3 10*3/uL — ABNORMAL HIGH (ref 0.7–3.1)
Lymphs: 34 %
MCH: 29.7 pg (ref 26.6–33.0)
MCHC: 34.2 g/dL (ref 31.5–35.7)
MCV: 87 fL (ref 79–97)
Monocytes Absolute: 0.9 10*3/uL (ref 0.1–0.9)
Monocytes: 9 %
Neutrophils Absolute: 5.4 10*3/uL (ref 1.4–7.0)
Neutrophils: 55 %
Platelets: 278 10*3/uL (ref 150–450)
RBC: 5.08 x10E6/uL (ref 4.14–5.80)
RDW: 13.7 % (ref 11.6–15.4)
WBC: 9.7 10*3/uL (ref 3.4–10.8)

## 2020-06-17 LAB — CMP14+EGFR
ALT: 25 IU/L (ref 0–44)
AST: 25 IU/L (ref 0–40)
Albumin/Globulin Ratio: 1.5 (ref 1.2–2.2)
Albumin: 4.4 g/dL (ref 3.8–4.8)
Alkaline Phosphatase: 86 IU/L (ref 44–121)
BUN/Creatinine Ratio: 8 — ABNORMAL LOW (ref 10–24)
BUN: 13 mg/dL (ref 8–27)
Bilirubin Total: 1 mg/dL (ref 0.0–1.2)
CO2: 24 mmol/L (ref 20–29)
Calcium: 9.6 mg/dL (ref 8.6–10.2)
Chloride: 101 mmol/L (ref 96–106)
Creatinine, Ser: 1.56 mg/dL — ABNORMAL HIGH (ref 0.76–1.27)
Globulin, Total: 3 g/dL (ref 1.5–4.5)
Glucose: 150 mg/dL — ABNORMAL HIGH (ref 65–99)
Potassium: 4.1 mmol/L (ref 3.5–5.2)
Sodium: 141 mmol/L (ref 134–144)
Total Protein: 7.4 g/dL (ref 6.0–8.5)
eGFR: 48 mL/min/{1.73_m2} — ABNORMAL LOW (ref >59)

## 2020-06-17 LAB — LIPID PANEL
Chol/HDL Ratio: 1.9 ratio (ref 0.0–5.0)
Cholesterol, Total: 98 mg/dL — ABNORMAL LOW (ref 100–199)
HDL: 52 mg/dL (ref >39)
LDL Chol Calc (NIH): 34 mg/dL (ref 0–99)
Triglycerides: 49 mg/dL (ref 0–149)
VLDL Cholesterol Cal: 12 mg/dL (ref 5–40)

## 2020-07-01 DIAGNOSIS — E1129 Type 2 diabetes mellitus with other diabetic kidney complication: Secondary | ICD-10-CM | POA: Diagnosis not present

## 2020-07-01 DIAGNOSIS — R809 Proteinuria, unspecified: Secondary | ICD-10-CM | POA: Diagnosis not present

## 2020-07-01 DIAGNOSIS — E1122 Type 2 diabetes mellitus with diabetic chronic kidney disease: Secondary | ICD-10-CM | POA: Diagnosis not present

## 2020-07-01 DIAGNOSIS — E6609 Other obesity due to excess calories: Secondary | ICD-10-CM | POA: Diagnosis not present

## 2020-07-01 DIAGNOSIS — I129 Hypertensive chronic kidney disease with stage 1 through stage 4 chronic kidney disease, or unspecified chronic kidney disease: Secondary | ICD-10-CM | POA: Diagnosis not present

## 2020-07-01 DIAGNOSIS — N189 Chronic kidney disease, unspecified: Secondary | ICD-10-CM | POA: Diagnosis not present

## 2020-07-02 ENCOUNTER — Ambulatory Visit (INDEPENDENT_AMBULATORY_CARE_PROVIDER_SITE_OTHER): Payer: Medicare HMO

## 2020-07-02 VITALS — Ht 65.0 in | Wt 233.0 lb

## 2020-07-02 DIAGNOSIS — Z0000 Encounter for general adult medical examination without abnormal findings: Secondary | ICD-10-CM | POA: Diagnosis not present

## 2020-07-02 DIAGNOSIS — Z1211 Encounter for screening for malignant neoplasm of colon: Secondary | ICD-10-CM | POA: Diagnosis not present

## 2020-07-02 NOTE — Patient Instructions (Signed)
Paul Williams , Thank you for taking time to come for your Medicare Wellness Visit. I appreciate your ongoing commitment to your health goals. Please review the following plan we discussed and let me know if I can assist you in the future.   Screening recommendations/referrals: Colonoscopy: Done 09/04/2015 - Repeat in 5 years Recommended yearly ophthalmology/optometry visit for glaucoma screening and checkup Recommended yearly dental visit for hygiene and checkup  Vaccinations: Influenza vaccine: Declined (due every fall) Pneumococcal vaccine: Prevnar done 08/05/2016; due for Pneumovax Tdap vaccine: Done 09/02/2016 - Repeat in 10 years Shingles vaccine: Due (2 doses 2-6 months apart - over 90% effective)   Covid-19: Done 03/2020 & 04/2020 - due for booster in October  Advanced directives: Please bring a copy of your health care power of attorney and living will to the office to be added to your chart at your convenience.  Conditions/risks identified: Aim for 30 minutes of exercise or brisk walking each day, drink 6-8 glasses of water and eat lots of fruits and vegetables.  Next appointment: Follow up in one year for your annual wellness visit.   Preventive Care 19 Years and Older, Male  Preventive care refers to lifestyle choices and visits with your health care provider that can promote health and wellness. What does preventive care include? A yearly physical exam. This is also called an annual well check. Dental exams once or twice a year. Routine eye exams. Ask your health care provider how often you should have your eyes checked. Personal lifestyle choices, including: Daily care of your teeth and gums. Regular physical activity. Eating a healthy diet. Avoiding tobacco and drug use. Limiting alcohol use. Practicing safe sex. Taking low doses of aspirin every day. Taking vitamin and mineral supplements as recommended by your health care provider. What happens during an annual well  check? The services and screenings done by your health care provider during your annual well check will depend on your age, overall health, lifestyle risk factors, and family history of disease. Counseling  Your health care provider may ask you questions about your: Alcohol use. Tobacco use. Drug use. Emotional well-being. Home and relationship well-being. Sexual activity. Eating habits. History of falls. Memory and ability to understand (cognition). Work and work Statistician. Screening  You may have the following tests or measurements: Height, weight, and BMI. Blood pressure. Lipid and cholesterol levels. These may be checked every 5 years, or more frequently if you are over 42 years old. Skin check. Lung cancer screening. You may have this screening every year starting at age 83 if you have a 30-pack-year history of smoking and currently smoke or have quit within the past 15 years. Fecal occult blood test (FOBT) of the stool. You may have this test every year starting at age 21. Flexible sigmoidoscopy or colonoscopy. You may have a sigmoidoscopy every 5 years or a colonoscopy every 10 years starting at age 58. Prostate cancer screening. Recommendations will vary depending on your family history and other risks. Hepatitis C blood test. Hepatitis B blood test. Sexually transmitted disease (STD) testing. Diabetes screening. This is done by checking your blood sugar (glucose) after you have not eaten for a while (fasting). You may have this done every 1-3 years. Abdominal aortic aneurysm (AAA) screening. You may need this if you are a current or former smoker. Osteoporosis. You may be screened starting at age 23 if you are at high risk. Talk with your health care provider about your test results, treatment options, and if  necessary, the need for more tests. Vaccines  Your health care provider may recommend certain vaccines, such as: Influenza vaccine. This is recommended every  year. Tetanus, diphtheria, and acellular pertussis (Tdap, Td) vaccine. You may need a Td booster every 10 years. Zoster vaccine. You may need this after age 35. Pneumococcal 13-valent conjugate (PCV13) vaccine. One dose is recommended after age 40. Pneumococcal polysaccharide (PPSV23) vaccine. One dose is recommended after age 32. Talk to your health care provider about which screenings and vaccines you need and how often you need them. This information is not intended to replace advice given to you by your health care provider. Make sure you discuss any questions you have with your health care provider. Document Released: 01/17/2015 Document Revised: 09/10/2015 Document Reviewed: 10/22/2014 Elsevier Interactive Patient Education  2017 [employer] Prevention in the Home Falls can cause injuries. They can happen to people of all ages. There are many things you can do to make your home safe and to help prevent falls. What can I do on the outside of my home? Regularly fix the edges of walkways and driveways and fix any cracks. Remove anything that might make you trip as you walk through a door, such as a raised step or threshold. Trim any bushes or trees on the path to your home. Use bright outdoor lighting. Clear any walking paths of anything that might make someone trip, such as rocks or tools. Regularly check to see if handrails are loose or broken. Make sure that both sides of any steps have handrails. Any raised decks and porches should have guardrails on the edges. Have any leaves, snow, or ice cleared regularly. Use sand or salt on walking paths during winter. Clean up any spills in your garage right away. This includes oil or grease spills. What can I do in the bathroom? Use night lights. Install grab bars by the toilet and in the tub and shower. Do not use towel bars as grab bars. Use non-skid mats or decals in the tub or shower. If you need to sit down in the shower, use a  plastic, non-slip stool. Keep the floor dry. Clean up any water that spills on the floor as soon as it happens. Remove soap buildup in the tub or shower regularly. Attach bath mats securely with double-sided non-slip rug tape. Do not have throw rugs and other things on the floor that can make you trip. What can I do in the bedroom? Use night lights. Make sure that you have a light by your bed that is easy to reach. Do not use any sheets or blankets that are too big for your bed. They should not hang down onto the floor. Have a firm chair that has side arms. You can use this for support while you get dressed. Do not have throw rugs and other things on the floor that can make you trip. What can I do in the kitchen? Clean up any spills right away. Avoid walking on wet floors. Keep items that you use a lot in easy-to-reach places. If you need to reach something above you, use a strong step stool that has a grab bar. Keep electrical cords out of the way. Do not use floor polish or wax that makes floors slippery. If you must use wax, use non-skid floor wax. Do not have throw rugs and other things on the floor that can make you trip. What can I do with my stairs? Do not leave any items  on the stairs. Make sure that there are handrails on both sides of the stairs and use them. Fix handrails that are broken or loose. Make sure that handrails are as long as the stairways. Check any carpeting to make sure that it is firmly attached to the stairs. Fix any carpet that is loose or worn. Avoid having throw rugs at the top or bottom of the stairs. If you do have throw rugs, attach them to the floor with carpet tape. Make sure that you have a light switch at the top of the stairs and the bottom of the stairs. If you do not have them, ask someone to add them for you. What else can I do to help prevent falls? Wear shoes that: Do not have high heels. Have rubber bottoms. Are comfortable and fit you  well. Are closed at the toe. Do not wear sandals. If you use a stepladder: Make sure that it is fully opened. Do not climb a closed stepladder. Make sure that both sides of the stepladder are locked into place. Ask someone to hold it for you, if possible. Clearly mark and make sure that you can see: Any grab bars or handrails. First and last steps. Where the edge of each step is. Use tools that help you move around (mobility aids) if they are needed. These include: Canes. Walkers. Scooters. Crutches. Turn on the lights when you go into a dark area. Replace any light bulbs as soon as they burn out. Set up your furniture so you have a clear path. Avoid moving your furniture around. If any of your floors are uneven, fix them. If there are any pets around you, be aware of where they are. Review your medicines with your doctor. Some medicines can make you feel dizzy. This can increase your chance of falling. Ask your doctor what other things that you can do to help prevent falls. This information is not intended to replace advice given to you by your health care provider. Make sure you discuss any questions you have with your health care provider. Document Released: 10/17/2008 Document Revised: 05/29/2015 Document Reviewed: 01/25/2014 Elsevier Interactive Patient Education  2017 [employer].

## 2020-07-02 NOTE — Progress Notes (Signed)
Subjective:   Kirkland Figg is a 70 y.o. male who presents for Medicare Annual/Subsequent preventive examination.  Virtual Visit via Telephone Note  I connected with  Dwight Cervini on 07/02/20 at  9:00 AM EDT by telephone and verified that I am speaking with the correct person using two identifiers.  Location: Patient: Home Provider: [facility] Persons participating in the virtual visit: patient/Nurse Health Advisor   I discussed the limitations, risks, security and privacy concerns of performing an evaluation and management service by telephone and the availability of in person appointments. The patient expressed understanding and agreed to proceed.  Interactive audio and video telecommunications were attempted between this nurse and patient, however failed, due to patient having technical difficulties OR patient did not have access to video capability.  We continued and completed visit with audio only.  Some vital signs may be absent or patient reported.   Aristeo Hankerson E Kaitlyn Franko, LPN   Review of Systems     Cardiac Risk Factors include: advanced age (>47men, >26 women);diabetes mellitus;hypertension;dyslipidemia;male gender;obesity (BMI >30kg/m2);sedentary lifestyle     Objective:    Today's Vitals   07/02/20 0858  Weight: 233 lb (105.7 kg)  Height: 5\' 5"  (1.651 m)   Body mass index is 38.77 kg/m.  Advanced Directives 07/02/2020 06/26/2019 06/22/2018  Does Patient Have a Medical Advance Directive? No No No  Would patient like information on creating a medical advance directive? No - Patient declined No - Patient declined -    Current Medications (verified) Outpatient Encounter Medications as of 07/02/2020  Medication Sig   allopurinol (ZYLOPRIM) 100 MG tablet Take 0.5 tablets (50 mg total) by mouth daily.   aspirin EC 81 MG tablet Take 81 mg by mouth daily.   atorvastatin (LIPITOR) 40 MG tablet Take 1 tablet (40 mg total) by mouth daily.   Cholecalciferol (VITAMIN D3) 5000 UNITS CAPS  Take 1 capsule by mouth daily.   colchicine 0.6 MG tablet 2 tablets at pain onset, may repeat x1in 1 hour. No more then 3 tablets in 24 hours   fish oil-omega-3 fatty acids 1000 MG capsule Take 1 g by mouth daily.   lisinopril-hydrochlorothiazide (ZESTORETIC) 20-12.5 MG tablet Take 1 tablet by mouth daily.   Multiple Vitamin (MULTIVITAMIN) tablet Take 1 tablet by mouth daily.   OVER THE COUNTER MEDICATION Take 250 mg by mouth 2 (two) times daily. Super beta prostate   sildenafil (VIAGRA) 100 MG tablet Take 0.5 tablets (50 mg total) by mouth as needed for erectile dysfunction.   sitaGLIPtin (JANUVIA) 100 MG tablet Take 1 tablet (100 mg total) by mouth daily.   No facility-administered encounter medications on file as of 07/02/2020.    Allergies (verified) Patient has no known allergies.   History: Past Medical History:  Diagnosis Date   Adenomatous polyp of colon    Diabetes mellitus without complication ([place])    Diverticulosis    Hyperlipidemia    Hypertension    Internal hemorrhoids    Past Surgical History:  Procedure Laterality Date   COLONOSCOPY     top plate of teeth extracted     Family History  Problem Relation Age of Onset   Stroke Mother    Diabetes Father    Prostate cancer Father        prostate   Diabetes Sister    Diabetes Sister    Colon cancer Neg Hx    Esophageal cancer Neg Hx    Rectal cancer Neg Hx    Stomach cancer Neg Hx  Social History   Socioeconomic History   Marital status: Married    Spouse name: Not on file   Number of children: 1   Years of education: Not on file   Highest education level: Not on file  Occupational History   Occupation: retired    Fish farm manager: [place].  Tobacco Use   Smoking status: Never   Smokeless tobacco: Never  Vaping Use   Vaping Use: Never used  Substance and Sexual Activity   Alcohol use: Never   Drug use: No   Sexual activity: Yes    Partners: Female  Other Topics Concern   Not on file   Social History Narrative   Lives home with wife. They have one daughter in [place].   Social Determinants of Health   Financial Resource Strain: Low Risk    Difficulty of Paying Living Expenses: Not hard at all  Food Insecurity: No Food Insecurity   Worried About Charity fundraiser in the Last Year: Never true   [place] in the Last Year: Never true  Transportation Needs: No Transportation Needs   Lack of Transportation (Medical): No   Lack of Transportation (Non-Medical): No  Physical Activity: Sufficiently Active   Days of Exercise per Week: 7 days   Minutes of Exercise per Session: 30 min  Stress: No Stress Concern Present   Feeling of Stress : Not at all  Social Connections: Socially Integrated   Frequency of Communication with Friends and Family: More than three times a week   Frequency of Social Gatherings with Friends and Family: More than three times a week   Attends Religious Services: More than 4 times per year   Active Member of [employer] or Organizations: Yes   Attends Music therapist: More than 4 times per year   Marital Status: Married    Tobacco Counseling Counseling given: Not Answered   Clinical Intake:  Pre-visit preparation completed: Yes  Pain : No/denies pain     BMI - recorded: 38.77 Nutritional Status: BMI > 30  Obese Nutritional Risks: None Diabetes: Yes CBG done?: No Did pt. bring in CBG monitor from home?: No  How often do you need to have someone help you when you read instructions, pamphlets, or other written materials from your doctor or pharmacy?: 1 - Never  Nutrition Risk Assessment:  Has the patient had any N/V/D within the last 2 months?  No  Does the patient have any non-healing wounds?  No  Has the patient had any unintentional weight loss or weight gain?  No   Diabetes:  Is the patient diabetic?  Yes  If diabetic, was a CBG obtained today?  No  Did the patient bring in their glucometer from home?  No  How  often do you monitor your CBG's? 3x per week.   Financial Strains and Diabetes Management:  Are you having any financial strains with the device, your supplies or your medication? No .  Does the patient want to be seen by Chronic Care Management for management of their diabetes?  No  Would the patient like to be referred to a Nutritionist or for Diabetic Management?  No   Diabetic Exams:  Diabetic Eye Exam: Completed 12/31/2019.   Diabetic Foot Exam: Completed 03/14/20. Pt has been advised about the importance in completing this exam. Pt is scheduled for diabetic foot exam on 9/22/022.    Interpreter Needed?: No  Information entered by :: Adalberto Cole, LPN   Activities  of Daily Living In your present state of health, do you have any difficulty performing the following activities: 07/02/2020  Hearing? N  Vision? N  Difficulty concentrating or making decisions? N  Walking or climbing stairs? N  Dressing or bathing? N  Doing errands, shopping? N  Preparing Food and eating ? N  Using the Toilet? N  In the past six months, have you accidently leaked urine? N  Do you have problems with loss of bowel control? N  Managing your Medications? N  Managing your Finances? N  Housekeeping or managing your Housekeeping? N  Some recent data might be hidden    Patient Care Team: Dettinger, Fransisca Kaufmann, MD as PCP - General (Family Medicine)  Indicate any recent Medical Services you may have received from other than [facility] providers in the past year (date may be approximate).     Assessment:   This is a routine wellness examination for Timo.  Hearing/Vision screen Hearing Screening - Comments:: Denies hearing difficulties  Vision Screening - Comments:: Wears glasses prn - up to date with annual eye exams with Dr Gershon Crane  Dietary issues and exercise activities discussed: Current Exercise Habits: Home exercise routine, Type of exercise: walking;Other - see comments (yard work), Time (Minutes):  30, Frequency (Times/Week): 7, Weekly Exercise (Minutes/Week): 210, Intensity: Mild, Exercise limited by: None identified   Goals Addressed             This Visit's Progress    DIET - EAT MORE FRUITS AND VEGETABLES   On track    Exercise 150 min/wk Moderate Activity   Not on track      Depression Screen PHQ 2/9 Scores 07/02/2020 06/16/2020 03/14/2020 12/12/2019 09/07/2019 06/26/2019 06/07/2019  PHQ - 2 Score 0 0 0 0 0 0 0  PHQ- 9 Score - - - - - - -    Fall Risk Fall Risk  07/02/2020 06/16/2020 03/14/2020 12/12/2019 09/07/2019  Falls in the past year? 0 0 0 0 0  Comment - - - - -  Number falls in past yr: 0 - - - -  Injury with Fall? 0 - - - -  Risk for fall due to : No Fall Risks - - - -  Follow up Falls prevention discussed - - - -    FALL RISK PREVENTION PERTAINING TO THE HOME:  Any stairs in or around the home? No  If so, are there any without handrails? No  Home free of loose throw rugs in walkways, pet beds, electrical cords, etc? Yes  Adequate lighting in your home to reduce risk of falls? No   ASSISTIVE DEVICES UTILIZED TO PREVENT FALLS:  Life alert? No  Use of a cane, walker or w/c? No  Grab bars in the bathroom? No  Shower chair or bench in shower? Yes  Elevated toilet seat or a handicapped toilet? No   TIMED UP AND GO:  Was the test performed? No . Telephonic visit  Cognitive Function: Normal cognitive status assessed by direct observation by this Nurse Health Advisor. No abnormalities found.   MMSE - Mini Mental State Exam 06/22/2018  Orientation to time 5  Orientation to Place 5  Registration 3  Attention/ Calculation 5  Recall 3  Language- name 2 objects 2  Language- repeat 1  Language- follow 3 step command 1  Language- read & follow direction 1  Write a sentence 1  Copy design 1  Total score 28     6CIT Screen 06/26/2019  What Year?  0 points  What month? 0 points  What time? 0 points  Count back from 20 0 points  Months in reverse 0 points  Repeat  phrase 2 points  Total Score 2    Immunizations Immunization History  Administered Date(s) Administered   Moderna Sars-Covid-2 Vaccination 03/04/2020, 04/04/2020   Pneumococcal Conjugate-13 08/05/2016   Tdap 09/02/2016    TDAP status: Up to date  Flu Vaccine status: Declined, Education has been provided regarding the importance of this vaccine but patient still declined. Advised may receive this vaccine at local pharmacy or Health Dept. Aware to provide a copy of the vaccination record if obtained from local pharmacy or Health Dept. Verbalized acceptance and understanding.  Pneumococcal vaccine status: Due, Education has been provided regarding the importance of this vaccine. Advised may receive this vaccine at local pharmacy or Health Dept. Aware to provide a copy of the vaccination record if obtained from local pharmacy or Health Dept. Verbalized acceptance and understanding.  Covid-19 vaccine status: Completed vaccines  Qualifies for Shingles Vaccine? Yes   Zostavax completed No   Shingrix Completed?: No.    Education has been provided regarding the importance of this vaccine. Patient has been advised to call insurance company to determine out of pocket expense if they have not yet received this vaccine. Advised may also receive vaccine at local pharmacy or Health Dept. Verbalized acceptance and understanding.  Screening Tests Health Maintenance  Topic Date Due   PNA vac Low Risk Adult (2 of 2 - PPSV23) 09/12/2020 (Originally 08/05/2017)   Zoster Vaccines- Shingrix (1 of 2) 09/16/2021 (Originally 06/27/2002)   INFLUENZA VACCINE  08/04/2020   COLONOSCOPY (Pts 45-37yrs Insurance coverage will need to be confirmed)  09/03/2020   HEMOGLOBIN A1C  12/16/2020   OPHTHALMOLOGY EXAM  12/30/2020   FOOT EXAM  03/14/2021   TETANUS/TDAP  09/03/2026   Hepatitis C Screening  Completed   HPV VACCINES  Aged Out   COVID-19 Vaccine  Discontinued    Health Maintenance  There are no preventive  care reminders to display for this patient.  Colorectal cancer screening: Type of screening: Colonoscopy. Completed 09/04/2015. Repeat every 5 years  Lung Cancer Screening: (Low Dose CT Chest recommended if Age 66-80 years, 30 pack-year currently smoking OR have quit w/in 15years.) does not qualify.   Additional Screening:  Hepatitis C Screening: does qualify; Completed 08/05/2016  Vision Screening: Recommended annual ophthalmology exams for early detection of glaucoma and other disorders of the eye. Is the patient up to date with their annual eye exam?  Yes  Who is the provider or what is the name of the office in which the patient attends annual eye exams? [facility] If pt is not established with a provider, would they like to be referred to a provider to establish care? No .   Dental Screening: Recommended annual dental exams for proper oral hygiene  Community Resource Referral / Chronic Care Management: CRR required this visit?  No   CCM required this visit?  No      Plan:     I have personally reviewed and noted the following in the patient's chart:   Medical and social history Use of alcohol, tobacco or illicit drugs  Current medications and supplements including opioid prescriptions. Patient is not currently taking opioid prescriptions. Functional ability and status Nutritional status Physical activity Advanced directives List of other physicians Hospitalizations, surgeries, and ER visits in previous 12 months Vitals Screenings to include cognitive, depression, and falls Referrals and appointments  In addition, I have reviewed and discussed with patient certain preventive protocols, quality metrics, and best practice recommendations. A written personalized care plan for preventive services as well as general preventive health recommendations were provided to patient.     Sandrea Hammond, LPN   [id number]   Nurse Notes: None

## 2020-07-10 DIAGNOSIS — N189 Chronic kidney disease, unspecified: Secondary | ICD-10-CM | POA: Diagnosis not present

## 2020-07-10 DIAGNOSIS — I129 Hypertensive chronic kidney disease with stage 1 through stage 4 chronic kidney disease, or unspecified chronic kidney disease: Secondary | ICD-10-CM | POA: Diagnosis not present

## 2020-07-10 DIAGNOSIS — M109 Gout, unspecified: Secondary | ICD-10-CM | POA: Diagnosis not present

## 2020-07-10 DIAGNOSIS — E6609 Other obesity due to excess calories: Secondary | ICD-10-CM | POA: Diagnosis not present

## 2020-07-10 DIAGNOSIS — R809 Proteinuria, unspecified: Secondary | ICD-10-CM | POA: Diagnosis not present

## 2020-07-10 DIAGNOSIS — E1129 Type 2 diabetes mellitus with other diabetic kidney complication: Secondary | ICD-10-CM | POA: Diagnosis not present

## 2020-07-10 DIAGNOSIS — E1122 Type 2 diabetes mellitus with diabetic chronic kidney disease: Secondary | ICD-10-CM | POA: Diagnosis not present

## 2020-07-24 ENCOUNTER — Ambulatory Visit (INDEPENDENT_AMBULATORY_CARE_PROVIDER_SITE_OTHER): Payer: Medicare HMO | Admitting: Family Medicine

## 2020-07-24 ENCOUNTER — Encounter: Payer: Self-pay | Admitting: Family Medicine

## 2020-07-24 DIAGNOSIS — M10072 Idiopathic gout, left ankle and foot: Secondary | ICD-10-CM

## 2020-07-24 MED ORDER — ALLOPURINOL 100 MG PO TABS
50.0000 mg | ORAL_TABLET | Freq: Every day | ORAL | 0 refills | Status: DC
Start: 2020-07-24 — End: 2020-09-25

## 2020-07-24 MED ORDER — ALLOPURINOL 100 MG PO TABS: 50.0000 mg | ORAL_TABLET | Freq: Every day | ORAL | 0 refills | Status: DC

## 2020-07-24 MED ORDER — COLCHICINE 0.6 MG PO TABS: ORAL_TABLET | ORAL | 0 refills | Status: DC

## 2020-07-24 NOTE — Progress Notes (Signed)
   Virtual Visit  Note Due to COVID-19 pandemic this visit was conducted virtually. This visit type was conducted due to national recommendations for restrictions regarding the COVID-19 Pandemic (e.g. social distancing, sheltering in place) in an effort to limit this patient's exposure and mitigate transmission in our community. All issues noted in this document were discussed and addressed.  A physical exam was not performed with this format.  I connected with Paul Williams on 07/24/20 at 0903 by telephone and verified that I am speaking with the correct person using two identifiers. Paul Williams is currently located at home and no one is currently with him during the visit. The provider, Gwenlyn Perking, FNP is located in their office at time of visit.  I discussed the limitations, risks, security and privacy concerns of performing an evaluation and management service by telephone and the availability of in person appointments. I also discussed with the patient that there may be a patient responsible charge related to this service. The patient expressed understanding and agreed to proceed.  CC: gout  History and Present Illness:  HPI [name] reports a gout flare in his left ankle for 5 days. He reports warmth, swelling, and pain. The pain is worse with weight bearing. He was previously taking allopurinol but stopped taking this a while back. He denies fever, wound, or drainage. He has been treated successfully with colchicine for his gout flares. It has been about 2 years since his last flare.     ROS As per HPI.   Observations/Objective: Alert and oriented x 3. Able to speak in full sentences without difficulty.    Assessment and Plan: Dorn was seen today for gout.  Diagnoses and all orders for this visit:  Acute idiopathic gout of left ankle Colchicine ordered for acute flare. Discussed can restart allopurinol once flare improves.  -     colchicine 0.6 MG tablet; 2 tablets at pain  onset, may repeat x1in 1 hour. No more then 3 tablets in 24 hours -     allopurinol (ZYLOPRIM) 100 MG tablet; Take 0.5 tablets (50 mg total) by mouth daily.    Follow Up Instructions: Return to office for new or worsening symptoms, or if symptoms persist.     I discussed the assessment and treatment plan with the patient. The patient was provided an opportunity to ask questions and all were answered. The patient agreed with the plan and demonstrated an understanding of the instructions.   The patient was advised to call back or seek an in-person evaluation if the symptoms worsen or if the condition fails to improve as anticipated.  The above assessment and management plan was discussed with the patient. The patient verbalized understanding of and has agreed to the management plan. Patient is aware to call the clinic if symptoms persist or worsen. Patient is aware when to return to the clinic for a follow-up visit. Patient educated on when it is appropriate to go to the emergency department.   Time call ended:  0916  I provided 13 minutes of  non face-to-face time during this encounter.    Gwenlyn Perking, FNP

## 2020-09-08 ENCOUNTER — Encounter: Payer: Self-pay | Admitting: Internal Medicine

## 2020-09-25 ENCOUNTER — Other Ambulatory Visit: Payer: Self-pay

## 2020-09-25 ENCOUNTER — Ambulatory Visit (INDEPENDENT_AMBULATORY_CARE_PROVIDER_SITE_OTHER): Payer: Medicare HMO | Admitting: Family Medicine

## 2020-09-25 ENCOUNTER — Encounter: Payer: Self-pay | Admitting: Family Medicine

## 2020-09-25 VITALS — BP 124/76 | HR 100 | Ht 65.0 in | Wt 230.0 lb

## 2020-09-25 DIAGNOSIS — E1169 Type 2 diabetes mellitus with other specified complication: Secondary | ICD-10-CM | POA: Diagnosis not present

## 2020-09-25 DIAGNOSIS — M10072 Idiopathic gout, left ankle and foot: Secondary | ICD-10-CM

## 2020-09-25 DIAGNOSIS — N529 Male erectile dysfunction, unspecified: Secondary | ICD-10-CM | POA: Diagnosis not present

## 2020-09-25 DIAGNOSIS — N1831 Chronic kidney disease, stage 3a: Secondary | ICD-10-CM | POA: Diagnosis not present

## 2020-09-25 DIAGNOSIS — E785 Hyperlipidemia, unspecified: Secondary | ICD-10-CM

## 2020-09-25 DIAGNOSIS — E1159 Type 2 diabetes mellitus with other circulatory complications: Secondary | ICD-10-CM | POA: Diagnosis not present

## 2020-09-25 DIAGNOSIS — I152 Hypertension secondary to endocrine disorders: Secondary | ICD-10-CM

## 2020-09-25 DIAGNOSIS — Z1211 Encounter for screening for malignant neoplasm of colon: Secondary | ICD-10-CM

## 2020-09-25 DIAGNOSIS — N183 Chronic kidney disease, stage 3 unspecified: Secondary | ICD-10-CM | POA: Diagnosis not present

## 2020-09-25 DIAGNOSIS — E1122 Type 2 diabetes mellitus with diabetic chronic kidney disease: Secondary | ICD-10-CM

## 2020-09-25 LAB — BAYER DCA HB A1C WAIVED: HB A1C (BAYER DCA - WAIVED): 6.7 % — ABNORMAL HIGH (ref 4.8–5.6)

## 2020-09-25 MED ORDER — SILDENAFIL CITRATE 100 MG PO TABS: 50.0000 mg | ORAL_TABLET | ORAL | 0 refills | Status: DC | PRN

## 2020-09-25 MED ORDER — SITAGLIPTIN PHOSPHATE 100 MG PO TABS: 100.0000 mg | ORAL_TABLET | Freq: Every day | ORAL | 3 refills | Status: DC

## 2020-09-25 MED ORDER — LISINOPRIL-HYDROCHLOROTHIAZIDE 20-12.5 MG PO TABS: 1.0000 | ORAL_TABLET | Freq: Every day | ORAL | 3 refills | Status: DC

## 2020-09-25 MED ORDER — ATORVASTATIN CALCIUM 40 MG PO TABS: 40.0000 mg | ORAL_TABLET | Freq: Every day | ORAL | 3 refills | Status: DC

## 2020-09-25 MED ORDER — ALLOPURINOL 100 MG PO TABS: 50.0000 mg | ORAL_TABLET | Freq: Every day | ORAL | 3 refills | Status: DC

## 2020-09-25 MED ORDER — COLCHICINE 0.6 MG PO TABS: ORAL_TABLET | ORAL | 3 refills | Status: DC

## 2020-09-25 NOTE — Progress Notes (Signed)
BP 124/76   Pulse 100   Ht 5\' 5"  (1.651 m)   Wt 230 lb (104.3 kg)   SpO2 99%   BMI 38.27 kg/m    Subjective:   Patient ID: Paul Williams, male    DOB: 12-Jan-1950, 70 y.o.   MRN: [id number]  HPI: Paul Williams is a 70 y.o. male presenting on 09/25/2020 for Medical Management of Chronic Issues, Diabetes, and Gout   HPI Type 2 diabetes mellitus Patient comes in today for recheck of his diabetes. Patient has been currently taking Januvia, A1c is better at 6.7. Patient is currently on an ACE inhibitor/ARB. Patient has seen an ophthalmologist this year. Patient denies any issues with their feet. The symptom started onset as an adult CKD and hypertension and hyperlipidemia ARE RELATED TO DM   Hypertension Patient is currently on lisinopril hydrochlorothiazide, and their blood pressure today is 124/76. Patient denies any lightheadedness or dizziness. Patient denies headaches, blurred vision, chest pains, shortness of breath, or weakness. Denies any side effects from medication and is content with current medication.   Hyperlipidemia Patient is coming in for recheck of his hyperlipidemia. The patient is currently taking atorvastatin. They deny any issues with myalgias or history of liver damage from it. They deny any focal numbness or weakness or chest pain.   Relevant past medical, surgical, family and social history reviewed and updated as indicated. Interim medical history since our last visit reviewed. Allergies and medications reviewed and updated.  Review of Systems  Constitutional:  Negative for chills and fever.  Eyes:  Negative for visual disturbance.  Respiratory:  Negative for shortness of breath and wheezing.   Cardiovascular:  Negative for chest pain and leg swelling.  Musculoskeletal:  Negative for back pain and gait problem.  Skin:  Negative for rash.  Neurological:  Negative for dizziness, weakness, light-headedness and numbness.  All other systems reviewed and are  negative.  Per HPI unless specifically indicated above   Allergies as of 09/25/2020   No Known Allergies      Medication List        Accurate as of September 25, 2020  9:55 AM. If you have any questions, ask your nurse or doctor.          allopurinol 100 MG tablet Commonly known as: ZYLOPRIM Take 0.5 tablets (50 mg total) by mouth daily.   aspirin EC 81 MG tablet Take 81 mg by mouth daily.   atorvastatin 40 MG tablet Commonly known as: LIPITOR Take 1 tablet (40 mg total) by mouth daily.   colchicine 0.6 MG tablet 2 tablets at pain onset, may repeat x1in 1 hour. No more then 3 tablets in 24 hours   fish oil-omega-3 fatty acids 1000 MG capsule Take 1 g by mouth daily.   lisinopril-hydrochlorothiazide 20-12.5 MG tablet Commonly known as: ZESTORETIC Take 1 tablet by mouth daily.   multivitamin tablet Take 1 tablet by mouth daily.   OVER THE COUNTER MEDICATION Take 250 mg by mouth 2 (two) times daily. Super beta prostate   sildenafil 100 MG tablet Commonly known as: VIAGRA Take 0.5 tablets (50 mg total) by mouth as needed for erectile dysfunction.   sitaGLIPtin 100 MG tablet Commonly known as: Januvia Take 1 tablet (100 mg total) by mouth daily.   Vitamin D3 125 MCG (5000 UT) Caps Take 1 capsule by mouth daily.         Objective:   BP 124/76   Pulse 100   Ht 5\' 5"  (1.651  m)   Wt 230 lb (104.3 kg)   SpO2 99%   BMI 38.27 kg/m   Wt Readings from Last 3 Encounters:  09/25/20 230 lb (104.3 kg)  07/02/20 233 lb (105.7 kg)  06/16/20 233 lb (105.7 kg)    Physical Exam Vitals and nursing note reviewed.  Constitutional:      General: He is not in acute distress.    Appearance: He is well-developed. He is not diaphoretic.  Eyes:     General: No scleral icterus.    Conjunctiva/sclera: Conjunctivae normal.  Neck:     Thyroid: No thyromegaly.  Cardiovascular:     Rate and Rhythm: Normal rate and regular rhythm.     Heart sounds: Normal heart  sounds. No murmur heard. Pulmonary:     Effort: Pulmonary effort is normal. No respiratory distress.     Breath sounds: Normal breath sounds. No wheezing.  Musculoskeletal:        General: Normal range of motion.     Cervical back: Neck supple.  Lymphadenopathy:     Cervical: No cervical adenopathy.  Skin:    General: Skin is warm and dry.     Findings: No rash.  Neurological:     Mental Status: He is alert and oriented to person, place, and time.     Coordination: Coordination normal.  Psychiatric:        Behavior: Behavior normal.      Assessment & Plan:   Problem List Items Addressed This Visit       Cardiovascular and Mediastinum   Hypertension associated with diabetes ([place])   Relevant Medications   atorvastatin (LIPITOR) 40 MG tablet   lisinopril-hydrochlorothiazide (ZESTORETIC) 20-12.5 MG tablet   sildenafil (VIAGRA) 100 MG tablet   sitaGLIPtin (JANUVIA) 100 MG tablet     Endocrine   Hyperlipidemia associated with type 2 diabetes mellitus (HCC)   Relevant Medications   atorvastatin (LIPITOR) 40 MG tablet   lisinopril-hydrochlorothiazide (ZESTORETIC) 20-12.5 MG tablet   sitaGLIPtin (JANUVIA) 100 MG tablet   Type 2 diabetes mellitus with diabetic chronic kidney disease (HCC) - Primary   Relevant Medications   atorvastatin (LIPITOR) 40 MG tablet   lisinopril-hydrochlorothiazide (ZESTORETIC) 20-12.5 MG tablet   sitaGLIPtin (JANUVIA) 100 MG tablet   Other Relevant Orders   Bayer DCA Hb A1c Waived     Genitourinary   Chronic kidney disease (CKD), stage III (moderate) (HCC)   Other Visit Diagnoses     Colon cancer screening       Relevant Orders   Ambulatory referral to Gastroenterology   Acute idiopathic gout of left ankle       Relevant Medications   colchicine 0.6 MG tablet   allopurinol (ZYLOPRIM) 100 MG tablet   Erectile dysfunction, unspecified erectile dysfunction type       Relevant Medications   sildenafil (VIAGRA) 100 MG tablet       A1c  looks better 6.7, continue current medicines.  Patient did have a gout flare but is doing better, continue the allopurinol.  We will do a referral for colonoscopy. Follow up plan: Return in about 3 months (around 12/25/2020), or if symptoms worsen or fail to improve, for Diabetes.  Counseling provided for all of the vaccine components Orders Placed This Encounter  Procedures   Bayer [place] Hb A1c Waived   Ambulatory referral to Gastroenterology    Caryl Pina, MD [facility] 09/25/2020, 9:55 AM

## 2020-10-08 DIAGNOSIS — N189 Chronic kidney disease, unspecified: Secondary | ICD-10-CM | POA: Diagnosis not present

## 2020-10-08 DIAGNOSIS — R809 Proteinuria, unspecified: Secondary | ICD-10-CM | POA: Diagnosis not present

## 2020-10-08 DIAGNOSIS — I129 Hypertensive chronic kidney disease with stage 1 through stage 4 chronic kidney disease, or unspecified chronic kidney disease: Secondary | ICD-10-CM | POA: Diagnosis not present

## 2020-10-08 DIAGNOSIS — E1129 Type 2 diabetes mellitus with other diabetic kidney complication: Secondary | ICD-10-CM | POA: Diagnosis not present

## 2020-10-08 DIAGNOSIS — E6609 Other obesity due to excess calories: Secondary | ICD-10-CM | POA: Diagnosis not present

## 2020-10-08 DIAGNOSIS — M109 Gout, unspecified: Secondary | ICD-10-CM | POA: Diagnosis not present

## 2020-10-08 DIAGNOSIS — E1122 Type 2 diabetes mellitus with diabetic chronic kidney disease: Secondary | ICD-10-CM | POA: Diagnosis not present

## 2020-10-13 ENCOUNTER — Encounter: Payer: Self-pay | Admitting: Internal Medicine

## 2020-10-15 DIAGNOSIS — Z23 Encounter for immunization: Secondary | ICD-10-CM | POA: Diagnosis not present

## 2020-10-16 DIAGNOSIS — N189 Chronic kidney disease, unspecified: Secondary | ICD-10-CM | POA: Diagnosis not present

## 2020-10-16 DIAGNOSIS — E1122 Type 2 diabetes mellitus with diabetic chronic kidney disease: Secondary | ICD-10-CM | POA: Diagnosis not present

## 2020-10-16 DIAGNOSIS — E1129 Type 2 diabetes mellitus with other diabetic kidney complication: Secondary | ICD-10-CM | POA: Diagnosis not present

## 2020-10-16 DIAGNOSIS — I129 Hypertensive chronic kidney disease with stage 1 through stage 4 chronic kidney disease, or unspecified chronic kidney disease: Secondary | ICD-10-CM | POA: Diagnosis not present

## 2020-10-16 DIAGNOSIS — M109 Gout, unspecified: Secondary | ICD-10-CM | POA: Diagnosis not present

## 2020-10-16 DIAGNOSIS — R809 Proteinuria, unspecified: Secondary | ICD-10-CM | POA: Diagnosis not present

## 2020-10-16 DIAGNOSIS — E6609 Other obesity due to excess calories: Secondary | ICD-10-CM | POA: Diagnosis not present

## 2020-10-20 ENCOUNTER — Encounter: Payer: Self-pay | Admitting: Internal Medicine

## 2020-10-20 ENCOUNTER — Other Ambulatory Visit: Payer: Self-pay

## 2020-10-20 ENCOUNTER — Ambulatory Visit (AMBULATORY_SURGERY_CENTER): Payer: Medicare HMO | Admitting: *Deleted

## 2020-10-20 VITALS — Ht 65.0 in | Wt 230.0 lb

## 2020-10-20 DIAGNOSIS — Z86010 Personal history of colonic polyps: Secondary | ICD-10-CM

## 2020-10-20 MED ORDER — NA SULFATE-K SULFATE-MG SULF 17.5-3.13-1.6 GM/177ML PO SOLN: 1.0000 | Freq: Once | ORAL | 0 refills | Status: AC

## 2020-10-20 NOTE — Progress Notes (Signed)
Pt verified name, DOB, address and insurance during PV today.  Pt mailed instruction packet of copy of consent form to read and not return, and instructions . PV completed over the phone.  Pt encouraged to call with questions or issues.  My Chart instructions to pt as well   Emailed instructions to [email or web address]  No egg or soy allergy known to patient  No issues known to pt with past sedation with any surgeries or procedures Patient denies ever being told they had issues or difficulty with intubation  No FH of Malignant Hyperthermia Pt is not on diet pills Pt is not on  home 02  Pt is not on blood thinners  Pt denies issues with constipation  No A fib or A flutter  Pt is fully vaccinated  for Covid    NO PA's for preps discussed with pt In PV today  Discussed with pt there will be an out-of-pocket cost for prep and that varies from $0 to 70 + dollars - pt verbalized understanding   Due to the COVID-19 pandemic we are asking patients to follow certain guidelines in PV and the [place]   Pt aware of COVID protocols and [facility] guidelines

## 2020-10-27 ENCOUNTER — Encounter: Payer: Self-pay | Admitting: Internal Medicine

## 2020-10-27 ENCOUNTER — Other Ambulatory Visit: Payer: Self-pay

## 2020-10-27 ENCOUNTER — Ambulatory Visit (AMBULATORY_SURGERY_CENTER): Payer: Medicare HMO | Admitting: Internal Medicine

## 2020-10-27 VITALS — BP 100/66 | HR 95 | Temp 98.0°F | Resp 19 | Ht 65.0 in | Wt 230.0 lb

## 2020-10-27 DIAGNOSIS — D128 Benign neoplasm of rectum: Secondary | ICD-10-CM | POA: Diagnosis not present

## 2020-10-27 DIAGNOSIS — K621 Rectal polyp: Secondary | ICD-10-CM | POA: Diagnosis not present

## 2020-10-27 DIAGNOSIS — Z86010 Personal history of colonic polyps: Secondary | ICD-10-CM

## 2020-10-27 DIAGNOSIS — D120 Benign neoplasm of cecum: Secondary | ICD-10-CM

## 2020-10-27 DIAGNOSIS — D129 Benign neoplasm of anus and anal canal: Secondary | ICD-10-CM

## 2020-10-27 DIAGNOSIS — I10 Essential (primary) hypertension: Secondary | ICD-10-CM | POA: Diagnosis not present

## 2020-10-27 DIAGNOSIS — E119 Type 2 diabetes mellitus without complications: Secondary | ICD-10-CM | POA: Diagnosis not present

## 2020-10-27 MED ORDER — SODIUM CHLORIDE 0.9 % IV SOLN: 500.0000 mL | Freq: Once | INTRAVENOUS | Status: DC

## 2020-10-27 NOTE — Progress Notes (Signed)
Called to room to assist during endoscopic procedure.  Patient ID and intended procedure confirmed with present staff. Received instructions for my participation in the procedure from the performing physician.  

## 2020-10-27 NOTE — Progress Notes (Signed)
1125 HR > 100 with esmolol 25 mg given IV, MD updated, vss 

## 2020-10-27 NOTE — Progress Notes (Signed)
GASTROENTEROLOGY PROCEDURE H&P NOTE   Primary Care Physician: Dettinger, Fransisca Kaufmann, MD    Reason for Procedure:  History of tubular adenomas of the colon  Plan:    Surveillance colonoscopy  Patient is appropriate for endoscopic procedure(s) in the ambulatory ([place]) setting.  The nature of the procedure, as well as the risks, benefits, and alternatives were carefully and thoroughly reviewed with the patient. Ample time for discussion and questions allowed. The patient understood, was satisfied, and agreed to proceed.     HPI: Paul Williams is a 70 y.o. male who presents for surveillance colonoscopy given personal history of adenomatous colon polyps.  Medical history as below.  Tolerated the prep.  No complaints today including recent chest pain, shortness of breath or abdominal pain. He is nervous but reports "I know I am in good hands".    Past Medical History:  Diagnosis Date   Adenomatous polyp of colon    Diabetes mellitus without complication ([place])    Diverticulosis    Hyperlipidemia    Hypertension    Internal hemorrhoids     Past Surgical History:  Procedure Laterality Date   COLONOSCOPY     POLYPECTOMY     top plate of teeth extracted      Prior to Admission medications   Medication Sig Start Date End Date Taking? Authorizing Provider  allopurinol (ZYLOPRIM) 100 MG tablet Take 0.5 tablets (50 mg total) by mouth daily. 09/25/20  Yes Dettinger, Fransisca Kaufmann, MD  aspirin EC 81 MG tablet Take 81 mg by mouth daily.   Yes [provider]  atorvastatin (LIPITOR) 40 MG tablet Take 1 tablet (40 mg total) by mouth daily. 09/25/20  Yes Dettinger, Fransisca Kaufmann, MD  Cholecalciferol (VITAMIN D3) 5000 UNITS CAPS Take 1 capsule by mouth daily.   Yes [provider]  fish oil-omega-3 fatty acids 1000 MG capsule Take 1 g by mouth daily.   Yes [provider]  lisinopril-hydrochlorothiazide (ZESTORETIC) 20-12.5 MG tablet Take 1 tablet by mouth daily. 09/25/20  Yes  Dettinger, Fransisca Kaufmann, MD  Multiple Vitamin (MULTIVITAMIN) tablet Take 1 tablet by mouth daily.   Yes [provider]  OVER THE COUNTER MEDICATION Take 250 mg by mouth 2 (two) times daily. Super beta prostate   Yes [provider]  sitaGLIPtin (JANUVIA) 100 MG tablet Take 1 tablet (100 mg total) by mouth daily. 09/25/20  Yes Dettinger, Fransisca Kaufmann, MD  colchicine 0.6 MG tablet 2 tablets at pain onset, may repeat x1in 1 hour. No more then 3 tablets in 24 hours 09/25/20   Dettinger, Fransisca Kaufmann, MD  sildenafil (VIAGRA) 100 MG tablet Take 0.5 tablets (50 mg total) by mouth as needed for erectile dysfunction. 09/25/20   Dettinger, Fransisca Kaufmann, MD    Current Outpatient Medications  Medication Sig Dispense Refill   allopurinol (ZYLOPRIM) 100 MG tablet Take 0.5 tablets (50 mg total) by mouth daily. 45 tablet 3   aspirin EC 81 MG tablet Take 81 mg by mouth daily.     atorvastatin (LIPITOR) 40 MG tablet Take 1 tablet (40 mg total) by mouth daily. 90 tablet 3   Cholecalciferol (VITAMIN D3) 5000 UNITS CAPS Take 1 capsule by mouth daily.     fish oil-omega-3 fatty acids 1000 MG capsule Take 1 g by mouth daily.     lisinopril-hydrochlorothiazide (ZESTORETIC) 20-12.5 MG tablet Take 1 tablet by mouth daily. 90 tablet 3   Multiple Vitamin (MULTIVITAMIN) tablet Take 1 tablet by mouth daily.  OVER THE COUNTER MEDICATION Take 250 mg by mouth 2 (two) times daily. Super beta prostate     sitaGLIPtin (JANUVIA) 100 MG tablet Take 1 tablet (100 mg total) by mouth daily. 90 tablet 3   colchicine 0.6 MG tablet 2 tablets at pain onset, may repeat x1in 1 hour. No more then 3 tablets in 24 hours 30 tablet 3   sildenafil (VIAGRA) 100 MG tablet Take 0.5 tablets (50 mg total) by mouth as needed for erectile dysfunction. 10 tablet 0   Current Facility-Administered Medications  Medication Dose Route Frequency Provider Last Rate Last Admin   0.9 %  sodium chloride infusion  500 mL Intravenous Once Metztli Sachdev, Lajuan Lines, MD         Allergies as of 10/27/2020   (No Known Allergies)    Family History  Problem Relation Age of Onset   Stroke Mother    Diabetes Father    Prostate cancer Father        prostate   Diabetes Sister    Diabetes Sister    Colon cancer Neg Hx    Esophageal cancer Neg Hx    Rectal cancer Neg Hx    Stomach cancer Neg Hx    Colon polyps Neg Hx     Social History   Socioeconomic History   Marital status: Married    Spouse name: Not on file   Number of children: 1   Years of education: Not on file   Highest education level: Not on file  Occupational History   Occupation: retired    Fish farm manager: [employer].  Tobacco Use   Smoking status: Never   Smokeless tobacco: Never  Vaping Use   Vaping Use: Never used  Substance and Sexual Activity   Alcohol use: Never   Drug use: No   Sexual activity: Yes    Partners: Female  Other Topics Concern   Not on file  Social History Narrative   Lives home with wife. They have one daughter in [place].   Social Determinants of Health   Financial Resource Strain: Low Risk    Difficulty of Paying Living Expenses: Not hard at all  Food Insecurity: No Food Insecurity   Worried About Charity fundraiser in the Last Year: Never true   [place] in the Last Year: Never true  Transportation Needs: No Transportation Needs   Lack of Transportation (Medical): No   Lack of Transportation (Non-Medical): No  Physical Activity: Sufficiently Active   Days of Exercise per Week: 7 days   Minutes of Exercise per Session: 30 min  Stress: No Stress Concern Present   Feeling of Stress : Not at all  Social Connections: Socially Integrated   Frequency of Communication with Friends and Family: More than three times a week   Frequency of Social Gatherings with Friends and Family: More than three times a week   Attends Religious Services: More than 4 times per year   Active Member of [employer] or Organizations: Yes   Attends Arts administrator: More than 4 times per year   Marital Status: Married  Human resources officer Violence: Not At Risk   Fear of Current or Ex-Partner: No   Emotionally Abused: No   Physically Abused: No   Sexually Abused: No    Physical Exam: Vital signs in last 24 hours: @BP  (!) 152/94   Pulse (!) 102   Temp 98 F (36.7 C) (Temporal)   Ht 5\' 5"  (1.651 m)  Wt 230 lb (104.3 kg)   SpO2 97%   BMI 38.27 kg/m  GEN: NAD EYE: Sclerae anicteric ENT: MMM CV: Non-tachycardic Pulm: CTA b/l GI: Soft, NT/ND NEURO:  Alert & Oriented x 3   Zenovia Jarred, MD [facility]  10/27/2020 11:14 AM

## 2020-10-27 NOTE — Patient Instructions (Signed)
YOU HAD AN ENDOSCOPIC PROCEDURE TODAY AT THE [facility]:   Refer to the procedure report that was given to you for any specific questions about what was found during the examination.  If the procedure report does not answer your questions, please call your gastroenterologist to clarify.  If you requested that your care partner not be given the details of your procedure findings, then the procedure report has been included in a sealed envelope for you to review at your convenience later. ° °**Handouts given on polyps and diverticulosis** ° °YOU SHOULD EXPECT: Some feelings of bloating in the abdomen. Passage of more gas than usual.  Walking can help get rid of the air that was put into your GI tract during the procedure and reduce the bloating. If you had a lower endoscopy (such as a colonoscopy or flexible sigmoidoscopy) you may notice spotting of blood in your stool or on the toilet paper. If you underwent a bowel prep for your procedure, you may not have a normal bowel movement for a few days. ° °Please Note:  You might notice some irritation and congestion in your nose or some drainage.  This is from the oxygen used during your procedure.  There is no need for concern and it should clear up in a day or so. ° °SYMPTOMS TO REPORT IMMEDIATELY: ° °Following lower endoscopy (colonoscopy or flexible sigmoidoscopy): ° Excessive amounts of blood in the stool ° Significant tenderness or worsening of abdominal pains ° Swelling of the abdomen that is new, acute ° Fever of 100°F or higher ° °For urgent or emergent issues, a gastroenterologist can be reached at any hour by calling [phone]. °Do not use MyChart messaging for urgent concerns.  ° ° °DIET:  We do recommend a small meal at first, but then you may proceed to your regular diet.  Drink plenty of fluids but you should avoid alcoholic beverages for 24 hours. ° °ACTIVITY:  You should plan to take it easy for the rest of today and you should NOT DRIVE  or use heavy machinery until tomorrow (because of the sedation medicines used during the test).   ° °FOLLOW UP: °Our staff will call the number listed on your records 48-72 hours following your procedure to check on you and address any questions or concerns that you may have regarding the information given to you following your procedure. If we do not reach you, we will leave a message.  We will attempt to reach you two times.  During this call, we will ask if you have developed any symptoms of COVID 19. If you develop any symptoms (ie: fever, flu-like symptoms, shortness of breath, cough etc.) before then, please call [phone].  If you test positive for Covid 19 in the 2 weeks post procedure, please call and report this information to us.   ° °If any biopsies were taken you will be contacted by phone or by letter within the next 1-3 weeks.  Please call us at [phone] if you have not heard about the biopsies in 3 weeks.  ° ° °SIGNATURES/CONFIDENTIALITY: °You and/or your care partner have signed paperwork which will be entered into your electronic medical record.  These signatures attest to the fact that that the information above on your After Visit Summary has been reviewed and is understood.  Full responsibility of the confidentiality of this discharge information lies with you and/or your care-partner.  °

## 2020-10-27 NOTE — Op Note (Signed)
[facility] Patient Name: Paul Williams Procedure Date: 10/27/2020 11:16 AM MRN: [id number] Endoscopist: Jerene Bears , MD Age: 70 Referring MD:  Date of Birth: [birth year] Gender: Male Account #: [id number] Procedure:                Colonoscopy Indications:              High risk colon cancer surveillance: Personal                            history of non-advanced adenoma, Last colonoscopy:                            August 2017 Medicines:                Monitored Anesthesia Care Procedure:                Pre-Anesthesia Assessment:                           - Prior to the procedure, a History and Physical                            was performed, and patient medications and                            allergies were reviewed. The patient's tolerance of                            previous anesthesia was also reviewed. The risks                            and benefits of the procedure and the sedation                            options and risks were discussed with the patient.                            All questions were answered, and informed consent                            was obtained. Prior Anticoagulants: The patient has                            taken no previous anticoagulant or antiplatelet                            agents. ASA Grade Assessment: II - A patient with                            mild systemic disease. After reviewing the risks                            and benefits, the patient was deemed in  satisfactory condition to undergo the procedure.                           After obtaining informed consent, the colonoscope                            was passed under direct vision. Throughout the                            procedure, the patient's blood pressure, pulse, and                            oxygen saturations were monitored continuously. The                            CF [id number] #[id number] was introduced through the anus                             and advanced to the cecum, identified by                            appendiceal orifice and ileocecal valve. The                            colonoscopy was performed without difficulty. The                            patient tolerated the procedure well. The quality                            of the bowel preparation was good. The ileocecal                            valve, appendiceal orifice, and rectum were                            photographed. Scope In: 11:25:16 AM Scope Out: 11:40:30 AM Scope Withdrawal Time: 0 hours 13 minutes 22 seconds  Total Procedure Duration: 0 hours 15 minutes 14 seconds  Findings:                 The digital rectal exam was normal.                           A 3 mm polyp was found in the ileocecal valve. The                            polyp was sessile. The polyp was removed with a                            cold biopsy forceps. Resection and retrieval were                            complete.  A 3 mm polyp was found in the rectum. The polyp was                            sessile. The polyp was removed with a cold biopsy                            forceps. Resection and retrieval were complete.                           Multiple small and large-mouthed diverticula were                            found from ascending colon to sigmoid colon.                           The retroflexed view of the distal rectum and anal                            verge was normal and showed no anal or rectal                            abnormalities. Complications:            No immediate complications. Estimated Blood Loss:     Estimated blood loss was minimal. Impression:               - One 3 mm polyp at the ileocecal valve, removed                            with a cold biopsy forceps. Resected and retrieved.                           - One 3 mm polyp in the rectum, removed with a cold                            biopsy forceps.  Resected and retrieved.                           - Diverticulosis from ascending colon to sigmoid                            colon.                           - The distal rectum and anal verge are normal on                            retroflexion view. Recommendation:           - Patient has a contact number available for                            emergencies. The signs and symptoms of potential  delayed complications were discussed with the                            patient. Return to normal activities tomorrow.                            Written discharge instructions were provided to the                            patient.                           - Resume previous diet.                           - Continue present medications.                           - Await pathology results.                           - Repeat colonoscopy is recommended for                            surveillance. The colonoscopy date will be                            determined after pathology results from today's                            exam become available for review. Jerene Bears, MD 10/27/2020 11:44:09 AM This report has been signed electronically.

## 2020-10-27 NOTE — Progress Notes (Signed)
1120 HR > 100 with esmolol 25 mg given IV, MD updated, vss 

## 2020-10-27 NOTE — Progress Notes (Signed)
Report given to PACU, vss 

## 2020-10-27 NOTE — Progress Notes (Signed)
Pt's states no medical or surgical changes since previsit or office visit.   Vitals JD

## 2020-10-29 ENCOUNTER — Telehealth: Payer: Self-pay

## 2020-10-29 NOTE — Telephone Encounter (Signed)
Attempted f/u call. No answer, left VM. 

## 2020-10-29 NOTE — Telephone Encounter (Signed)
  Follow up Call-  Call back number 10/27/2020  Post procedure Call Back phone  # [phone]  Permission to leave phone message Yes  Some recent data might be hidden     Patient questions:  Do you have a fever, pain , or abdominal swelling? No. Pain Score  0 *  Have you tolerated food without any problems? Yes.    Have you been able to return to your normal activities? Yes.    Do you have any questions about your discharge instructions: Diet   No. Medications  No. Follow up visit  No.  Do you have questions or concerns about your Care? No.  Actions: * If pain score is 4 or above: No action needed, pain <4.  Have you developed a fever since your procedure? no  2.   Have you had an respiratory symptoms (SOB or cough) since your procedure? no  3.   Have you tested positive for COVID 19 since your procedure no  4.   Have you had any family members/close contacts diagnosed with the COVID 19 since your procedure?  no   If yes to any of these questions please route to Joylene John, RN and Joella Prince, RN

## 2020-10-31 ENCOUNTER — Other Ambulatory Visit: Payer: Self-pay | Admitting: Family Medicine

## 2020-10-31 DIAGNOSIS — M10072 Idiopathic gout, left ankle and foot: Secondary | ICD-10-CM

## 2020-10-31 MED ORDER — MITIGARE 0.6 MG PO CAPS: ORAL_CAPSULE | ORAL | 3 refills | Status: DC

## 2020-10-31 NOTE — Progress Notes (Signed)
Patient's medicine went to the pharmacy incorrectly, sent mitigare

## 2020-11-04 ENCOUNTER — Encounter: Payer: Self-pay | Admitting: Internal Medicine

## 2020-12-22 ENCOUNTER — Encounter: Payer: Self-pay | Admitting: Family Medicine

## 2020-12-22 DIAGNOSIS — E1136 Type 2 diabetes mellitus with diabetic cataract: Secondary | ICD-10-CM | POA: Diagnosis not present

## 2020-12-22 DIAGNOSIS — H52203 Unspecified astigmatism, bilateral: Secondary | ICD-10-CM | POA: Diagnosis not present

## 2020-12-22 DIAGNOSIS — H25813 Combined forms of age-related cataract, bilateral: Secondary | ICD-10-CM | POA: Diagnosis not present

## 2020-12-22 DIAGNOSIS — H524 Presbyopia: Secondary | ICD-10-CM | POA: Diagnosis not present

## 2020-12-22 LAB — HM DIABETES EYE EXAM

## 2020-12-25 ENCOUNTER — Ambulatory Visit (INDEPENDENT_AMBULATORY_CARE_PROVIDER_SITE_OTHER): Payer: Medicare HMO | Admitting: Family Medicine

## 2020-12-25 ENCOUNTER — Encounter: Payer: Self-pay | Admitting: Family Medicine

## 2020-12-25 VITALS — BP 124/72 | HR 106 | Ht 65.0 in | Wt 226.0 lb

## 2020-12-25 DIAGNOSIS — E1159 Type 2 diabetes mellitus with other circulatory complications: Secondary | ICD-10-CM

## 2020-12-25 DIAGNOSIS — N183 Chronic kidney disease, stage 3 unspecified: Secondary | ICD-10-CM

## 2020-12-25 DIAGNOSIS — I152 Hypertension secondary to endocrine disorders: Secondary | ICD-10-CM

## 2020-12-25 DIAGNOSIS — E785 Hyperlipidemia, unspecified: Secondary | ICD-10-CM | POA: Diagnosis not present

## 2020-12-25 DIAGNOSIS — E1169 Type 2 diabetes mellitus with other specified complication: Secondary | ICD-10-CM | POA: Diagnosis not present

## 2020-12-25 DIAGNOSIS — N1831 Chronic kidney disease, stage 3a: Secondary | ICD-10-CM

## 2020-12-25 DIAGNOSIS — E1122 Type 2 diabetes mellitus with diabetic chronic kidney disease: Secondary | ICD-10-CM | POA: Diagnosis not present

## 2020-12-25 LAB — CMP14+EGFR
ALT: 22 IU/L (ref 0–44)
AST: 25 IU/L (ref 0–40)
Albumin/Globulin Ratio: 1.5 (ref 1.2–2.2)
Albumin: 4 g/dL (ref 3.8–4.8)
Alkaline Phosphatase: 87 IU/L (ref 44–121)
BUN/Creatinine Ratio: 8 — ABNORMAL LOW (ref 10–24)
BUN: 13 mg/dL (ref 8–27)
Bilirubin Total: 0.7 mg/dL (ref 0.0–1.2)
CO2: 23 mmol/L (ref 20–29)
Calcium: 9.7 mg/dL (ref 8.6–10.2)
Chloride: 103 mmol/L (ref 96–106)
Creatinine, Ser: 1.56 mg/dL — ABNORMAL HIGH (ref 0.76–1.27)
Globulin, Total: 2.6 g/dL (ref 1.5–4.5)
Glucose: 170 mg/dL — ABNORMAL HIGH (ref 70–99)
Potassium: 4.2 mmol/L (ref 3.5–5.2)
Sodium: 140 mmol/L (ref 134–144)
Total Protein: 6.6 g/dL (ref 6.0–8.5)
eGFR: 48 mL/min/{1.73_m2} — ABNORMAL LOW (ref >59)

## 2020-12-25 LAB — CBC WITH DIFFERENTIAL/PLATELET
Basophils Absolute: 0 10*3/uL (ref 0.0–0.2)
Basos: 0 %
EOS (ABSOLUTE): 0.2 10*3/uL (ref 0.0–0.4)
Eos: 2 %
Hematocrit: 43.7 % (ref 37.5–51.0)
Hemoglobin: 15.1 g/dL (ref 13.0–17.7)
Immature Grans (Abs): 0 10*3/uL (ref 0.0–0.1)
Immature Granulocytes: 0 %
Lymphocytes Absolute: 3.1 10*3/uL (ref 0.7–3.1)
Lymphs: 33 %
MCH: 29.8 pg (ref 26.6–33.0)
MCHC: 34.6 g/dL (ref 31.5–35.7)
MCV: 86 fL (ref 79–97)
Monocytes Absolute: 0.8 10*3/uL (ref 0.1–0.9)
Monocytes: 8 %
Neutrophils Absolute: 5.4 10*3/uL (ref 1.4–7.0)
Neutrophils: 57 %
Platelets: 271 10*3/uL (ref 150–450)
RBC: 5.06 x10E6/uL (ref 4.14–5.80)
RDW: 11.9 % (ref 11.6–15.4)
WBC: 9.5 10*3/uL (ref 3.4–10.8)

## 2020-12-25 LAB — BAYER DCA HB A1C WAIVED: HB A1C (BAYER DCA - WAIVED): 7 % — ABNORMAL HIGH (ref 4.8–5.6)

## 2020-12-25 LAB — LIPID PANEL
Chol/HDL Ratio: 2.1 ratio (ref 0.0–5.0)
Cholesterol, Total: 88 mg/dL — ABNORMAL LOW (ref 100–199)
HDL: 41 mg/dL (ref >39)
LDL Chol Calc (NIH): 31 mg/dL (ref 0–99)
Triglycerides: 72 mg/dL (ref 0–149)
VLDL Cholesterol Cal: 16 mg/dL (ref 5–40)

## 2020-12-25 NOTE — Progress Notes (Signed)
BP 124/72    Pulse (!) 106    Ht $R'5\' 5"'BL$  (1.651 m)    Wt 226 lb (102.5 kg)    SpO2 96%    BMI 37.61 kg/m    Subjective:   Patient ID: Paul Williams, male    DOB: 1950/06/29, 70 y.o.   MRN: [id number]  HPI: Paul Williams is a 70 y.o. male presenting on 12/25/2020 for Medical Management of Chronic Issues, Diabetes, Hyperlipidemia, and Hypertension   HPI Type 2 diabetes mellitus Patient comes in today for recheck of his diabetes. Patient has been currently taking Januvia. Patient is currently on an ACE inhibitor/ARB. Patient has not seen an ophthalmologist this year. Patient denies any issues with their feet. The symptom started onset as an adult hypertension hyperlipidemia ARE RELATED TO DM   Hypertension Patient is currently on lisinopril hydrochlorothiazide, and their blood pressure today is 143/85. Patient denies any lightheadedness or dizziness. Patient denies headaches, blurred vision, chest pains, shortness of breath, or weakness. Denies any side effects from medication and is content with current medication.   Hyperlipidemia Patient is coming in for recheck of his hyperlipidemia. The patient is currently taking atorvastatin. They deny any issues with myalgias or history of liver damage from it. They deny any focal numbness or weakness or chest pain.   Relevant past medical, surgical, family and social history reviewed and updated as indicated. Interim medical history since our last visit reviewed. Allergies and medications reviewed and updated.  Review of Systems  Constitutional:  Negative for chills and fever.  Eyes:  Negative for visual disturbance.  Respiratory:  Negative for shortness of breath and wheezing.   Cardiovascular:  Negative for chest pain and leg swelling.  Musculoskeletal:  Negative for back pain and gait problem.  Skin:  Negative for rash.  Neurological:  Negative for dizziness and weakness.  All other systems reviewed and are negative.  Per HPI unless  specifically indicated above   Allergies as of 12/25/2020   No Known Allergies      Medication List        Accurate as of December 25, 2020  9:27 AM. If you have any questions, ask your nurse or doctor.          allopurinol 100 MG tablet Commonly known as: ZYLOPRIM Take 0.5 tablets (50 mg total) by mouth daily.   aspirin EC 81 MG tablet Take 81 mg by mouth daily.   atorvastatin 40 MG tablet Commonly known as: LIPITOR Take 1 tablet (40 mg total) by mouth daily.   fish oil-omega-3 fatty acids 1000 MG capsule Take 1 g by mouth daily.   lisinopril-hydrochlorothiazide 20-12.5 MG tablet Commonly known as: ZESTORETIC Take 1 tablet by mouth daily.   Mitigare 0.6 MG Caps Generic drug: Colchicine 2 tablets at pain onset, may repeat x1in 1 hour. No more then 3 tablets in 24 hours   multivitamin tablet Take 1 tablet by mouth daily.   OVER THE COUNTER MEDICATION Take 250 mg by mouth 2 (two) times daily. Super beta prostate   sildenafil 100 MG tablet Commonly known as: VIAGRA Take 0.5 tablets (50 mg total) by mouth as needed for erectile dysfunction.   sitaGLIPtin 100 MG tablet Commonly known as: Januvia Take 1 tablet (100 mg total) by mouth daily.   Vitamin D3 125 MCG (5000 UT) Caps Take 1 capsule by mouth daily.         Objective:   BP 124/72    Pulse (!) 106  Ht '5\' 5"'$  (1.651 m)    Wt 226 lb (102.5 kg)    SpO2 96%    BMI 37.61 kg/m   Wt Readings from Last 3 Encounters:  12/25/20 226 lb (102.5 kg)  10/27/20 230 lb (104.3 kg)  10/20/20 230 lb (104.3 kg)    Physical Exam Vitals and nursing note reviewed.  Constitutional:      General: He is not in acute distress.    Appearance: He is well-developed. He is not diaphoretic.  Eyes:     General: No scleral icterus.    Conjunctiva/sclera: Conjunctivae normal.  Neck:     Thyroid: No thyromegaly.  Cardiovascular:     Rate and Rhythm: Normal rate and regular rhythm.     Heart sounds: Normal heart sounds.  No murmur heard. Pulmonary:     Effort: Pulmonary effort is normal. No respiratory distress.     Breath sounds: Normal breath sounds. No wheezing.  Musculoskeletal:        General: Normal range of motion.     Cervical back: Neck supple.  Lymphadenopathy:     Cervical: No cervical adenopathy.  Skin:    General: Skin is warm and dry.     Findings: No rash.  Neurological:     Mental Status: He is alert and oriented to person, place, and time.     Coordination: Coordination normal.  Psychiatric:        Behavior: Behavior normal.      Assessment & Plan:   Problem List Items Addressed This Visit       Cardiovascular and Mediastinum   Hypertension associated with diabetes ([place])   Relevant Orders   CBC with Differential/Platelet   CMP14+EGFR   Lipid panel   HgB A1c     Endocrine   Hyperlipidemia associated with type 2 diabetes mellitus ([place]) - Primary   Relevant Orders   CBC with Differential/Platelet   CMP14+EGFR   Lipid panel   HgB A1c   Type 2 diabetes mellitus with diabetic chronic kidney disease (HCC)   Relevant Orders   CBC with Differential/Platelet   CMP14+EGFR   Lipid panel   HgB A1c   Bayer DCA Hb A1c Waived   Bayer DCA Hb A1c Waived     Genitourinary   Chronic kidney disease (CKD), stage III (moderate) (HCC)    A1c is 7.0, slightly elevated.  Focus on diet  Continue current medicine, no changes.  . Follow up plan: Return in about 3 months (around 03/25/2021), or if symptoms worsen or fail to improve, for Diabetes and hypertension and cholesterol.  Counseling provided for all of the vaccine components Orders Placed This Encounter  Procedures   CBC with Differential/Platelet   CMP14+EGFR   Lipid panel   Bayer DCA Hb A1c Waived   Bayer [place] Hb A1c Waived   HgB A1c    Caryl Pina, MD [facility] 12/25/2020, 9:27 AM

## 2021-01-12 DIAGNOSIS — H524 Presbyopia: Secondary | ICD-10-CM | POA: Diagnosis not present

## 2021-01-12 DIAGNOSIS — H5213 Myopia, bilateral: Secondary | ICD-10-CM | POA: Diagnosis not present

## 2021-01-12 DIAGNOSIS — H52209 Unspecified astigmatism, unspecified eye: Secondary | ICD-10-CM | POA: Diagnosis not present

## 2021-01-14 DIAGNOSIS — Z23 Encounter for immunization: Secondary | ICD-10-CM | POA: Diagnosis not present

## 2021-01-15 DIAGNOSIS — I129 Hypertensive chronic kidney disease with stage 1 through stage 4 chronic kidney disease, or unspecified chronic kidney disease: Secondary | ICD-10-CM | POA: Diagnosis not present

## 2021-01-15 DIAGNOSIS — R809 Proteinuria, unspecified: Secondary | ICD-10-CM | POA: Diagnosis not present

## 2021-01-15 DIAGNOSIS — N189 Chronic kidney disease, unspecified: Secondary | ICD-10-CM | POA: Diagnosis not present

## 2021-01-15 DIAGNOSIS — M109 Gout, unspecified: Secondary | ICD-10-CM | POA: Diagnosis not present

## 2021-01-15 DIAGNOSIS — E6609 Other obesity due to excess calories: Secondary | ICD-10-CM | POA: Diagnosis not present

## 2021-01-15 DIAGNOSIS — E1129 Type 2 diabetes mellitus with other diabetic kidney complication: Secondary | ICD-10-CM | POA: Diagnosis not present

## 2021-01-15 DIAGNOSIS — E1122 Type 2 diabetes mellitus with diabetic chronic kidney disease: Secondary | ICD-10-CM | POA: Diagnosis not present

## 2021-01-21 DIAGNOSIS — E1129 Type 2 diabetes mellitus with other diabetic kidney complication: Secondary | ICD-10-CM | POA: Diagnosis not present

## 2021-01-21 DIAGNOSIS — I129 Hypertensive chronic kidney disease with stage 1 through stage 4 chronic kidney disease, or unspecified chronic kidney disease: Secondary | ICD-10-CM | POA: Diagnosis not present

## 2021-01-21 DIAGNOSIS — E1122 Type 2 diabetes mellitus with diabetic chronic kidney disease: Secondary | ICD-10-CM | POA: Diagnosis not present

## 2021-01-21 DIAGNOSIS — R809 Proteinuria, unspecified: Secondary | ICD-10-CM | POA: Diagnosis not present

## 2021-01-21 DIAGNOSIS — E6609 Other obesity due to excess calories: Secondary | ICD-10-CM | POA: Diagnosis not present

## 2021-01-21 DIAGNOSIS — M109 Gout, unspecified: Secondary | ICD-10-CM | POA: Diagnosis not present

## 2021-01-21 DIAGNOSIS — N189 Chronic kidney disease, unspecified: Secondary | ICD-10-CM | POA: Diagnosis not present

## 2021-03-26 ENCOUNTER — Encounter: Payer: Self-pay | Admitting: Family Medicine

## 2021-03-26 ENCOUNTER — Other Ambulatory Visit: Payer: Self-pay | Admitting: Family Medicine

## 2021-03-26 ENCOUNTER — Ambulatory Visit (INDEPENDENT_AMBULATORY_CARE_PROVIDER_SITE_OTHER): Payer: Medicare HMO | Admitting: Family Medicine

## 2021-03-26 VITALS — BP 134/76 | HR 91 | Ht 65.0 in | Wt 231.0 lb

## 2021-03-26 DIAGNOSIS — N1831 Chronic kidney disease, stage 3a: Secondary | ICD-10-CM

## 2021-03-26 DIAGNOSIS — E1122 Type 2 diabetes mellitus with diabetic chronic kidney disease: Secondary | ICD-10-CM

## 2021-03-26 DIAGNOSIS — N183 Chronic kidney disease, stage 3 unspecified: Secondary | ICD-10-CM | POA: Diagnosis not present

## 2021-03-26 DIAGNOSIS — E785 Hyperlipidemia, unspecified: Secondary | ICD-10-CM | POA: Diagnosis not present

## 2021-03-26 DIAGNOSIS — I152 Hypertension secondary to endocrine disorders: Secondary | ICD-10-CM | POA: Diagnosis not present

## 2021-03-26 DIAGNOSIS — E1159 Type 2 diabetes mellitus with other circulatory complications: Secondary | ICD-10-CM | POA: Diagnosis not present

## 2021-03-26 DIAGNOSIS — E1169 Type 2 diabetes mellitus with other specified complication: Secondary | ICD-10-CM

## 2021-03-26 LAB — BAYER DCA HB A1C WAIVED: HB A1C (BAYER DCA - WAIVED): 7.1 % — ABNORMAL HIGH (ref 4.8–5.6)

## 2021-03-26 MED ORDER — FREESTYLE LITE W/DEVICE KIT: 1.0000 | PACK | Freq: Two times a day (BID) | 1 refills | Status: DC

## 2021-03-26 MED ORDER — BLOOD GLUCOSE TEST VI STRP: 1.0000 | ORAL_STRIP | Freq: Two times a day (BID) | 1 refills | Status: DC

## 2021-03-26 NOTE — Progress Notes (Signed)
? ?BP 134/76   Pulse 91   Ht $R'5\' 5"'TE$  (1.651 m)   Wt 231 lb (104.8 kg)   SpO2 98%   BMI 38.44 kg/m?   ? ?Subjective:  ? ?Patient ID: Paul Williams, male    DOB: [birth year], 71 y.o.   MRN: [id number] ? ?HPI: ?Paul Williams is a 71 y.o. male presenting on 03/26/2021 for Medical Management of Chronic Issues, Diabetes, Hyperlipidemia, and Chronic Kidney Disease ? ? ?HPI ?Type 2 diabetes mellitus ?Patient comes in today for recheck of his diabetes. Patient has been currently taking Januvia. Patient is currently on an ACE inhibitor/ARB. Patient has not seen an ophthalmologist this year. Patient denies any issues with their feet. The symptom started onset as an adult hypertension and hyperlipidemia ARE RELATED TO DM  ? ?Hypertension ?Patient is currently on lisinopril hydrochlorothiazide, and their blood pressure today is 134/76. Patient denies any lightheadedness or dizziness. Patient denies headaches, blurred vision, chest pains, shortness of breath, or weakness. Denies any side effects from medication and is content with current medication.  ? ?Hyperlipidemia ?Patient is coming in for recheck of his hyperlipidemia. The patient is currently taking atorvastatin and fish oil. They deny any issues with myalgias or history of liver damage from it. They deny any focal numbness or weakness or chest pain.  ? ?Relevant past medical, surgical, family and social history reviewed and updated as indicated. Interim medical history since our last visit reviewed. ?Allergies and medications reviewed and updated. ? ?Review of Systems  ?Constitutional:  Negative for chills and fever.  ?Eyes:  Negative for visual disturbance.  ?Respiratory:  Negative for shortness of breath and wheezing.   ?Cardiovascular:  Negative for chest pain and leg swelling.  ?Musculoskeletal:  Negative for back pain and gait problem.  ?Skin:  Negative for rash.  ?Neurological:  Negative for dizziness, weakness and light-headedness.  ?All other systems reviewed and are  negative. ? ?Per HPI unless specifically indicated above ? ? ?Allergies as of 03/26/2021   ?No Known Allergies ?  ? ?  ?Medication List  ?  ? ?  ? Accurate as of March 26, 2021  9:51 AM. If you have any questions, ask your nurse or doctor.  ?  ?  ? ?  ? ?allopurinol 100 MG tablet ?Commonly known as: ZYLOPRIM ?Take 0.5 tablets (50 mg total) by mouth daily. ?  ?aspirin EC 81 MG tablet ?Take 81 mg by mouth daily. ?  ?atorvastatin 40 MG tablet ?Commonly known as: LIPITOR ?Take 1 tablet (40 mg total) by mouth daily. ?  ?BLOOD GLUCOSE TEST STRIPS Strp ?1 strip by In Vitro route 2 (two) times daily. ?Started by: Worthy Rancher, MD ?  ?fish oil-omega-3 fatty acids 1000 MG capsule ?Take 1 g by mouth daily. ?  ?FreeStyle Lite w/Device Kit ?1 each by Does not apply route 2 (two) times daily. ?Started by: Worthy Rancher, MD ?  ?lisinopril-hydrochlorothiazide 20-12.5 MG tablet ?Commonly known as: ZESTORETIC ?Take 1 tablet by mouth daily. ?  ?Mitigare 0.6 MG Caps ?Generic drug: Colchicine ?2 tablets at pain onset, may repeat x1in 1 hour. No more then 3 tablets in 24 hours ?  ?multivitamin tablet ?Take 1 tablet by mouth daily. ?  ?OVER THE COUNTER MEDICATION ?Take 250 mg by mouth 2 (two) times daily. Super beta prostate ?  ?sildenafil 100 MG tablet ?Commonly known as: VIAGRA ?Take 0.5 tablets (50 mg total) by mouth as needed for erectile dysfunction. ?  ?sitaGLIPtin 100 MG tablet ?Commonly known as:  Januvia ?Take 1 tablet (100 mg total) by mouth daily. ?  ?Vitamin D3 125 MCG (5000 UT) Caps ?Take 1 capsule by mouth daily. ?  ? ?  ? ? ? ?Objective:  ? ?BP 134/76   Pulse 91   Ht $R'5\' 5"'Mz$  (1.651 m)   Wt 231 lb (104.8 kg)   SpO2 98%   BMI 38.44 kg/m?   ?Wt Readings from Last 3 Encounters:  ?03/26/21 231 lb (104.8 kg)  ?12/25/20 226 lb (102.5 kg)  ?10/27/20 230 lb (104.3 kg)  ?  ?Physical Exam ?Vitals and nursing note reviewed.  ?Constitutional:   ?   General: He is not in acute distress. ?   Appearance: He is well-developed.  He is not diaphoretic.  ?Eyes:  ?   General: No scleral icterus. ?   Conjunctiva/sclera: Conjunctivae normal.  ?Neck:  ?   Thyroid: No thyromegaly.  ?Cardiovascular:  ?   Rate and Rhythm: Normal rate and regular rhythm.  ?   Heart sounds: Normal heart sounds. No murmur heard. ?Pulmonary:  ?   Effort: Pulmonary effort is normal. No respiratory distress.  ?   Breath sounds: Normal breath sounds. No wheezing.  ?Musculoskeletal:     ?   General: No swelling. Normal range of motion.  ?   Cervical back: Neck supple.  ?Lymphadenopathy:  ?   Cervical: No cervical adenopathy.  ?Skin: ?   General: Skin is warm and dry.  ?   Findings: No rash.  ?Neurological:  ?   Mental Status: He is alert and oriented to person, place, and time.  ?   Coordination: Coordination normal.  ?Psychiatric:     ?   Behavior: Behavior normal.  ? ? ? ? ?Assessment & Plan:  ? ?Problem List Items Addressed This Visit   ? ?  ? Cardiovascular and Mediastinum  ? Hypertension associated with diabetes ([place])  ? Relevant Orders  ? CBC with Differential/Platelet  ? CMP14+EGFR  ? Lipid panel  ? Bayer DCA Hb A1c Waived  ?  ? Endocrine  ? Hyperlipidemia associated with type 2 diabetes mellitus ([place])  ? Relevant Orders  ? CBC with Differential/Platelet  ? CMP14+EGFR  ? Lipid panel  ? Bayer DCA Hb A1c Waived  ? Type 2 diabetes mellitus with diabetic chronic kidney disease ([place]) - Primary  ? Relevant Medications  ? Blood Glucose Monitoring Suppl (FREESTYLE LITE) w/Device KIT  ? Glucose Blood (BLOOD GLUCOSE TEST STRIPS) STRP  ? Other Relevant Orders  ? CBC with Differential/Platelet  ? CMP14+EGFR  ? Lipid panel  ? Bayer DCA Hb A1c Waived  ?  ? Genitourinary  ? Chronic kidney disease (CKD), stage III (moderate) (HCC)  ? Relevant Orders  ? CBC with Differential/Platelet  ? CMP14+EGFR  ? Lipid panel  ? Bayer DCA Hb A1c Waived  ?  ?Give prescription for glucometer and test strips ?A1c 7.1, only slightly high, looks good, focus on diet. ?Follow up plan: ?Return in about 3  months (around 06/26/2021), or if symptoms worsen or fail to improve, for Diabetes recheck. ? ?Counseling provided for all of the vaccine components ?Orders Placed This Encounter  ?Procedures  ? CBC with Differential/Platelet  ? CMP14+EGFR  ? Lipid panel  ? Bayer DCA Hb A1c Waived  ? ? ?Caryl Pina, MD ?[place] ?03/26/2021, 9:51 AM ? ? ? ? ?

## 2021-03-27 ENCOUNTER — Other Ambulatory Visit: Payer: Self-pay | Admitting: *Deleted

## 2021-03-27 LAB — LIPID PANEL
Chol/HDL Ratio: 2.2 ratio (ref 0.0–5.0)
Cholesterol, Total: 115 mg/dL (ref 100–199)
HDL: 52 mg/dL (ref >39)
LDL Chol Calc (NIH): 52 mg/dL (ref 0–99)
Triglycerides: 47 mg/dL (ref 0–149)
VLDL Cholesterol Cal: 11 mg/dL (ref 5–40)

## 2021-03-27 LAB — CMP14+EGFR
ALT: 29 IU/L (ref 0–44)
AST: 34 IU/L (ref 0–40)
Albumin/Globulin Ratio: 1.4 (ref 1.2–2.2)
Albumin: 4 g/dL (ref 3.8–4.8)
Alkaline Phosphatase: 85 IU/L (ref 44–121)
BUN/Creatinine Ratio: 9 — ABNORMAL LOW (ref 10–24)
BUN: 13 mg/dL (ref 8–27)
Bilirubin Total: 0.7 mg/dL (ref 0.0–1.2)
CO2: 25 mmol/L (ref 20–29)
Calcium: 9.8 mg/dL (ref 8.6–10.2)
Chloride: 103 mmol/L (ref 96–106)
Creatinine, Ser: 1.51 mg/dL — ABNORMAL HIGH (ref 0.76–1.27)
Globulin, Total: 2.9 g/dL (ref 1.5–4.5)
Glucose: 154 mg/dL — ABNORMAL HIGH (ref 70–99)
Potassium: 4.1 mmol/L (ref 3.5–5.2)
Sodium: 141 mmol/L (ref 134–144)
Total Protein: 6.9 g/dL (ref 6.0–8.5)
eGFR: 50 mL/min/{1.73_m2} — ABNORMAL LOW (ref >59)

## 2021-03-27 LAB — CBC WITH DIFFERENTIAL/PLATELET
Basophils Absolute: 0 10*3/uL (ref 0.0–0.2)
Basos: 0 %
EOS (ABSOLUTE): 0.2 10*3/uL (ref 0.0–0.4)
Eos: 2 %
Hematocrit: 44.9 % (ref 37.5–51.0)
Hemoglobin: 14.9 g/dL (ref 13.0–17.7)
Immature Grans (Abs): 0.1 10*3/uL (ref 0.0–0.1)
Immature Granulocytes: 1 %
Lymphocytes Absolute: 2.8 10*3/uL (ref 0.7–3.1)
Lymphs: 33 %
MCH: 29.4 pg (ref 26.6–33.0)
MCHC: 33.2 g/dL (ref 31.5–35.7)
MCV: 89 fL (ref 79–97)
Monocytes Absolute: 0.7 10*3/uL (ref 0.1–0.9)
Monocytes: 9 %
Neutrophils Absolute: 4.6 10*3/uL (ref 1.4–7.0)
Neutrophils: 55 %
Platelets: 250 10*3/uL (ref 150–450)
RBC: 5.07 x10E6/uL (ref 4.14–5.80)
RDW: 13.2 % (ref 11.6–15.4)
WBC: 8.4 10*3/uL (ref 3.4–10.8)

## 2021-03-27 MED ORDER — TRUEPLUS LANCETS 33G MISC: 3 refills | Status: DC

## 2021-03-27 MED ORDER — TRUE METRIX BLOOD GLUCOSE TEST VI STRP: ORAL_STRIP | 3 refills | Status: DC

## 2021-03-27 MED ORDER — TRUE METRIX AIR GLUCOSE METER W/DEVICE KIT: PACK | 0 refills | Status: DC

## 2021-03-27 MED ORDER — TRUE METRIX LEVEL 1 LOW VI SOLN: 2 refills | Status: AC

## 2021-03-27 MED ORDER — BD SWAB SINGLE USE REGULAR PADS: MEDICATED_PAD | 3 refills | Status: DC

## 2021-03-31 ENCOUNTER — Telehealth: Payer: Self-pay

## 2021-03-31 NOTE — Telephone Encounter (Signed)
(  Key: [id number]) FreeStyle Lite Kit ? ?Your information has been sent to [facility]. ? ? ? ?(Key: [id number]) FreeStyle Test Strips ? ?Your information has been sent to [facility]. ?

## 2021-04-13 DIAGNOSIS — M109 Gout, unspecified: Secondary | ICD-10-CM | POA: Diagnosis not present

## 2021-04-13 DIAGNOSIS — E1122 Type 2 diabetes mellitus with diabetic chronic kidney disease: Secondary | ICD-10-CM | POA: Diagnosis not present

## 2021-04-13 DIAGNOSIS — I129 Hypertensive chronic kidney disease with stage 1 through stage 4 chronic kidney disease, or unspecified chronic kidney disease: Secondary | ICD-10-CM | POA: Diagnosis not present

## 2021-04-13 DIAGNOSIS — E1129 Type 2 diabetes mellitus with other diabetic kidney complication: Secondary | ICD-10-CM | POA: Diagnosis not present

## 2021-04-13 DIAGNOSIS — E6609 Other obesity due to excess calories: Secondary | ICD-10-CM | POA: Diagnosis not present

## 2021-04-13 DIAGNOSIS — N189 Chronic kidney disease, unspecified: Secondary | ICD-10-CM | POA: Diagnosis not present

## 2021-04-13 DIAGNOSIS — R809 Proteinuria, unspecified: Secondary | ICD-10-CM | POA: Diagnosis not present

## 2021-04-14 NOTE — Telephone Encounter (Signed)
Key: [id number] - PA Case ID: [id number] - Rx #: [id number] Need help? Call us at [phone] ?Archivedon March 31 ?Outcome Approved on March 28 ?PA Case: [id number], Status: Approved, Coverage Starts on: 01/04/2021 12:00:00 AM, Coverage Ends on: 01/03/2022 12:00:00 AM. Questions? Contact [phone]. ?Drug FreeStyle Lite w/Device kit ?Form [employer] Electronic PA Form ?Original Claim Info ?[id number] ? ? (Key: [id number]) ? ?This request has been approved. ? ?

## 2021-05-02 DIAGNOSIS — E1122 Type 2 diabetes mellitus with diabetic chronic kidney disease: Secondary | ICD-10-CM | POA: Diagnosis not present

## 2021-05-02 DIAGNOSIS — R809 Proteinuria, unspecified: Secondary | ICD-10-CM | POA: Diagnosis not present

## 2021-05-02 DIAGNOSIS — I129 Hypertensive chronic kidney disease with stage 1 through stage 4 chronic kidney disease, or unspecified chronic kidney disease: Secondary | ICD-10-CM | POA: Diagnosis not present

## 2021-05-02 DIAGNOSIS — N189 Chronic kidney disease, unspecified: Secondary | ICD-10-CM | POA: Diagnosis not present

## 2021-05-02 DIAGNOSIS — E6609 Other obesity due to excess calories: Secondary | ICD-10-CM | POA: Diagnosis not present

## 2021-05-02 DIAGNOSIS — E1129 Type 2 diabetes mellitus with other diabetic kidney complication: Secondary | ICD-10-CM | POA: Diagnosis not present

## 2021-06-09 ENCOUNTER — Other Ambulatory Visit: Payer: Self-pay | Admitting: Family Medicine

## 2021-06-26 DIAGNOSIS — R809 Proteinuria, unspecified: Secondary | ICD-10-CM | POA: Diagnosis not present

## 2021-06-26 DIAGNOSIS — E1122 Type 2 diabetes mellitus with diabetic chronic kidney disease: Secondary | ICD-10-CM | POA: Diagnosis not present

## 2021-06-26 DIAGNOSIS — E1129 Type 2 diabetes mellitus with other diabetic kidney complication: Secondary | ICD-10-CM | POA: Diagnosis not present

## 2021-06-26 DIAGNOSIS — I129 Hypertensive chronic kidney disease with stage 1 through stage 4 chronic kidney disease, or unspecified chronic kidney disease: Secondary | ICD-10-CM | POA: Diagnosis not present

## 2021-06-26 DIAGNOSIS — N189 Chronic kidney disease, unspecified: Secondary | ICD-10-CM | POA: Diagnosis not present

## 2021-06-29 ENCOUNTER — Ambulatory Visit (INDEPENDENT_AMBULATORY_CARE_PROVIDER_SITE_OTHER): Payer: Medicare HMO | Admitting: Family Medicine

## 2021-06-29 ENCOUNTER — Encounter: Payer: Self-pay | Admitting: Family Medicine

## 2021-06-29 VITALS — BP 127/73 | HR 76 | Temp 97.9°F | Ht 65.0 in | Wt 226.0 lb

## 2021-06-29 DIAGNOSIS — E1159 Type 2 diabetes mellitus with other circulatory complications: Secondary | ICD-10-CM

## 2021-06-29 DIAGNOSIS — N1831 Chronic kidney disease, stage 3a: Secondary | ICD-10-CM | POA: Diagnosis not present

## 2021-06-29 DIAGNOSIS — E785 Hyperlipidemia, unspecified: Secondary | ICD-10-CM

## 2021-06-29 DIAGNOSIS — E1122 Type 2 diabetes mellitus with diabetic chronic kidney disease: Secondary | ICD-10-CM

## 2021-06-29 DIAGNOSIS — I152 Hypertension secondary to endocrine disorders: Secondary | ICD-10-CM

## 2021-06-29 DIAGNOSIS — N183 Chronic kidney disease, stage 3 unspecified: Secondary | ICD-10-CM

## 2021-06-29 DIAGNOSIS — E1169 Type 2 diabetes mellitus with other specified complication: Secondary | ICD-10-CM | POA: Diagnosis not present

## 2021-06-29 LAB — BAYER DCA HB A1C WAIVED: HB A1C (BAYER DCA - WAIVED): 6.2 % — ABNORMAL HIGH (ref 4.8–5.6)

## 2021-06-30 LAB — CBC WITH DIFFERENTIAL/PLATELET
Basophils Absolute: 0 10*3/uL (ref 0.0–0.2)
Basos: 0 %
EOS (ABSOLUTE): 0.2 10*3/uL (ref 0.0–0.4)
Eos: 4 %
Hematocrit: 42.5 % (ref 37.5–51.0)
Hemoglobin: 14.5 g/dL (ref 13.0–17.7)
Immature Grans (Abs): 0 10*3/uL (ref 0.0–0.1)
Immature Granulocytes: 0 %
Lymphocytes Absolute: 1.9 10*3/uL (ref 0.7–3.1)
Lymphs: 29 %
MCH: 30.8 pg (ref 26.6–33.0)
MCHC: 34.1 g/dL (ref 31.5–35.7)
MCV: 90 fL (ref 79–97)
Monocytes Absolute: 0.6 10*3/uL (ref 0.1–0.9)
Monocytes: 10 %
Neutrophils Absolute: 3.8 10*3/uL (ref 1.4–7.0)
Neutrophils: 57 %
Platelets: 227 10*3/uL (ref 150–450)
RBC: 4.71 x10E6/uL (ref 4.14–5.80)
RDW: 13.1 % (ref 11.6–15.4)
WBC: 6.6 10*3/uL (ref 3.4–10.8)

## 2021-06-30 LAB — CMP14+EGFR
ALT: 33 IU/L (ref 0–44)
AST: 39 IU/L (ref 0–40)
Albumin/Globulin Ratio: 1.5 (ref 1.2–2.2)
Albumin: 4 g/dL (ref 3.8–4.8)
Alkaline Phosphatase: 86 IU/L (ref 44–121)
BUN/Creatinine Ratio: 9 — ABNORMAL LOW (ref 10–24)
BUN: 14 mg/dL (ref 8–27)
Bilirubin Total: 0.7 mg/dL (ref 0.0–1.2)
CO2: 21 mmol/L (ref 20–29)
Calcium: 9.6 mg/dL (ref 8.6–10.2)
Chloride: 108 mmol/L — ABNORMAL HIGH (ref 96–106)
Creatinine, Ser: 1.53 mg/dL — ABNORMAL HIGH (ref 0.76–1.27)
Globulin, Total: 2.7 g/dL (ref 1.5–4.5)
Glucose: 163 mg/dL — ABNORMAL HIGH (ref 70–99)
Potassium: 4.3 mmol/L (ref 3.5–5.2)
Sodium: 142 mmol/L (ref 134–144)
Total Protein: 6.7 g/dL (ref 6.0–8.5)
eGFR: 49 mL/min/{1.73_m2} — ABNORMAL LOW (ref >59)

## 2021-06-30 LAB — LIPID PANEL
Chol/HDL Ratio: 1.9 ratio (ref 0.0–5.0)
Cholesterol, Total: 85 mg/dL — ABNORMAL LOW (ref 100–199)
HDL: 44 mg/dL (ref >39)
LDL Chol Calc (NIH): 27 mg/dL (ref 0–99)
Triglycerides: 56 mg/dL (ref 0–149)
VLDL Cholesterol Cal: 14 mg/dL (ref 5–40)

## 2021-07-03 ENCOUNTER — Ambulatory Visit: Payer: Medicare HMO

## 2021-07-09 DIAGNOSIS — Z6836 Body mass index (BMI) 36.0-36.9, adult: Secondary | ICD-10-CM | POA: Diagnosis not present

## 2021-07-09 DIAGNOSIS — E1122 Type 2 diabetes mellitus with diabetic chronic kidney disease: Secondary | ICD-10-CM | POA: Diagnosis not present

## 2021-07-09 DIAGNOSIS — E1129 Type 2 diabetes mellitus with other diabetic kidney complication: Secondary | ICD-10-CM | POA: Diagnosis not present

## 2021-07-09 DIAGNOSIS — I129 Hypertensive chronic kidney disease with stage 1 through stage 4 chronic kidney disease, or unspecified chronic kidney disease: Secondary | ICD-10-CM | POA: Diagnosis not present

## 2021-07-09 DIAGNOSIS — N189 Chronic kidney disease, unspecified: Secondary | ICD-10-CM | POA: Diagnosis not present

## 2021-07-09 DIAGNOSIS — R809 Proteinuria, unspecified: Secondary | ICD-10-CM | POA: Diagnosis not present

## 2021-07-16 ENCOUNTER — Ambulatory Visit (INDEPENDENT_AMBULATORY_CARE_PROVIDER_SITE_OTHER): Payer: Medicare HMO

## 2021-07-16 VITALS — Wt 226.0 lb

## 2021-07-16 DIAGNOSIS — Z0000 Encounter for general adult medical examination without abnormal findings: Secondary | ICD-10-CM | POA: Diagnosis not present

## 2021-07-16 NOTE — Patient Instructions (Signed)
Paul Williams , Thank you for taking time to come for your Medicare Wellness Visit. I appreciate your ongoing commitment to your health goals. Please review the following plan we discussed and let me know if I can assist you in the future.   Screening recommendations/referrals: Colonoscopy: Done 10/27/2020 - repeat 7 years Recommended yearly ophthalmology/optometry visit for glaucoma screening and checkup Recommended yearly dental visit for hygiene and checkup  Vaccinations: Influenza vaccine: Declined - adverse reaction Pneumococcal vaccine: [id number] Done 08/05/2016 - due for Pneumovax-23 Tdap vaccine: Done 09/02/2016 - Repeat in 10 years Shingles vaccine: Due - Shingrix is 2 doses 2-6 months apart and over 90% effective     Covid-19: Done 03/04/2020, 04/04/2020, 10/12/222 & 01/14/2021  Advanced directives: Advance directive discussed with you today. Even though you declined this today, please call our office should you change your mind, and we can give you the proper paperwork for you to fill out.   Conditions/risks identified: Aim for 30 minutes of exercise or brisk walking, 6-8 glasses of water, and 5 servings of fruits and vegetables each day.   Next appointment: Follow up in one year for your annual wellness visit.   Preventive Care 13 Years and Older, Male  Preventive care refers to lifestyle choices and visits with your health care provider that can promote health and wellness. What does preventive care include? A yearly physical exam. This is also called an annual well check. Dental exams once or twice a year. Routine eye exams. Ask your health care provider how often you should have your eyes checked. Personal lifestyle choices, including: Daily care of your teeth and gums. Regular physical activity. Eating a healthy diet. Avoiding tobacco and drug use. Limiting alcohol use. Practicing safe sex. Taking low doses of aspirin every day. Taking vitamin and mineral supplements as  recommended by your health care provider. What happens during an annual well check? The services and screenings done by your health care provider during your annual well check will depend on your age, overall health, lifestyle risk factors, and family history of disease. Counseling  Your health care provider may ask you questions about your: Alcohol use. Tobacco use. Drug use. Emotional well-being. Home and relationship well-being. Sexual activity. Eating habits. History of falls. Memory and ability to understand (cognition). Work and work Statistician. Screening  You may have the following tests or measurements: Height, weight, and BMI. Blood pressure. Lipid and cholesterol levels. These may be checked every 5 years, or more frequently if you are over 47 years old. Skin check. Lung cancer screening. You may have this screening every year starting at age 72 if you have a 30-pack-year history of smoking and currently smoke or have quit within the past 15 years. Fecal occult blood test (FOBT) of the stool. You may have this test every year starting at age 37. Flexible sigmoidoscopy or colonoscopy. You may have a sigmoidoscopy every 5 years or a colonoscopy every 10 years starting at age 43. Prostate cancer screening. Recommendations will vary depending on your family history and other risks. Hepatitis C blood test. Hepatitis B blood test. Sexually transmitted disease (STD) testing. Diabetes screening. This is done by checking your blood sugar (glucose) after you have not eaten for a while (fasting). You may have this done every 1-3 years. Abdominal aortic aneurysm (AAA) screening. You may need this if you are a current or former smoker. Osteoporosis. You may be screened starting at age 80 if you are at high risk. Talk with your health  care provider about your test results, treatment options, and if necessary, the need for more tests. Vaccines  Your health care provider may recommend  certain vaccines, such as: Influenza vaccine. This is recommended every year. Tetanus, diphtheria, and acellular pertussis (Tdap, Td) vaccine. You may need a Td booster every 10 years. Zoster vaccine. You may need this after age 5. Pneumococcal 13-valent conjugate (PCV13) vaccine. One dose is recommended after age 54. Pneumococcal polysaccharide (PPSV23) vaccine. One dose is recommended after age 40. Talk to your health care provider about which screenings and vaccines you need and how often you need them. This information is not intended to replace advice given to you by your health care provider. Make sure you discuss any questions you have with your health care provider. Document Released: 01/17/2015 Document Revised: 09/10/2015 Document Reviewed: 10/22/2014 Elsevier Interactive Patient Education  2017 [employer] Prevention in the Home Falls can cause injuries. They can happen to people of all ages. There are many things you can do to make your home safe and to help prevent falls. What can I do on the outside of my home? Regularly fix the edges of walkways and driveways and fix any cracks. Remove anything that might make you trip as you walk through a door, such as a raised step or threshold. Trim any bushes or trees on the path to your home. Use bright outdoor lighting. Clear any walking paths of anything that might make someone trip, such as rocks or tools. Regularly check to see if handrails are loose or broken. Make sure that both sides of any steps have handrails. Any raised decks and porches should have guardrails on the edges. Have any leaves, snow, or ice cleared regularly. Use sand or salt on walking paths during winter. Clean up any spills in your garage right away. This includes oil or grease spills. What can I do in the bathroom? Use night lights. Install grab bars by the toilet and in the tub and shower. Do not use towel bars as grab bars. Use non-skid mats or  decals in the tub or shower. If you need to sit down in the shower, use a plastic, non-slip stool. Keep the floor dry. Clean up any water that spills on the floor as soon as it happens. Remove soap buildup in the tub or shower regularly. Attach bath mats securely with double-sided non-slip rug tape. Do not have throw rugs and other things on the floor that can make you trip. What can I do in the bedroom? Use night lights. Make sure that you have a light by your bed that is easy to reach. Do not use any sheets or blankets that are too big for your bed. They should not hang down onto the floor. Have a firm chair that has side arms. You can use this for support while you get dressed. Do not have throw rugs and other things on the floor that can make you trip. What can I do in the kitchen? Clean up any spills right away. Avoid walking on wet floors. Keep items that you use a lot in easy-to-reach places. If you need to reach something above you, use a strong step stool that has a grab bar. Keep electrical cords out of the way. Do not use floor polish or wax that makes floors slippery. If you must use wax, use non-skid floor wax. Do not have throw rugs and other things on the floor that can make you trip. What can  I do with my stairs? Do not leave any items on the stairs. Make sure that there are handrails on both sides of the stairs and use them. Fix handrails that are broken or loose. Make sure that handrails are as long as the stairways. Check any carpeting to make sure that it is firmly attached to the stairs. Fix any carpet that is loose or worn. Avoid having throw rugs at the top or bottom of the stairs. If you do have throw rugs, attach them to the floor with carpet tape. Make sure that you have a light switch at the top of the stairs and the bottom of the stairs. If you do not have them, ask someone to add them for you. What else can I do to help prevent falls? Wear shoes that: Do not  have high heels. Have rubber bottoms. Are comfortable and fit you well. Are closed at the toe. Do not wear sandals. If you use a stepladder: Make sure that it is fully opened. Do not climb a closed stepladder. Make sure that both sides of the stepladder are locked into place. Ask someone to hold it for you, if possible. Clearly mark and make sure that you can see: Any grab bars or handrails. First and last steps. Where the edge of each step is. Use tools that help you move around (mobility aids) if they are needed. These include: Canes. Walkers. Scooters. Crutches. Turn on the lights when you go into a dark area. Replace any light bulbs as soon as they burn out. Set up your furniture so you have a clear path. Avoid moving your furniture around. If any of your floors are uneven, fix them. If there are any pets around you, be aware of where they are. Review your medicines with your doctor. Some medicines can make you feel dizzy. This can increase your chance of falling. Ask your doctor what other things that you can do to help prevent falls. This information is not intended to replace advice given to you by your health care provider. Make sure you discuss any questions you have with your health care provider. Document Released: 10/17/2008 Document Revised: 05/29/2015 Document Reviewed: 01/25/2014 Elsevier Interactive Patient Education  2017 [employer].

## 2021-07-16 NOTE — Progress Notes (Signed)
Subjective:   Kunta Hilleary is a 71 y.o. male who presents for Medicare Annual/Subsequent preventive examination.  Virtual Visit via Telephone Note  I connected with  Reubin Flewellen on 07/16/21 at  2:45 PM EDT by telephone and verified that I am speaking with the correct person using two identifiers.  Location: Patient: Home Provider: [facility] Persons participating in the virtual visit: patient/Nurse Health Advisor   I discussed the limitations, risks, security and privacy concerns of performing an evaluation and management service by telephone and the availability of in person appointments. The patient expressed understanding and agreed to proceed.  Interactive audio and video telecommunications were attempted between this nurse and patient, however failed, due to patient having technical difficulties OR patient did not have access to video capability.  We continued and completed visit with audio only.  Some vital signs may be absent or patient reported.   Janella Rogala E Teresea Donley, LPN   Review of Systems     Cardiac Risk Factors include: advanced age (>10mn, >>71women);diabetes mellitus;dyslipidemia;hypertension;male gender;obesity (BMI >30kg/m2)     Objective:    Today's Vitals   07/16/21 1454  Weight: 226 lb (102.5 kg)   Body mass index is 37.61 kg/m.     07/16/2021    3:08 PM 07/02/2020    9:09 AM 06/26/2019    9:30 AM 06/22/2018   10:12 AM  Advanced Directives  Does Patient Have a Medical Advance Directive? No No No No  Would patient like information on creating a medical advance directive? No - Patient declined No - Patient declined No - Patient declined     Current Medications (verified) Outpatient Encounter Medications as of 07/16/2021  Medication Sig   Alcohol Swabs (B-D SINGLE USE SWABS REGULAR) PADS Test BS BID Dx E11.22   allopurinol (ZYLOPRIM) 100 MG tablet Take 0.5 tablets (50 mg total) by mouth daily.   aspirin EC 81 MG tablet Take 81 mg by mouth daily.   atorvastatin  (LIPITOR) 40 MG tablet Take 1 tablet (40 mg total) by mouth daily.   Blood Glucose Calibration (TRUE METRIX LEVEL 1) Low SOLN Use w/ glucose monitor Dx E11.22   Blood Glucose Monitoring Suppl (TRUE METRIX AIR GLUCOSE METER) w/Device KIT Test BS BID Dx E11.22   fish oil-omega-3 fatty acids 1000 MG capsule Take 1 g by mouth daily.   glucose blood (TRUE METRIX BLOOD GLUCOSE TEST) test strip Test BS BID Dx E11.22   labetalol (NORMODYNE) 200 MG tablet Take 200 mg by mouth 2 (two) times daily.   MITIGARE 0.6 MG CAPS 2 tablets at pain onset, may repeat x1in 1 hour. No more then 3 tablets in 24 hours   Multiple Vitamin (MULTIVITAMIN) tablet Take 1 tablet by mouth daily.   OVER THE COUNTER MEDICATION Take 250 mg by mouth 2 (two) times daily. Super beta prostate   sildenafil (VIAGRA) 100 MG tablet Take 0.5 tablets (50 mg total) by mouth as needed for erectile dysfunction.   sitaGLIPtin (JANUVIA) 100 MG tablet Take 1 tablet (100 mg total) by mouth daily.   telmisartan (MICARDIS) 20 MG tablet Take 20 mg by mouth daily.   TRUEplus Lancets 33G MISC Test BS BID Dx E11.22   No facility-administered encounter medications on file as of 07/16/2021.    Allergies (verified) Patient has no known allergies.   History: Past Medical History:  Diagnosis Date   Adenomatous polyp of colon    Diabetes mellitus without complication ([place]    Diverticulosis    Hyperlipidemia  Hypertension    Internal hemorrhoids    Past Surgical History:  Procedure Laterality Date   COLONOSCOPY     POLYPECTOMY     top plate of teeth extracted     Family History  Problem Relation Age of Onset   Stroke Mother    Diabetes Father    Prostate cancer Father        prostate   Diabetes Sister    Diabetes Sister    Colon cancer Neg Hx    Esophageal cancer Neg Hx    Rectal cancer Neg Hx    Stomach cancer Neg Hx    Colon polyps Neg Hx    Social History   Socioeconomic History   Marital status: Married    Spouse name:  Arlene   Number of children: 1   Years of education: Not on file   Highest education level: Not on file  Occupational History   Occupation: retired    Fish farm manager: [employer].  Tobacco Use   Smoking status: Never   Smokeless tobacco: Never  Vaping Use   Vaping Use: Never used  Substance and Sexual Activity   Alcohol use: Never   Drug use: No   Sexual activity: Yes    Partners: Female  Other Topics Concern   Not on file  Social History Narrative   Lives home with wife. They have one daughter in [place].   Social Determinants of Health   Financial Resource Strain: Low Risk  (07/16/2021)   Overall Financial Resource Strain (CARDIA)    Difficulty of Paying Living Expenses: Not hard at all  Food Insecurity: No Food Insecurity (07/16/2021)   Hunger Vital Sign    Worried About Running Out of Food in the Last Year: Never true    Ran Out of Food in the Last Year: Never true  Transportation Needs: No Transportation Needs (07/16/2021)   PRAPARE - Hydrologist (Medical): No    Lack of Transportation (Non-Medical): No  Physical Activity: Sufficiently Active (07/16/2021)   Exercise Vital Sign    Days of Exercise per Week: 7 days    Minutes of Exercise per Session: 30 min  Stress: No Stress Concern Present (07/16/2021)   [place]    Feeling of Stress : Not at all  Social Connections: [place] (07/16/2021)   Social Connection and Isolation Panel [NHANES]    Frequency of Communication with Friends and Family: More than three times a week    Frequency of Social Gatherings with Friends and Family: More than three times a week    Attends Religious Services: More than 4 times per year    Active Member of [employer] or Organizations: Yes    Attends Music therapist: More than 4 times per year    Marital Status: Married    Tobacco Counseling Counseling given: Not  Answered   Clinical Intake:  Pre-visit preparation completed: Yes  Pain : No/denies pain     BMI - recorded: 37.61 Nutritional Status: BMI > 30  Obese Nutritional Risks: None Diabetes: Yes CBG done?: No Did pt. bring in CBG monitor from home?: No  How often do you need to have someone help you when you read instructions, pamphlets, or other written materials from your doctor or pharmacy?: 1 - Never  Diabetic? Nutrition Risk Assessment:  Has the patient had any N/V/D within the last 2 months?  No  Does the patient have any  non-healing wounds?  No  Has the patient had any unintentional weight loss or weight gain?  No   Diabetes:  Is the patient diabetic?  Yes  If diabetic, was a CBG obtained today?  No  Did the patient bring in their glucometer from home?  No  How often do you monitor your CBG's? Once daily fasting - 100 this am per patient.   Financial Strains and Diabetes Management:  Are you having any financial strains with the device, your supplies or your medication? No .  Does the patient want to be seen by Chronic Care Management for management of their diabetes?  No  Would the patient like to be referred to a Nutritionist or for Diabetic Management?  No   Diabetic Exams:  Diabetic Eye Exam: Completed 12/22/2020.   Diabetic Foot Exam: Completed 03/26/2021. Pt has been advised about the importance in completing this exam.  Interpreter Needed?: No  Information entered by :: Odell Choung, LPN   Activities of Daily Living    07/16/2021    3:01 PM  In your present state of health, do you have any difficulty performing the following activities:  Hearing? 0  Vision? 0  Difficulty concentrating or making decisions? 0  Walking or climbing stairs? 0  Dressing or bathing? 0  Doing errands, shopping? 0  Preparing Food and eating ? N  Using the Toilet? N  In the past six months, have you accidently leaked urine? N  Do you have problems with loss of bowel control?  N  Managing your Medications? N  Managing your Finances? N  Housekeeping or managing your Housekeeping? N    Patient Care Team: Dettinger, Fransisca Kaufmann, MD as PCP - General (Family Medicine) Liana Gerold, MD as Consulting Physician (Nephrology) [place], [place] any recent Medical Services you may have received from other than [facility] providers in the past year (date may be approximate).     Assessment:   This is a routine wellness examination for Zabian.  Hearing/Vision screen Hearing Screening - Comments:: Denies hearing difficulties   Vision Screening - Comments:: Wears rx glasses - up to date with routine eye exams with [facility]  Dietary issues and exercise activities discussed: Current Exercise Habits: Home exercise routine, Type of exercise: walking;treadmill;strength training/weights, Time (Minutes): 30, Frequency (Times/Week): 6, Weekly Exercise (Minutes/Week): 180, Intensity: Mild, Exercise limited by: None identified   Goals Addressed             This Visit's Progress    DIET - EAT MORE FRUITS AND VEGETABLES   On track    Exercise 150 min/wk Moderate Activity   On track      Depression Screen    07/16/2021    3:00 PM 06/29/2021    9:13 AM 03/26/2021    9:05 AM 12/25/2020    8:43 AM 09/25/2020    9:27 AM 07/02/2020    9:05 AM 06/16/2020    9:23 AM  PHQ 2/9 Scores  PHQ - 2 Score 0 0 0 0 0 0 0  PHQ- 9 Score 0          Fall Risk    07/16/2021    2:56 PM 06/29/2021    9:13 AM 03/26/2021    9:05 AM 12/25/2020    8:42 AM 09/25/2020    9:27 AM  Fall Risk   Falls in the past year? 0 0 0 0 0  Number falls in past yr: 0      Injury  with Fall? 0      Risk for fall due to : No Fall Risks      Follow up Falls prevention discussed        [place]:  Any stairs in or around the home? No  If so, are there any without handrails? No  Home free of loose throw rugs in walkways, pet beds, electrical cords, etc? Yes  Adequate  lighting in your home to reduce risk of falls? Yes   ASSISTIVE DEVICES UTILIZED TO PREVENT FALLS:  Life alert? No  Use of a cane, walker or w/c? No  Grab bars in the bathroom? No  Shower chair or bench in shower? Yes  Elevated toilet seat or a handicapped toilet? Yes   TIMED UP AND GO:  Was the test performed? No . Telephonic visit  Cognitive Function:    06/22/2018   10:15 AM  MMSE - Mini Mental State Exam  Orientation to time 5  Orientation to Place 5  Registration 3  Attention/ Calculation 5  Recall 3  Language- name 2 objects 2  Language- repeat 1  Language- follow 3 step command 1  Language- read & follow direction 1  Write a sentence 1  Copy design 1  Total score 28        07/16/2021    3:01 PM 06/26/2019    9:27 AM  6CIT Screen  What Year? 0 points 0 points  What month? 0 points 0 points  What time? 0 points 0 points  Count back from 20 0 points 0 points  Months in reverse 0 points 0 points  Repeat phrase 0 points 2 points  Total Score 0 points 2 points    Immunizations Immunization History  Administered Date(s) Administered   Moderna Covid-19 Vaccine Bivalent Booster 30yr & up 01/14/2021   Moderna Sars-Covid-2 Vaccination 03/04/2020, 04/04/2020   Pneumococcal Conjugate-13 08/05/2016   Tdap 09/02/2016    TDAP status: Due, Education has been provided regarding the importance of this vaccine. Advised may receive this vaccine at local pharmacy or Health Dept. Aware to provide a copy of the vaccination record if obtained from local pharmacy or Health Dept. Verbalized acceptance and understanding.  Flu Vaccine status: Due, Education has been provided regarding the importance of this vaccine. Advised may receive this vaccine at local pharmacy or Health Dept. Aware to provide a copy of the vaccination record if obtained from local pharmacy or Health Dept. Verbalized acceptance and understanding.  Pneumococcal vaccine status: Due, Education has been provided  regarding the importance of this vaccine. Advised may receive this vaccine at local pharmacy or Health Dept. Aware to provide a copy of the vaccination record if obtained from local pharmacy or Health Dept. Verbalized acceptance and understanding.  Covid-19 vaccine status: Completed vaccines  Qualifies for Shingles Vaccine? Yes   Zostavax completed No   Shingrix Completed?: No.    Education has been provided regarding the importance of this vaccine. Patient has been advised to call insurance company to determine out of pocket expense if they have not yet received this vaccine. Advised may also receive vaccine at local pharmacy or Health Dept. Verbalized acceptance and understanding.  Screening Tests Health Maintenance  Topic Date Due   Zoster Vaccines- Shingrix (1 of 2) 09/16/2021 (Originally 06/27/2002)   Pneumonia Vaccine 69 Years old (2 - PPSV23 or PCV20) 12/25/2021 (Originally 08/05/2017)   INFLUENZA VACCINE  08/04/2021   OPHTHALMOLOGY EXAM  12/22/2021   HEMOGLOBIN A1C  12/29/2021   FOOT EXAM  03/27/2022   TETANUS/TDAP  09/03/2026   COLONOSCOPY (Pts 45-32yr Insurance coverage will need to be confirmed)  10/28/2027   Hepatitis C Screening  Completed   HPV VACCINES  Aged Out   COVID-19 Vaccine  Discontinued    Health Maintenance  There are no preventive care reminders to display for this patient.  Colorectal cancer screening: Type of screening: Colonoscopy. Completed 10/27/2020. Repeat every 7 years  Lung Cancer Screening: (Low Dose CT Chest recommended if Age 69-80years, 30 pack-year currently smoking OR have quit w/in 15years.) does not qualify.   Additional Screening:  Hepatitis C Screening: does qualify; Completed 08/05/2016  Vision Screening: Recommended annual ophthalmology exams for early detection of glaucoma and other disorders of the eye. Is the patient up to date with their annual eye exam?  Yes  Who is the provider or what is the name of the office in which the  patient attends annual eye exams? [facility] pt is not established with a provider, would they like to be referred to a provider to establish care? No .   Dental Screening: Recommended annual dental exams for proper oral hygiene  Community Resource Referral / Chronic Care Management: CRR required this visit?  No   CCM required this visit?  No      Plan:     I have personally reviewed and noted the following in the patient's chart:   Medical and social history Use of alcohol, tobacco or illicit drugs  Current medications and supplements including opioid prescriptions. Patient is not currently taking opioid prescriptions. Functional ability and status Nutritional status Physical activity Advanced directives List of other physicians Hospitalizations, surgeries, and ER visits in previous 12 months Vitals Screenings to include cognitive, depression, and falls Referrals and appointments  In addition, I have reviewed and discussed with patient certain preventive protocols, quality metrics, and best practice recommendations. A written personalized care plan for preventive services as well as general preventive health recommendations were provided to patient.     ASandrea Hammond LPN   [id number]  Nurse Notes: None

## 2021-09-30 ENCOUNTER — Ambulatory Visit (INDEPENDENT_AMBULATORY_CARE_PROVIDER_SITE_OTHER): Payer: Medicare HMO | Admitting: Family Medicine

## 2021-09-30 ENCOUNTER — Encounter: Payer: Self-pay | Admitting: Family Medicine

## 2021-09-30 VITALS — BP 131/73 | HR 71 | Temp 97.1°F | Ht 65.0 in | Wt 234.0 lb

## 2021-09-30 DIAGNOSIS — E1122 Type 2 diabetes mellitus with diabetic chronic kidney disease: Secondary | ICD-10-CM | POA: Diagnosis not present

## 2021-09-30 DIAGNOSIS — M1A0720 Idiopathic chronic gout, left ankle and foot, without tophus (tophi): Secondary | ICD-10-CM | POA: Diagnosis not present

## 2021-09-30 DIAGNOSIS — I152 Hypertension secondary to endocrine disorders: Secondary | ICD-10-CM | POA: Diagnosis not present

## 2021-09-30 DIAGNOSIS — N183 Chronic kidney disease, stage 3 unspecified: Secondary | ICD-10-CM

## 2021-09-30 DIAGNOSIS — E785 Hyperlipidemia, unspecified: Secondary | ICD-10-CM

## 2021-09-30 DIAGNOSIS — N1831 Chronic kidney disease, stage 3a: Secondary | ICD-10-CM

## 2021-09-30 DIAGNOSIS — Z23 Encounter for immunization: Secondary | ICD-10-CM

## 2021-09-30 DIAGNOSIS — E1169 Type 2 diabetes mellitus with other specified complication: Secondary | ICD-10-CM

## 2021-09-30 DIAGNOSIS — E1159 Type 2 diabetes mellitus with other circulatory complications: Secondary | ICD-10-CM

## 2021-09-30 LAB — BAYER DCA HB A1C WAIVED: HB A1C (BAYER DCA - WAIVED): 6.5 % — ABNORMAL HIGH (ref 4.8–5.6)

## 2021-09-30 MED ORDER — ALLOPURINOL 100 MG PO TABS: 50.0000 mg | ORAL_TABLET | Freq: Every day | ORAL | 3 refills | Status: DC

## 2021-09-30 MED ORDER — MITIGARE 0.6 MG PO CAPS: ORAL_CAPSULE | ORAL | 3 refills | Status: DC

## 2021-09-30 MED ORDER — ATORVASTATIN CALCIUM 40 MG PO TABS: 40.0000 mg | ORAL_TABLET | Freq: Every day | ORAL | 3 refills | Status: DC

## 2021-09-30 MED ORDER — SITAGLIPTIN PHOSPHATE 100 MG PO TABS
100.0000 mg | ORAL_TABLET | Freq: Every day | ORAL | 3 refills | Status: DC
Start: 2021-09-30 — End: 2022-07-01

## 2021-09-30 NOTE — Progress Notes (Signed)
BP 131/73   Pulse 71   Temp (!) 97.1 F (36.2 C)   Ht 5' 5" (1.651 m)   Wt 234 lb (106.1 kg)   SpO2 97%   BMI 38.94 kg/m    Subjective:   Patient ID: Paul Williams, male    DOB: July 17, 1950, 71 y.o.   MRN: [id number]  HPI: Paul Williams is a 71 y.o. male presenting on 09/30/2021 for Medical Management of Chronic Issues, Hyperlipidemia, Diabetes, and Chronic Kidney Disease   HPI Type 2 diabetes mellitus Patient comes in today for recheck of his diabetes. Patient has been currently taking Januvia. Patient is currently on an ACE inhibitor/ARB. Patient has seen an ophthalmologist this year. Patient denies any issues with their feet. The symptom started onset as an adult hypertension and hyperlipidemia ARE RELATED TO DM   Hypertension Patient is currently on telmisartan and labetalol, and their blood pressure today is 131/73. Patient denies any lightheadedness or dizziness. Patient denies headaches, blurred vision, chest pains, shortness of breath, or weakness. Denies any side effects from medication and is content with current medication.   Hyperlipidemia Patient is coming in for recheck of his hyperlipidemia. The patient is currently taking atorvastatin and fish oil. They deny any issues with myalgias or history of liver damage from it. They deny any focal numbness or weakness or chest pain.   Gout Last attack: Over a year ago Attacks this year: None Medication: Allopurinol and colchicine as needed Location of attacks: Ankles and feet  Relevant past medical, surgical, family and social history reviewed and updated as indicated. Interim medical history since our last visit reviewed. Allergies and medications reviewed and updated.  Review of Systems  Constitutional:  Negative for chills and fever.  Eyes:  Negative for visual disturbance.  Respiratory:  Negative for shortness of breath and wheezing.   Cardiovascular:  Negative for chest pain and leg swelling.  Musculoskeletal:   Negative for back pain and gait problem.  Skin:  Negative for rash.  Neurological:  Negative for dizziness, weakness and light-headedness.  All other systems reviewed and are negative.   Per HPI unless specifically indicated above   Allergies as of 09/30/2021   No Known Allergies      Medication List        Accurate as of September 30, 2021 11:03 AM. If you have any questions, ask your nurse or doctor.          allopurinol 100 MG tablet Commonly known as: ZYLOPRIM Take 0.5 tablets (50 mg total) by mouth daily.   aspirin EC 81 MG tablet Take 81 mg by mouth daily.   atorvastatin 40 MG tablet Commonly known as: LIPITOR Take 1 tablet (40 mg total) by mouth daily.   B-D SINGLE USE SWABS REGULAR Pads Test BS BID Dx E11.22   fish oil-omega-3 fatty acids 1000 MG capsule Take 1 g by mouth daily.   labetalol 200 MG tablet Commonly known as: NORMODYNE Take 200 mg by mouth 2 (two) times daily.   Mitigare 0.6 MG Caps Generic drug: Colchicine 2 tablets at pain onset, may repeat x1in 1 hour. No more then 3 tablets in 24 hours   multivitamin tablet Take 1 tablet by mouth daily.   OVER THE COUNTER MEDICATION Take 250 mg by mouth 2 (two) times daily. Super beta prostate   sildenafil 100 MG tablet Commonly known as: VIAGRA Take 0.5 tablets (50 mg total) by mouth as needed for erectile dysfunction.   sitaGLIPtin 100 MG tablet Commonly  known as: Januvia Take 1 tablet (100 mg total) by mouth daily.   telmisartan 20 MG tablet Commonly known as: MICARDIS Take 20 mg by mouth daily.   True Metrix Air Glucose Meter w/Device Kit Test BS BID Dx E11.22   True Metrix Blood Glucose Test test strip Generic drug: glucose blood Test BS BID Dx E11.22   True Metrix Level 1 Low Soln Use w/ glucose monitor Dx E11.22   TRUEplus Lancets 33G Misc Test BS BID Dx E11.22         Objective:   BP 131/73   Pulse 71   Temp (!) 97.1 F (36.2 C)   Ht 5' 5" (1.651 m)   Wt 234  lb (106.1 kg)   SpO2 97%   BMI 38.94 kg/m   Wt Readings from Last 3 Encounters:  09/30/21 234 lb (106.1 kg)  07/16/21 226 lb (102.5 kg)  06/29/21 226 lb (102.5 kg)    Physical Exam Vitals and nursing note reviewed.  Constitutional:      General: He is not in acute distress.    Appearance: He is well-developed. He is not diaphoretic.  Eyes:     General: No scleral icterus.    Conjunctiva/sclera: Conjunctivae normal.  Neck:     Thyroid: No thyromegaly.  Cardiovascular:     Rate and Rhythm: Normal rate and regular rhythm.     Heart sounds: Normal heart sounds. No murmur heard. Pulmonary:     Effort: Pulmonary effort is normal. No respiratory distress.     Breath sounds: Normal breath sounds. No wheezing.  Musculoskeletal:        General: Normal range of motion.     Cervical back: Neck supple.  Lymphadenopathy:     Cervical: No cervical adenopathy.  Skin:    General: Skin is warm and dry.     Findings: No rash.  Neurological:     Mental Status: He is alert and oriented to person, place, and time.     Coordination: Coordination normal.  Psychiatric:        Behavior: Behavior normal.       Assessment & Plan:   Problem List Items Addressed This Visit       Cardiovascular and Mediastinum   Hypertension associated with diabetes ([place])   Relevant Medications   atorvastatin (LIPITOR) 40 MG tablet   sitaGLIPtin (JANUVIA) 100 MG tablet   Other Relevant Orders   CBC with Differential/Platelet   CMP14+EGFR   Lipid panel   Bayer DCA Hb A1c Waived     Endocrine   Hyperlipidemia associated with type 2 diabetes mellitus (HCC) - Primary   Relevant Medications   atorvastatin (LIPITOR) 40 MG tablet   sitaGLIPtin (JANUVIA) 100 MG tablet   Other Relevant Orders   CBC with Differential/Platelet   CMP14+EGFR   Lipid panel   Bayer DCA Hb A1c Waived   Type 2 diabetes mellitus with diabetic chronic kidney disease (HCC)   Relevant Medications   atorvastatin (LIPITOR) 40 MG  tablet   sitaGLIPtin (JANUVIA) 100 MG tablet   Other Relevant Orders   CBC with Differential/Platelet   CMP14+EGFR   Lipid panel   Bayer DCA Hb A1c Waived     Genitourinary   Chronic kidney disease (CKD), stage III (moderate) (HCC)   Other Visit Diagnoses     Idiopathic chronic gout of left ankle without tophus       Relevant Medications   allopurinol (ZYLOPRIM) 100 MG tablet   MITIGARE 0.6 MG CAPS  A1c is good at 6.5, he has gained some weight, going to focus on getting back to the gym blood sugars look good.  No change in medication. Follow up plan: Return in about 3 months (around 12/30/2021), or if symptoms worsen or fail to improve, for Diabetes hypertension cholesterol.  Counseling provided for all of the vaccine components Orders Placed This Encounter  Procedures   CBC with Differential/Platelet   CMP14+EGFR   Lipid panel   Bayer DCA Hb A1c [place], MD [facility] 09/30/2021, 11:03 AM

## 2021-09-30 NOTE — Addendum Note (Signed)
Addended by: Alphonzo Dublin on: 09/30/2021 05:10 PM   Modules accepted: Orders

## 2021-10-01 LAB — CBC WITH DIFFERENTIAL/PLATELET
Basophils Absolute: 0 10*3/uL (ref 0.0–0.2)
Basos: 0 %
EOS (ABSOLUTE): 0.2 10*3/uL (ref 0.0–0.4)
Eos: 3 %
Hematocrit: 41.3 % (ref 37.5–51.0)
Hemoglobin: 14.4 g/dL (ref 13.0–17.7)
Immature Grans (Abs): 0 10*3/uL (ref 0.0–0.1)
Immature Granulocytes: 0 %
Lymphocytes Absolute: 2.1 10*3/uL (ref 0.7–3.1)
Lymphs: 31 %
MCH: 30.3 pg (ref 26.6–33.0)
MCHC: 34.9 g/dL (ref 31.5–35.7)
MCV: 87 fL (ref 79–97)
Monocytes Absolute: 0.6 10*3/uL (ref 0.1–0.9)
Monocytes: 8 %
Neutrophils Absolute: 4 10*3/uL (ref 1.4–7.0)
Neutrophils: 58 %
Platelets: 221 10*3/uL (ref 150–450)
RBC: 4.76 x10E6/uL (ref 4.14–5.80)
RDW: 12.7 % (ref 11.6–15.4)
WBC: 6.9 10*3/uL (ref 3.4–10.8)

## 2021-10-01 LAB — CMP14+EGFR
ALT: 158 IU/L — ABNORMAL HIGH (ref 0–44)
AST: 111 IU/L — ABNORMAL HIGH (ref 0–40)
Albumin/Globulin Ratio: 1.7 (ref 1.2–2.2)
Albumin: 4 g/dL (ref 3.9–4.9)
Alkaline Phosphatase: 89 IU/L (ref 44–121)
BUN/Creatinine Ratio: 6 — ABNORMAL LOW (ref 10–24)
BUN: 8 mg/dL (ref 8–27)
Bilirubin Total: 0.7 mg/dL (ref 0.0–1.2)
CO2: 21 mmol/L (ref 20–29)
Calcium: 9.3 mg/dL (ref 8.6–10.2)
Chloride: 105 mmol/L (ref 96–106)
Creatinine, Ser: 1.44 mg/dL — ABNORMAL HIGH (ref 0.76–1.27)
Globulin, Total: 2.4 g/dL (ref 1.5–4.5)
Glucose: 143 mg/dL — ABNORMAL HIGH (ref 70–99)
Potassium: 4.3 mmol/L (ref 3.5–5.2)
Sodium: 140 mmol/L (ref 134–144)
Total Protein: 6.4 g/dL (ref 6.0–8.5)
eGFR: 53 mL/min/{1.73_m2} — ABNORMAL LOW (ref >59)

## 2021-10-01 LAB — LIPID PANEL
Chol/HDL Ratio: 1.8 ratio (ref 0.0–5.0)
Cholesterol, Total: 101 mg/dL (ref 100–199)
HDL: 55 mg/dL (ref >39)
LDL Chol Calc (NIH): 34 mg/dL (ref 0–99)
Triglycerides: 48 mg/dL (ref 0–149)
VLDL Cholesterol Cal: 12 mg/dL (ref 5–40)

## 2021-10-09 NOTE — Progress Notes (Signed)
Patient returning call. Please call back

## 2021-10-13 ENCOUNTER — Other Ambulatory Visit: Payer: Self-pay

## 2021-10-13 DIAGNOSIS — R7989 Other specified abnormal findings of blood chemistry: Secondary | ICD-10-CM

## 2021-10-26 ENCOUNTER — Other Ambulatory Visit: Payer: Medicare HMO

## 2021-10-26 DIAGNOSIS — R7989 Other specified abnormal findings of blood chemistry: Secondary | ICD-10-CM

## 2021-10-27 LAB — HEPATIC FUNCTION PANEL
ALT: 90 IU/L — ABNORMAL HIGH (ref 0–44)
AST: 67 IU/L — ABNORMAL HIGH (ref 0–40)
Albumin: 4.1 g/dL (ref 3.9–4.9)
Alkaline Phosphatase: 81 IU/L (ref 44–121)
Bilirubin Total: 1.1 mg/dL (ref 0.0–1.2)
Bilirubin, Direct: 0.26 mg/dL (ref 0.00–0.40)
Total Protein: 6.7 g/dL (ref 6.0–8.5)

## 2021-11-11 DIAGNOSIS — R809 Proteinuria, unspecified: Secondary | ICD-10-CM | POA: Diagnosis not present

## 2021-11-11 DIAGNOSIS — I129 Hypertensive chronic kidney disease with stage 1 through stage 4 chronic kidney disease, or unspecified chronic kidney disease: Secondary | ICD-10-CM | POA: Diagnosis not present

## 2021-11-11 DIAGNOSIS — E1122 Type 2 diabetes mellitus with diabetic chronic kidney disease: Secondary | ICD-10-CM | POA: Diagnosis not present

## 2021-11-11 DIAGNOSIS — E1129 Type 2 diabetes mellitus with other diabetic kidney complication: Secondary | ICD-10-CM | POA: Diagnosis not present

## 2021-11-11 DIAGNOSIS — N189 Chronic kidney disease, unspecified: Secondary | ICD-10-CM | POA: Diagnosis not present

## 2021-12-14 DIAGNOSIS — I129 Hypertensive chronic kidney disease with stage 1 through stage 4 chronic kidney disease, or unspecified chronic kidney disease: Secondary | ICD-10-CM | POA: Diagnosis not present

## 2021-12-14 DIAGNOSIS — R809 Proteinuria, unspecified: Secondary | ICD-10-CM | POA: Diagnosis not present

## 2021-12-14 DIAGNOSIS — E1122 Type 2 diabetes mellitus with diabetic chronic kidney disease: Secondary | ICD-10-CM | POA: Diagnosis not present

## 2021-12-14 DIAGNOSIS — N189 Chronic kidney disease, unspecified: Secondary | ICD-10-CM | POA: Diagnosis not present

## 2021-12-14 DIAGNOSIS — Z6837 Body mass index (BMI) 37.0-37.9, adult: Secondary | ICD-10-CM | POA: Diagnosis not present

## 2021-12-14 DIAGNOSIS — E1129 Type 2 diabetes mellitus with other diabetic kidney complication: Secondary | ICD-10-CM | POA: Diagnosis not present

## 2021-12-21 ENCOUNTER — Encounter: Payer: Self-pay | Admitting: Family Medicine

## 2021-12-21 ENCOUNTER — Ambulatory Visit (INDEPENDENT_AMBULATORY_CARE_PROVIDER_SITE_OTHER): Payer: Medicare HMO | Admitting: Family Medicine

## 2021-12-21 VITALS — BP 143/81 | HR 84 | Temp 98.2°F | Ht 65.0 in | Wt 228.0 lb

## 2021-12-21 DIAGNOSIS — N1831 Chronic kidney disease, stage 3a: Secondary | ICD-10-CM | POA: Diagnosis not present

## 2021-12-21 DIAGNOSIS — Z23 Encounter for immunization: Secondary | ICD-10-CM | POA: Diagnosis not present

## 2021-12-21 DIAGNOSIS — E1169 Type 2 diabetes mellitus with other specified complication: Secondary | ICD-10-CM

## 2021-12-21 DIAGNOSIS — N183 Chronic kidney disease, stage 3 unspecified: Secondary | ICD-10-CM | POA: Diagnosis not present

## 2021-12-21 DIAGNOSIS — E1122 Type 2 diabetes mellitus with diabetic chronic kidney disease: Secondary | ICD-10-CM

## 2021-12-21 DIAGNOSIS — E785 Hyperlipidemia, unspecified: Secondary | ICD-10-CM

## 2021-12-21 DIAGNOSIS — E1159 Type 2 diabetes mellitus with other circulatory complications: Secondary | ICD-10-CM

## 2021-12-21 DIAGNOSIS — I152 Hypertension secondary to endocrine disorders: Secondary | ICD-10-CM | POA: Diagnosis not present

## 2021-12-21 LAB — CBC WITH DIFFERENTIAL/PLATELET
Basophils Absolute: 0 10*3/uL (ref 0.0–0.2)
Basos: 0 %
EOS (ABSOLUTE): 0.2 10*3/uL (ref 0.0–0.4)
Eos: 2 %
Hematocrit: 42.6 % (ref 37.5–51.0)
Hemoglobin: 14.9 g/dL (ref 13.0–17.7)
Immature Grans (Abs): 0 10*3/uL (ref 0.0–0.1)
Immature Granulocytes: 0 %
Lymphocytes Absolute: 2.1 10*3/uL (ref 0.7–3.1)
Lymphs: 28 %
MCH: 30.2 pg (ref 26.6–33.0)
MCHC: 35 g/dL (ref 31.5–35.7)
MCV: 86 fL (ref 79–97)
Monocytes Absolute: 0.8 10*3/uL (ref 0.1–0.9)
Monocytes: 11 %
Neutrophils Absolute: 4.3 10*3/uL (ref 1.4–7.0)
Neutrophils: 59 %
Platelets: 242 10*3/uL (ref 150–450)
RBC: 4.94 x10E6/uL (ref 4.14–5.80)
RDW: 12.7 % (ref 11.6–15.4)
WBC: 7.4 10*3/uL (ref 3.4–10.8)

## 2021-12-21 LAB — CMP14+EGFR
ALT: 88 IU/L — ABNORMAL HIGH (ref 0–44)
AST: 65 IU/L — ABNORMAL HIGH (ref 0–40)
Albumin/Globulin Ratio: 1.5 (ref 1.2–2.2)
Albumin: 4 g/dL (ref 3.9–4.9)
Alkaline Phosphatase: 77 IU/L (ref 44–121)
BUN/Creatinine Ratio: 10 (ref 10–24)
BUN: 17 mg/dL (ref 8–27)
Bilirubin Total: 1.2 mg/dL (ref 0.0–1.2)
CO2: 21 mmol/L (ref 20–29)
Calcium: 9.7 mg/dL (ref 8.6–10.2)
Chloride: 103 mmol/L (ref 96–106)
Creatinine, Ser: 1.64 mg/dL — ABNORMAL HIGH (ref 0.76–1.27)
Globulin, Total: 2.6 g/dL (ref 1.5–4.5)
Glucose: 177 mg/dL — ABNORMAL HIGH (ref 70–99)
Potassium: 4 mmol/L (ref 3.5–5.2)
Sodium: 139 mmol/L (ref 134–144)
Total Protein: 6.6 g/dL (ref 6.0–8.5)
eGFR: 45 mL/min/{1.73_m2} — ABNORMAL LOW (ref >59)

## 2021-12-21 LAB — LIPID PANEL
Chol/HDL Ratio: 1.7 ratio (ref 0.0–5.0)
Cholesterol, Total: 86 mg/dL — ABNORMAL LOW (ref 100–199)
HDL: 50 mg/dL (ref >39)
LDL Chol Calc (NIH): 24 mg/dL (ref 0–99)
Triglycerides: 47 mg/dL (ref 0–149)
VLDL Cholesterol Cal: 12 mg/dL (ref 5–40)

## 2021-12-21 LAB — BAYER DCA HB A1C WAIVED: HB A1C (BAYER DCA - WAIVED): 6.8 % — ABNORMAL HIGH (ref 4.8–5.6)

## 2021-12-21 NOTE — Progress Notes (Signed)
BP (!) 143/81   Pulse 84   Temp 98.2 F (36.8 C)   Ht _0  (1.651 m)   Wt 228 lb (103.4 kg)   SpO2 98%   BMI 37.94 kg/m    Subjective:   Patient ID: Paul Williams, male    DOB: [birth year], 71 y.o.   MRN: [id number]  HPI: Paul Williams is a 71 y.o. male presenting on 12/21/2021 for Medical Management of Chronic Issues, Diabetes, Hyperlipidemia, and Chronic Kidney Disease   HPI Type 2 diabetes mellitus Patient comes in today for recheck of his diabetes. Patient has been currently taking Januvia. Patient is currently on an ACE inhibitor/ARB. Patient has seen an ophthalmologist this year. Patient denies any issues with their feet. The symptom started onset as an adult hypertension and hyperlipidemia and CKD ARE RELATED TO DM   Hypertension Patient is currently on labetalol and telmisartan, and their blood pressure today is 143/81. Patient denies any lightheadedness or dizziness. Patient denies headaches, blurred vision, chest pains, shortness of breath, or weakness. Denies any side effects from medication and is content with current medication.   Hyperlipidemia Patient is coming in for recheck of his hyperlipidemia. The patient is currently taking atorvastatin and fish oil. They deny any issues with myalgias or history of liver damage from it. They deny any focal numbness or weakness or chest pain.   Relevant past medical, surgical, family and social history reviewed and updated as indicated. Interim medical history since our last visit reviewed. Allergies and medications reviewed and updated.  Review of Systems  Constitutional:  Negative for chills and fever.  Eyes:  Negative for visual disturbance.  Respiratory:  Negative for shortness of breath and wheezing.   Cardiovascular:  Negative for chest pain and leg swelling.  Musculoskeletal:  Negative for back pain and gait problem.  Skin:  Negative for rash.  Neurological:  Negative for dizziness, weakness and light-headedness.  All  other systems reviewed and are negative.   Per HPI unless specifically indicated above   Allergies as of 12/21/2021   No Known Allergies      Medication List        Accurate as of December 21, 2021 11:23 AM. If you have any questions, ask your nurse or doctor.          allopurinol 100 MG tablet Commonly known as: ZYLOPRIM Take 0.5 tablets (50 mg total) by mouth daily.   aspirin EC 81 MG tablet Take 81 mg by mouth daily.   atorvastatin 40 MG tablet Commonly known as: LIPITOR Take 1 tablet (40 mg total) by mouth daily.   B-D SINGLE USE SWABS REGULAR Pads Test BS BID Dx E11.22   fish oil-omega-3 fatty acids 1000 MG capsule Take 1 g by mouth daily.   labetalol 200 MG tablet Commonly known as: NORMODYNE Take 200 mg by mouth 2 (two) times daily.   Mitigare 0.6 MG Caps Generic drug: Colchicine 2 tablets at pain onset, may repeat x1in 1 hour. No more then 3 tablets in 24 hours   multivitamin tablet Take 1 tablet by mouth daily.   OVER THE COUNTER MEDICATION Take 250 mg by mouth 2 (two) times daily. Super beta prostate   sildenafil 100 MG tablet Commonly known as: VIAGRA Take 0.5 tablets (50 mg total) by mouth as needed for erectile dysfunction.   sitaGLIPtin 100 MG tablet Commonly known as: Januvia Take 1 tablet (100 mg total) by mouth daily.   telmisartan 20 MG tablet Commonly known as: MICARDIS  Take 20 mg by mouth daily.   True Metrix Air Glucose Meter w/Device Kit Test BS BID Dx E11.22   True Metrix Blood Glucose Test test strip Generic drug: glucose blood Test BS BID Dx E11.22   True Metrix Level 1 Low Soln Use w/ glucose monitor Dx E11.22   TRUEplus Lancets 33G Misc Test BS BID Dx E11.22         Objective:   BP (!) 143/81   Pulse 84   Temp 98.2 F (36.8 C)   Ht _0  (1.651 m)   Wt 228 lb (103.4 kg)   SpO2 98%   BMI 37.94 kg/m   Wt Readings from Last 3 Encounters:  12/21/21 228 lb (103.4 kg)  09/30/21 234 lb (106.1 kg)   07/16/21 226 lb (102.5 kg)    Physical Exam Vitals and nursing note reviewed.  Constitutional:      General: He is not in acute distress.    Appearance: He is well-developed. He is not diaphoretic.  Eyes:     General: No scleral icterus.    Conjunctiva/sclera: Conjunctivae normal.  Neck:     Thyroid: No thyromegaly.  Cardiovascular:     Rate and Rhythm: Normal rate and regular rhythm.     Heart sounds: Normal heart sounds. No murmur heard. Pulmonary:     Effort: Pulmonary effort is normal. No respiratory distress.     Breath sounds: Normal breath sounds. No wheezing.  Musculoskeletal:        General: No swelling. Normal range of motion.     Cervical back: Neck supple.  Lymphadenopathy:     Cervical: No cervical adenopathy.  Skin:    General: Skin is warm and dry.     Findings: No rash.  Neurological:     Mental Status: He is alert and oriented to person, place, and time.     Coordination: Coordination normal.  Psychiatric:        Behavior: Behavior normal.       Assessment & Plan:   Problem List Items Addressed This Visit       Cardiovascular and Mediastinum   Hypertension associated with diabetes ([place])   Relevant Orders   CBC with Differential/Platelet   CMP14+EGFR   Lipid panel   Bayer DCA Hb A1c Waived     Endocrine   Hyperlipidemia associated with type 2 diabetes mellitus ([place]) - Primary   Relevant Orders   CBC with Differential/Platelet   CMP14+EGFR   Lipid panel   Bayer DCA Hb A1c Waived   Type 2 diabetes mellitus with diabetic chronic kidney disease (HCC)   Relevant Orders   CBC with Differential/Platelet   CMP14+EGFR   Lipid panel   Bayer DCA Hb A1c Waived   Microalbumin / creatinine urine ratio     Genitourinary   Chronic kidney disease (CKD), stage III (moderate) (HCC)   Relevant Orders   CBC with Differential/Platelet   CMP14+EGFR   Lipid panel   Bayer DCA Hb A1c Waived   Microalbumin / creatinine urine ratio   Other Visit  Diagnoses     Need for shingles vaccine       Relevant Orders   Zoster Recombinant (Shingrix ) (Completed)     Blood pressure slightly elevated, not at goal.  Patient is reluctant to change and says he just started doing walking regiment and exercise regiment again and wants to give that a few months before changing medication.  Patient wants to try getting some weight off and give  it a little bit of time on the blood pressure.  Will see back in 3 months  A1c is 6.4, no changes Follow up plan: Return in about 3 months (around 03/22/2022), or if symptoms worsen or fail to improve, for Diabetes hypertension and CKD.  Counseling provided for all of the vaccine components Orders Placed This Encounter  Procedures   Zoster Recombinant (Shingrix )   CBC with Differential/Platelet   CMP14+EGFR   Lipid panel   Bayer DCA Hb A1c Waived   Microalbumin / creatinine urine ratio    Caryl Pina, MD [facility] 12/21/2021, 11:23 AM

## 2021-12-23 DIAGNOSIS — H524 Presbyopia: Secondary | ICD-10-CM | POA: Diagnosis not present

## 2021-12-23 DIAGNOSIS — H52203 Unspecified astigmatism, bilateral: Secondary | ICD-10-CM | POA: Diagnosis not present

## 2021-12-23 DIAGNOSIS — E1136 Type 2 diabetes mellitus with diabetic cataract: Secondary | ICD-10-CM | POA: Diagnosis not present

## 2021-12-23 DIAGNOSIS — H25813 Combined forms of age-related cataract, bilateral: Secondary | ICD-10-CM | POA: Diagnosis not present

## 2022-01-11 DIAGNOSIS — H52209 Unspecified astigmatism, unspecified eye: Secondary | ICD-10-CM | POA: Diagnosis not present

## 2022-01-11 DIAGNOSIS — H5213 Myopia, bilateral: Secondary | ICD-10-CM | POA: Diagnosis not present

## 2022-01-11 DIAGNOSIS — H524 Presbyopia: Secondary | ICD-10-CM | POA: Diagnosis not present

## 2022-01-16 ENCOUNTER — Other Ambulatory Visit: Payer: Self-pay | Admitting: Family Medicine

## 2022-03-08 DIAGNOSIS — E1122 Type 2 diabetes mellitus with diabetic chronic kidney disease: Secondary | ICD-10-CM | POA: Diagnosis not present

## 2022-03-08 DIAGNOSIS — N189 Chronic kidney disease, unspecified: Secondary | ICD-10-CM | POA: Diagnosis not present

## 2022-03-08 DIAGNOSIS — I129 Hypertensive chronic kidney disease with stage 1 through stage 4 chronic kidney disease, or unspecified chronic kidney disease: Secondary | ICD-10-CM | POA: Diagnosis not present

## 2022-03-08 DIAGNOSIS — Z6837 Body mass index (BMI) 37.0-37.9, adult: Secondary | ICD-10-CM | POA: Diagnosis not present

## 2022-03-08 DIAGNOSIS — E1129 Type 2 diabetes mellitus with other diabetic kidney complication: Secondary | ICD-10-CM | POA: Diagnosis not present

## 2022-03-08 DIAGNOSIS — R809 Proteinuria, unspecified: Secondary | ICD-10-CM | POA: Diagnosis not present

## 2022-03-10 DIAGNOSIS — E1122 Type 2 diabetes mellitus with diabetic chronic kidney disease: Secondary | ICD-10-CM | POA: Diagnosis not present

## 2022-03-10 DIAGNOSIS — N189 Chronic kidney disease, unspecified: Secondary | ICD-10-CM | POA: Diagnosis not present

## 2022-03-10 DIAGNOSIS — E1129 Type 2 diabetes mellitus with other diabetic kidney complication: Secondary | ICD-10-CM | POA: Diagnosis not present

## 2022-03-10 DIAGNOSIS — R809 Proteinuria, unspecified: Secondary | ICD-10-CM | POA: Diagnosis not present

## 2022-03-10 DIAGNOSIS — Z6837 Body mass index (BMI) 37.0-37.9, adult: Secondary | ICD-10-CM | POA: Diagnosis not present

## 2022-03-10 DIAGNOSIS — I129 Hypertensive chronic kidney disease with stage 1 through stage 4 chronic kidney disease, or unspecified chronic kidney disease: Secondary | ICD-10-CM | POA: Diagnosis not present

## 2022-03-22 ENCOUNTER — Ambulatory Visit (INDEPENDENT_AMBULATORY_CARE_PROVIDER_SITE_OTHER): Payer: Medicare HMO | Admitting: Family Medicine

## 2022-03-22 ENCOUNTER — Encounter: Payer: Self-pay | Admitting: Family Medicine

## 2022-03-22 VITALS — BP 138/84 | HR 79 | Ht 65.0 in | Wt 230.0 lb

## 2022-03-22 DIAGNOSIS — E785 Hyperlipidemia, unspecified: Secondary | ICD-10-CM | POA: Diagnosis not present

## 2022-03-22 DIAGNOSIS — I152 Hypertension secondary to endocrine disorders: Secondary | ICD-10-CM | POA: Diagnosis not present

## 2022-03-22 DIAGNOSIS — E1159 Type 2 diabetes mellitus with other circulatory complications: Secondary | ICD-10-CM

## 2022-03-22 DIAGNOSIS — E1122 Type 2 diabetes mellitus with diabetic chronic kidney disease: Secondary | ICD-10-CM

## 2022-03-22 DIAGNOSIS — N183 Chronic kidney disease, stage 3 unspecified: Secondary | ICD-10-CM | POA: Diagnosis not present

## 2022-03-22 DIAGNOSIS — E1169 Type 2 diabetes mellitus with other specified complication: Secondary | ICD-10-CM | POA: Diagnosis not present

## 2022-03-22 DIAGNOSIS — N1831 Chronic kidney disease, stage 3a: Secondary | ICD-10-CM | POA: Diagnosis not present

## 2022-03-22 LAB — BAYER DCA HB A1C WAIVED: HB A1C (BAYER DCA - WAIVED): 6.7 % — ABNORMAL HIGH (ref 4.8–5.6)

## 2022-03-22 LAB — LIPID PANEL

## 2022-03-22 NOTE — Progress Notes (Signed)
BP 138/84   Pulse 79   Ht 5\' 5"  (1.651 m)   Wt 230 lb (104.3 kg)   SpO2 98%   BMI 38.27 kg/m    Subjective:   Patient ID: Paul Williams, male    DOB: 1950-12-11, 72 y.o.   MRN: [id number]  HPI: Paul Williams is a 72 y.o. male presenting on 03/22/2022 for Medical Management of Chronic Issues, Hyperlipidemia, and Diabetes   HPI Type 2 diabetes mellitus Patient comes in today for recheck of his diabetes. Patient has been currently taking [place]. Patient is currently on an ACE inhibitor/ARB. Patient has not seen an ophthalmologist this year. Patient denies any issues with their feet. The symptom started onset as an adult CKD and hypertension and hyperlipidemia ARE RELATED TO DM   Hypertension Patient is currently on labetalol and telmisartan, and their blood pressure today is 138/84. Patient denies any lightheadedness or dizziness. Patient denies headaches, blurred vision, chest pains, shortness of breath, or weakness. Denies any side effects from medication and is content with current medication.   Hyperlipidemia Patient is coming in for recheck of his hyperlipidemia. The patient is currently taking Lipitor. They deny any issues with myalgias or history of liver damage from it. They deny any focal numbness or weakness or chest pain.   Relevant past medical, surgical, family and social history reviewed and updated as indicated. Interim medical history since our last visit reviewed. Allergies and medications reviewed and updated.  Review of Systems  Constitutional:  Negative for chills and fever.  Eyes:  Negative for visual disturbance.  Respiratory:  Negative for shortness of breath and wheezing.   Cardiovascular:  Negative for chest pain and leg swelling.  Musculoskeletal:  Negative for back pain and gait problem.  Skin:  Negative for rash.  Neurological:  Negative for dizziness, weakness and light-headedness.  All other systems reviewed and are negative.   Per HPI unless  specifically indicated above   Allergies as of 03/22/2022   No Known Allergies      Medication List        Accurate as of March 22, 2022 12:31 PM. If you have any questions, ask your nurse or doctor.          allopurinol 100 MG tablet Commonly known as: ZYLOPRIM Take 0.5 tablets (50 mg total) by mouth daily.   aspirin EC 81 MG tablet Take 81 mg by mouth daily.   atorvastatin 40 MG tablet Commonly known as: LIPITOR Take 1 tablet (40 mg total) by mouth daily.   DropSafe Alcohol Prep 70 % Pads USE TWICE DAILY   fish oil-omega-3 fatty acids 1000 MG capsule Take 1 g by mouth daily.   labetalol 200 MG tablet Commonly known as: NORMODYNE Take 200 mg by mouth 2 (two) times daily.   Mitigare 0.6 MG Caps Generic drug: Colchicine 2 tablets at pain onset, may repeat x1in 1 hour. No more then 3 tablets in 24 hours   multivitamin tablet Take 1 tablet by mouth daily.   OVER THE COUNTER MEDICATION Take 250 mg by mouth 2 (two) times daily. Super beta prostate   sildenafil 100 MG tablet Commonly known as: VIAGRA Take 0.5 tablets (50 mg total) by mouth as needed for erectile dysfunction.   sitaGLIPtin 100 MG tablet Commonly known as: Januvia Take 1 tablet (100 mg total) by mouth daily.   telmisartan 20 MG tablet Commonly known as: MICARDIS Take 20 mg by mouth daily.   True Metrix Air Glucose Meter w/Device Kit  Test BS BID Dx E11.22   True Metrix Blood Glucose Test test strip Generic drug: glucose blood TEST BLOOD SUGAR TWICE DAILY   True Metrix Level 1 Low Soln Use w/ glucose monitor Dx E11.22   TRUEplus Lancets 33G Misc TEST BLOOD SUGAR TWICE DAILY         Objective:   BP 138/84   Pulse 79   Ht 5\' 5"  (1.651 m)   Wt 230 lb (104.3 kg)   SpO2 98%   BMI 38.27 kg/m   Wt Readings from Last 3 Encounters:  03/22/22 230 lb (104.3 kg)  12/21/21 228 lb (103.4 kg)  09/30/21 234 lb (106.1 kg)    Physical Exam Vitals and nursing note reviewed.   Constitutional:      General: He is not in acute distress.    Appearance: He is well-developed. He is not diaphoretic.  Eyes:     General: No scleral icterus.    Conjunctiva/sclera: Conjunctivae normal.  Neck:     Thyroid: No thyromegaly.  Cardiovascular:     Rate and Rhythm: Normal rate and regular rhythm.     Heart sounds: Normal heart sounds. No murmur heard. Pulmonary:     Effort: Pulmonary effort is normal. No respiratory distress.     Breath sounds: Normal breath sounds. No wheezing.  Musculoskeletal:        General: No swelling. Normal range of motion.     Cervical back: Neck supple.  Lymphadenopathy:     Cervical: No cervical adenopathy.  Skin:    General: Skin is warm and dry.     Findings: No rash.  Neurological:     Mental Status: He is alert and oriented to person, place, and time.     Coordination: Coordination normal.  Psychiatric:        Behavior: Behavior normal.       Assessment & Plan:   Problem List Items Addressed This Visit       Cardiovascular and Mediastinum   Hypertension associated with diabetes ([place])   Relevant Orders   CBC with Differential/Platelet   CMP14+EGFR   Lipid panel   Bayer DCA Hb A1c Waived     Endocrine   Hyperlipidemia associated with type 2 diabetes mellitus ([place]) - Primary   Relevant Orders   CBC with Differential/Platelet   CMP14+EGFR   Lipid panel   Bayer DCA Hb A1c Waived   Type 2 diabetes mellitus with diabetic chronic kidney disease (HCC)   Relevant Orders   CBC with Differential/Platelet   CMP14+EGFR   Lipid panel   Bayer DCA Hb A1c Waived     Genitourinary   Chronic kidney disease (CKD), stage III (moderate) (HCC)   Relevant Orders   CBC with Differential/Platelet   CMP14+EGFR   Lipid panel   Bayer DCA Hb A1c Waived    A1c is 6.7 which looks decent, focus on diet, no changes.  Continue to monitor blood pressure looks decent today. Follow up plan: Return in about 3 months (around 06/22/2022), or if  symptoms worsen or fail to improve, for Diabetes and hypertension and cholesterol.  Counseling provided for all of the vaccine components Orders Placed This Encounter  Procedures   CBC with Differential/Platelet   CMP14+EGFR   Lipid panel   Bayer DCA Hb A1c Waived    Caryl Pina, MD [facility] 03/22/2022, 12:31 PM

## 2022-03-23 LAB — CBC WITH DIFFERENTIAL/PLATELET
Basophils Absolute: 0 10*3/uL (ref 0.0–0.2)
Basos: 0 %
EOS (ABSOLUTE): 0.2 10*3/uL (ref 0.0–0.4)
Eos: 3 %
Hematocrit: 41.6 % (ref 37.5–51.0)
Hemoglobin: 14.2 g/dL (ref 13.0–17.7)
Immature Grans (Abs): 0 10*3/uL (ref 0.0–0.1)
Immature Granulocytes: 0 %
Lymphocytes Absolute: 1.9 10*3/uL (ref 0.7–3.1)
Lymphs: 25 %
MCH: 30 pg (ref 26.6–33.0)
MCHC: 34.1 g/dL (ref 31.5–35.7)
MCV: 88 fL (ref 79–97)
Monocytes Absolute: 0.7 10*3/uL (ref 0.1–0.9)
Monocytes: 8 %
Neutrophils Absolute: 5 10*3/uL (ref 1.4–7.0)
Neutrophils: 64 %
Platelets: 238 10*3/uL (ref 150–450)
RBC: 4.74 x10E6/uL (ref 4.14–5.80)
RDW: 12.6 % (ref 11.6–15.4)
WBC: 7.8 10*3/uL (ref 3.4–10.8)

## 2022-03-23 LAB — CMP14+EGFR
ALT: 36 IU/L (ref 0–44)
AST: 37 IU/L (ref 0–40)
Albumin/Globulin Ratio: 1.4 (ref 1.2–2.2)
Albumin: 4 g/dL (ref 3.9–4.9)
Alkaline Phosphatase: 91 IU/L (ref 44–121)
BUN/Creatinine Ratio: 8 — ABNORMAL LOW (ref 10–24)
BUN: 12 mg/dL (ref 8–27)
Bilirubin Total: 1 mg/dL (ref 0.0–1.2)
CO2: 20 mmol/L (ref 20–29)
Calcium: 10.1 mg/dL (ref 8.6–10.2)
Chloride: 107 mmol/L — ABNORMAL HIGH (ref 96–106)
Creatinine, Ser: 1.49 mg/dL — ABNORMAL HIGH (ref 0.76–1.27)
Globulin, Total: 2.9 g/dL (ref 1.5–4.5)
Glucose: 151 mg/dL — ABNORMAL HIGH (ref 70–99)
Potassium: 4.9 mmol/L (ref 3.5–5.2)
Sodium: 142 mmol/L (ref 134–144)
Total Protein: 6.9 g/dL (ref 6.0–8.5)
eGFR: 50 mL/min/{1.73_m2} — ABNORMAL LOW (ref >59)

## 2022-03-23 LAB — LIPID PANEL
Chol/HDL Ratio: 1.8 ratio (ref 0.0–5.0)
Cholesterol, Total: 88 mg/dL — ABNORMAL LOW (ref 100–199)
HDL: 50 mg/dL (ref >39)
LDL Chol Calc (NIH): 25 mg/dL (ref 0–99)
Triglycerides: 49 mg/dL (ref 0–149)
VLDL Cholesterol Cal: 13 mg/dL (ref 5–40)

## 2022-06-01 DIAGNOSIS — D631 Anemia in chronic kidney disease: Secondary | ICD-10-CM | POA: Diagnosis not present

## 2022-06-01 DIAGNOSIS — N1832 Chronic kidney disease, stage 3b: Secondary | ICD-10-CM | POA: Diagnosis not present

## 2022-06-01 DIAGNOSIS — N189 Chronic kidney disease, unspecified: Secondary | ICD-10-CM | POA: Diagnosis not present

## 2022-06-01 DIAGNOSIS — Z79899 Other long term (current) drug therapy: Secondary | ICD-10-CM | POA: Diagnosis not present

## 2022-06-01 DIAGNOSIS — R809 Proteinuria, unspecified: Secondary | ICD-10-CM | POA: Diagnosis not present

## 2022-06-14 DIAGNOSIS — E1122 Type 2 diabetes mellitus with diabetic chronic kidney disease: Secondary | ICD-10-CM | POA: Diagnosis not present

## 2022-06-14 DIAGNOSIS — E1129 Type 2 diabetes mellitus with other diabetic kidney complication: Secondary | ICD-10-CM | POA: Diagnosis not present

## 2022-06-14 DIAGNOSIS — I129 Hypertensive chronic kidney disease with stage 1 through stage 4 chronic kidney disease, or unspecified chronic kidney disease: Secondary | ICD-10-CM | POA: Diagnosis not present

## 2022-07-01 ENCOUNTER — Encounter: Payer: Self-pay | Admitting: Family Medicine

## 2022-07-01 ENCOUNTER — Ambulatory Visit (INDEPENDENT_AMBULATORY_CARE_PROVIDER_SITE_OTHER): Payer: Medicare HMO | Admitting: Family Medicine

## 2022-07-01 VITALS — BP 136/77 | HR 76 | Ht 65.0 in | Wt 234.0 lb

## 2022-07-01 DIAGNOSIS — E1122 Type 2 diabetes mellitus with diabetic chronic kidney disease: Secondary | ICD-10-CM | POA: Diagnosis not present

## 2022-07-01 DIAGNOSIS — N183 Chronic kidney disease, stage 3 unspecified: Secondary | ICD-10-CM

## 2022-07-01 DIAGNOSIS — E1169 Type 2 diabetes mellitus with other specified complication: Secondary | ICD-10-CM

## 2022-07-01 DIAGNOSIS — N1831 Chronic kidney disease, stage 3a: Secondary | ICD-10-CM

## 2022-07-01 DIAGNOSIS — M1A0720 Idiopathic chronic gout, left ankle and foot, without tophus (tophi): Secondary | ICD-10-CM

## 2022-07-01 DIAGNOSIS — I152 Hypertension secondary to endocrine disorders: Secondary | ICD-10-CM

## 2022-07-01 DIAGNOSIS — E785 Hyperlipidemia, unspecified: Secondary | ICD-10-CM | POA: Diagnosis not present

## 2022-07-01 DIAGNOSIS — E1159 Type 2 diabetes mellitus with other circulatory complications: Secondary | ICD-10-CM | POA: Diagnosis not present

## 2022-07-01 LAB — CMP14+EGFR
ALT: 32 IU/L (ref 0–44)
AST: 34 IU/L (ref 0–40)
Albumin: 3.8 g/dL — ABNORMAL LOW (ref 3.9–4.9)
Alkaline Phosphatase: 88 IU/L (ref 44–121)
BUN/Creatinine Ratio: 6 — ABNORMAL LOW (ref 10–24)
BUN: 8 mg/dL (ref 8–27)
Bilirubin Total: 1 mg/dL (ref 0.0–1.2)
CO2: 22 mmol/L (ref 20–29)
Calcium: 9.4 mg/dL (ref 8.6–10.2)
Chloride: 104 mmol/L (ref 96–106)
Creatinine, Ser: 1.31 mg/dL — ABNORMAL HIGH (ref 0.76–1.27)
Globulin, Total: 2.4 g/dL (ref 1.5–4.5)
Glucose: 132 mg/dL — ABNORMAL HIGH (ref 70–99)
Potassium: 4.5 mmol/L (ref 3.5–5.2)
Sodium: 139 mmol/L (ref 134–144)
Total Protein: 6.2 g/dL (ref 6.0–8.5)
eGFR: 59 mL/min/{1.73_m2} — ABNORMAL LOW (ref >59)

## 2022-07-01 LAB — CBC WITH DIFFERENTIAL/PLATELET
Basophils Absolute: 0 10*3/uL (ref 0.0–0.2)
Basos: 0 %
EOS (ABSOLUTE): 0.3 10*3/uL (ref 0.0–0.4)
Eos: 5 %
Hematocrit: 41 % (ref 37.5–51.0)
Hemoglobin: 13.6 g/dL (ref 13.0–17.7)
Immature Grans (Abs): 0 10*3/uL (ref 0.0–0.1)
Immature Granulocytes: 0 %
Lymphocytes Absolute: 2.1 10*3/uL (ref 0.7–3.1)
Lymphs: 32 %
MCH: 28.6 pg (ref 26.6–33.0)
MCHC: 33.2 g/dL (ref 31.5–35.7)
MCV: 86 fL (ref 79–97)
Monocytes Absolute: 0.6 10*3/uL (ref 0.1–0.9)
Monocytes: 9 %
Neutrophils Absolute: 3.4 10*3/uL (ref 1.4–7.0)
Neutrophils: 54 %
Platelets: 210 10*3/uL (ref 150–450)
RBC: 4.75 x10E6/uL (ref 4.14–5.80)
RDW: 13.3 % (ref 11.6–15.4)
WBC: 6.4 10*3/uL (ref 3.4–10.8)

## 2022-07-01 LAB — LIPID PANEL
Chol/HDL Ratio: 1.8 ratio (ref 0.0–5.0)
Cholesterol, Total: 101 mg/dL (ref 100–199)
HDL: 56 mg/dL (ref >39)
LDL Chol Calc (NIH): 32 mg/dL (ref 0–99)
Triglycerides: 52 mg/dL (ref 0–149)
VLDL Cholesterol Cal: 13 mg/dL (ref 5–40)

## 2022-07-01 LAB — BAYER DCA HB A1C WAIVED: HB A1C (BAYER DCA - WAIVED): 6.8 % — ABNORMAL HIGH (ref 4.8–5.6)

## 2022-07-01 MED ORDER — MITIGARE 0.6 MG PO CAPS: ORAL_CAPSULE | ORAL | 3 refills | Status: AC

## 2022-07-01 MED ORDER — SITAGLIPTIN PHOSPHATE 100 MG PO TABS: 100.0000 mg | ORAL_TABLET | Freq: Every day | ORAL | 3 refills | Status: DC

## 2022-07-01 MED ORDER — ATORVASTATIN CALCIUM 40 MG PO TABS: 40.0000 mg | ORAL_TABLET | Freq: Every day | ORAL | 3 refills | Status: DC

## 2022-07-01 MED ORDER — ALLOPURINOL 100 MG PO TABS: 50.0000 mg | ORAL_TABLET | Freq: Every day | ORAL | 3 refills | Status: DC

## 2022-07-01 NOTE — Progress Notes (Signed)
BP 136/77   Pulse 76   Ht 5\' 5"  (1.651 m)   Wt 234 lb (106.1 kg)   SpO2 97%   BMI 38.94 kg/m    Subjective:   Patient ID: Paul Williams, male    DOB: March 27, 1950, 72 y.o.   MRN: [id number]  HPI: Paul Williams is a 72 y.o. male presenting on 07/01/2022 for Medical Management of Chronic Issues, Chronic Kidney Disease, Diabetes, Hyperlipidemia, and Hypertension   HPI Type 2 diabetes mellitus Patient comes in today for recheck of his diabetes. Patient has been currently taking Januvia. Patient is currently on an ACE inhibitor/ARB. Patient has not seen an ophthalmologist this year. Patient denies any new issues with their feet. The symptom started onset as an adult hypertension and hyperlipidemia ARE RELATED TO DM   Hypertension Patient is currently on labetalol and telmisartan, and their blood pressure today is 136/77. Patient denies any lightheadedness or dizziness. Patient denies headaches, blurred vision, chest pains, shortness of breath, or weakness. Denies any side effects from medication and is content with current medication.   Hyperlipidemia Patient is coming in for recheck of his hyperlipidemia. The patient is currently taking atorvastatin. They deny any issues with myalgias or history of liver damage from it. They deny any focal numbness or weakness or chest pain.   Relevant past medical, surgical, family and social history reviewed and updated as indicated. Interim medical history since our last visit reviewed. Allergies and medications reviewed and updated.  Review of Systems  Constitutional:  Negative for chills and fever.  Eyes:  Negative for visual disturbance.  Respiratory:  Negative for shortness of breath and wheezing.   Cardiovascular:  Negative for chest pain and leg swelling.  Musculoskeletal:  Negative for back pain and gait problem.  Skin:  Negative for rash.  Neurological:  Negative for dizziness and light-headedness.  All other systems reviewed and are  negative.   Per HPI unless specifically indicated above   Allergies as of 07/01/2022   No Known Allergies      Medication List        Accurate as of July 01, 2022 10:02 AM. If you have any questions, ask your nurse or doctor.          allopurinol 100 MG tablet Commonly known as: ZYLOPRIM Take 0.5 tablets (50 mg total) by mouth daily.   aspirin EC 81 MG tablet Take 81 mg by mouth daily.   atorvastatin 40 MG tablet Commonly known as: LIPITOR Take 1 tablet (40 mg total) by mouth daily.   DropSafe Alcohol Prep 70 % Pads USE TWICE DAILY   fish oil-omega-3 fatty acids 1000 MG capsule Take 1 g by mouth daily.   labetalol 200 MG tablet Commonly known as: NORMODYNE Take 200 mg by mouth 2 (two) times daily.   Mitigare 0.6 MG Caps Generic drug: Colchicine 2 tablets at pain onset, may repeat x1in 1 hour. No more then 3 tablets in 24 hours   multivitamin tablet Take 1 tablet by mouth daily.   OVER THE COUNTER MEDICATION Take 250 mg by mouth 2 (two) times daily. Super beta prostate   sildenafil 100 MG tablet Commonly known as: VIAGRA Take 0.5 tablets (50 mg total) by mouth as needed for erectile dysfunction.   sitaGLIPtin 100 MG tablet Commonly known as: Januvia Take 1 tablet (100 mg total) by mouth daily.   telmisartan 20 MG tablet Commonly known as: MICARDIS Take 20 mg by mouth daily.   True Metrix Air Glucose Meter  w/Device Kit Test BS BID Dx E11.22   True Metrix Blood Glucose Test test strip Generic drug: glucose blood TEST BLOOD SUGAR TWICE DAILY   True Metrix Level 1 Low Soln Use w/ glucose monitor Dx E11.22   TRUEplus Lancets 33G Misc TEST BLOOD SUGAR TWICE DAILY         Objective:   BP 136/77   Pulse 76   Ht 5\' 5"  (1.651 m)   Wt 234 lb (106.1 kg)   SpO2 97%   BMI 38.94 kg/m   Wt Readings from Last 3 Encounters:  07/01/22 234 lb (106.1 kg)  03/22/22 230 lb (104.3 kg)  12/21/21 228 lb (103.4 kg)    Physical Exam Vitals and  nursing note reviewed.  Constitutional:      General: He is not in acute distress.    Appearance: He is well-developed. He is not diaphoretic.  Eyes:     General: No scleral icterus.    Conjunctiva/sclera: Conjunctivae normal.  Neck:     Thyroid: No thyromegaly.  Cardiovascular:     Rate and Rhythm: Normal rate and regular rhythm.     Heart sounds: Normal heart sounds. No murmur heard. Pulmonary:     Effort: Pulmonary effort is normal. No respiratory distress.     Breath sounds: Normal breath sounds. No wheezing.  Musculoskeletal:        General: No swelling. Normal range of motion.     Cervical back: Neck supple.  Lymphadenopathy:     Cervical: No cervical adenopathy.  Skin:    General: Skin is warm and dry.     Findings: No rash.  Neurological:     Mental Status: He is alert and oriented to person, place, and time.     Coordination: Coordination normal.  Psychiatric:        Behavior: Behavior normal.       Assessment & Plan:   Problem List Items Addressed This Visit       Cardiovascular and Mediastinum   Hypertension associated with diabetes (HCC)   Relevant Medications   atorvastatin (LIPITOR) 40 MG tablet   sitaGLIPtin (JANUVIA) 100 MG tablet     Endocrine   Hyperlipidemia associated with type 2 diabetes mellitus (HCC)   Relevant Medications   atorvastatin (LIPITOR) 40 MG tablet   sitaGLIPtin (JANUVIA) 100 MG tablet   Type 2 diabetes mellitus with diabetic chronic kidney disease (HCC) - Primary   Relevant Medications   atorvastatin (LIPITOR) 40 MG tablet   sitaGLIPtin (JANUVIA) 100 MG tablet   Other Relevant Orders   CBC with Differential/Platelet   CMP14+EGFR   Lipid panel   Bayer DCA Hb A1c Waived     Genitourinary   Chronic kidney disease (CKD), stage III (moderate) (HCC)   Relevant Orders   Microalbumin / creatinine urine ratio   Other Visit Diagnoses     Idiopathic chronic gout of left ankle without tophus       Relevant Medications    allopurinol (ZYLOPRIM) 100 MG tablet   MITIGARE 0.6 MG CAPS     A1c looks good, 6.8.  No changes, blood pressure looks good as well today.  Follow up plan: Return in about 3 months (around 10/01/2022), or if symptoms worsen or fail to improve, for Diabetes and hypertension.  Counseling provided for all of the vaccine components Orders Placed This Encounter  Procedures   CBC with Differential/Platelet   CMP14+EGFR   Lipid panel   Bayer DCA Hb A1c Waived   Microalbumin /  creatinine urine ratio    Arville Care, MD [facility] 07/01/2022, 10:02 AM

## 2022-07-02 LAB — MICROALBUMIN / CREATININE URINE RATIO
Creatinine, Urine: 173.6 mg/dL
Microalb/Creat Ratio: <2 mg/g creat (ref 0–29)
Microalbumin, Urine: <3 ug/mL

## 2022-07-21 ENCOUNTER — Ambulatory Visit (INDEPENDENT_AMBULATORY_CARE_PROVIDER_SITE_OTHER): Payer: Medicare HMO

## 2022-07-21 VITALS — Ht 67.0 in | Wt 231.0 lb

## 2022-07-21 DIAGNOSIS — Z0000 Encounter for general adult medical examination without abnormal findings: Secondary | ICD-10-CM

## 2022-07-21 NOTE — Progress Notes (Signed)
 Because this visit was a virtual/telehealth visit,  certain criteria was not obtained, such a blood pressure, CBG if patient is a diabetic, and timed up and go. Any medications not marked as "taking" was not mentioned during the medication reconciliation part of the visit. Any vitals not documented were not able to be obtained due to this being a telehealth visit. Vitals documented are verbally provided by the patient.   Subjective:   Paul Williams is a 72 y.o. male who presents for Medicare Annual/Subsequent preventive examination.  Visit Complete: Virtual  I connected with  Paul Williams on 07/21/22 by a audio enabled telemedicine application and verified that I am speaking with the correct person using two identifiers.  Patient Location: Home  Provider Location: Home Office  I discussed the limitations of evaluation and management by telemedicine. The patient expressed understanding and agreed to proceed.  Patient Medicare AWV questionnaire was completed by the patient on n/a; I have confirmed that all information answered by patient is correct and no changes since this date.  Review of Systems     Cardiac Risk Factors include: advanced age (>79men, >2 women);diabetes mellitus;dyslipidemia;hypertension;male gender;obesity (BMI >30kg/m2);sedentary lifestyle     Objective:    Today's Vitals   07/21/22 1343  Weight: 231 lb (104.8 kg)  Height: 5\' 7"  (1.702 m)   Body mass index is 36.18 kg/m.     07/21/2022    1:47 PM 07/16/2021    3:08 PM 07/02/2020    9:09 AM 06/26/2019    9:30 AM 06/22/2018   10:12 AM  Advanced Directives  Does Patient Have a Medical Advance Directive? No No No No No  Would patient like information on creating a medical advance directive? No - Patient declined No - Patient declined No - Patient declined No - Patient declined     Current Medications (verified) Outpatient Encounter Medications as of 07/21/2022  Medication Sig   Alcohol Swabs (DROPSAFE ALCOHOL  PREP) 70 % PADS USE TWICE DAILY   allopurinol (ZYLOPRIM) 100 MG tablet Take 0.5 tablets (50 mg total) by mouth daily.   aspirin EC 81 MG tablet Take 81 mg by mouth daily.   atorvastatin (LIPITOR) 40 MG tablet Take 1 tablet (40 mg total) by mouth daily.   Blood Glucose Calibration (TRUE METRIX LEVEL 1) Low SOLN Use w/ glucose monitor Dx E11.22   Blood Glucose Monitoring Suppl (TRUE METRIX AIR GLUCOSE METER) w/Device KIT Test BS BID Dx E11.22   fish oil-omega-3 fatty acids 1000 MG capsule Take 1 g by mouth daily.   glucose blood (TRUE METRIX BLOOD GLUCOSE TEST) test strip TEST BLOOD SUGAR TWICE DAILY   labetalol (NORMODYNE) 200 MG tablet Take 200 mg by mouth 2 (two) times daily.   MITIGARE 0.6 MG CAPS 2 tablets at pain onset, may repeat x1in 1 hour. No more then 3 tablets in 24 hours   Multiple Vitamin (MULTIVITAMIN) tablet Take 1 tablet by mouth daily.   OVER THE COUNTER MEDICATION Take 250 mg by mouth 2 (two) times daily. Super beta prostate   sildenafil (VIAGRA) 100 MG tablet Take 0.5 tablets (50 mg total) by mouth as needed for erectile dysfunction.   sitaGLIPtin (JANUVIA) 100 MG tablet Take 1 tablet (100 mg total) by mouth daily.   telmisartan (MICARDIS) 20 MG tablet Take 20 mg by mouth daily.   TRUEplus Lancets 33G MISC TEST BLOOD SUGAR TWICE DAILY   No facility-administered encounter medications on file as of 07/21/2022.    Allergies (verified) Patient has  no known allergies.   History: Past Medical History:  Diagnosis Date   Adenomatous polyp of colon    Diabetes mellitus without complication (HCC)    Diverticulosis    Hyperlipidemia    Hypertension    Internal hemorrhoids    Past Surgical History:  Procedure Laterality Date   COLONOSCOPY     POLYPECTOMY     top plate of teeth extracted     Family History  Problem Relation Age of Onset   Stroke Mother    Diabetes Father    Prostate cancer Father        prostate   Diabetes Sister    Diabetes Sister    Colon cancer  Neg Hx    Esophageal cancer Neg Hx    Rectal cancer Neg Hx    Stomach cancer Neg Hx    Colon polyps Neg Hx    Social History   Socioeconomic History   Marital status: Married    Spouse name: Arlene   Number of children: 1   Years of education: Not on file   Highest education level: Not on file  Occupational History   Occupation: retired    Associate Professor: [employer]  Tobacco Use   Smoking status: Never   Smokeless tobacco: Never  Vaping Use   Vaping status: Never Used  Substance and Sexual Activity   Alcohol use: Never   Drug use: No   Sexual activity: Yes    Partners: Female  Other Topics Concern   Not on file  Social History Narrative   Lives home with wife. They have one daughter in [place].   Social Determinants of Health   Financial Resource Strain: Low Risk  (07/21/2022)   Overall Financial Resource Strain (CARDIA)    Difficulty of Paying Living Expenses: Not hard at all  Food Insecurity: No Food Insecurity (07/21/2022)   Hunger Vital Sign    Worried About Running Out of Food in the Last Year: Never true    Ran Out of Food in the Last Year: Never true  Transportation Needs: No Transportation Needs (07/21/2022)   PRAPARE - Administrator, Civil Service (Medical): No    Lack of Transportation (Non-Medical): No  Physical Activity: Sufficiently Active (07/21/2022)   Exercise Vital Sign    Days of Exercise per Week: 7 days    Minutes of Exercise per Session: 30 min  Stress: No Stress Concern Present (07/21/2022)   [employer] of Occupational Health - Occupational Stress Questionnaire    Feeling of Stress : Not at all  Social Connections: Socially Integrated (07/21/2022)   Social Connection and Isolation Panel [NHANES]    Frequency of Communication with Friends and Family: More than three times a week    Frequency of Social Gatherings with Friends and Family: More than three times a week    Attends Religious Services: More than 4 times per year     Active Member of [employer] or Organizations: Yes    Attends Engineer, structural: More than 4 times per year    Marital Status: Married    Tobacco Counseling Counseling given: Yes   Clinical Intake:  Pre-visit preparation completed: Yes  Pain : No/denies pain     BMI - recorded: 36.18 Nutritional Status: BMI > 30  Obese Nutritional Risks: None Diabetes: Yes CBG done?: No (telehealth visit. pt checks his cbg qd) Did pt. bring in CBG monitor from home?: No  How often do you need to have someone help  you when you read instructions, pamphlets, or other written materials from your doctor or pharmacy?: 1 - Never  Interpreter Needed?: No  Information entered by ::  Masson Nalepa, CMA   Activities of Daily Living    07/21/2022    1:46 PM  In your present state of health, do you have any difficulty performing the following activities:  Hearing? 0  Vision? 0  Difficulty concentrating or making decisions? 0  Walking or climbing stairs? 0  Dressing or bathing? 0  Doing errands, shopping? 0  Preparing Food and eating ? N  Using the Toilet? N  In the past six months, have you accidently leaked urine? N  Do you have problems with loss of bowel control? N  Managing your Medications? N  Managing your Finances? N  Housekeeping or managing your Housekeeping? N    Patient Care Team: Dettinger, Elige Radon, MD as PCP - General (Family Medicine) Randa Lynn, MD as Consulting Physician (Nephrology) [facility], [facility]  Indicate any recent Medical Services you may have received from other than [facility] providers in the past year (date may be approximate).     Assessment:   This is a routine wellness examination for Doy.  Hearing/Vision screen Hearing Screening - Comments:: Patient denies any hearing difficulties.    Dietary issues and exercise activities discussed:     Goals Addressed               This Visit's Progress     Patient Stated (pt-stated)         Patients goal is to maintain his current activity level and to lose weight        Depression Screen    07/21/2022    1:48 PM 07/01/2022    9:22 AM 09/30/2021   10:07 AM 07/16/2021    3:00 PM 06/29/2021    9:13 AM 03/26/2021    9:05 AM 12/25/2020    8:43 AM  PHQ 2/9 Scores  PHQ - 2 Score 0 0 0 0 0 0 0  PHQ- 9 Score 0   0       Fall Risk    07/21/2022    1:47 PM 07/01/2022    9:22 AM 09/30/2021   10:07 AM 07/16/2021    2:56 PM 06/29/2021    9:13 AM  Fall Risk   Falls in the past year? 0 0 0 0 0  Number falls in past yr: 0   0   Injury with Fall? 0   0   Risk for fall due to : No Fall Risks   No Fall Risks   Follow up Falls prevention discussed   Falls prevention discussed     MEDICARE RISK AT HOME:  Medicare Risk at Home - 07/21/22 1347     Any stairs in or around the home? No    If so, are there any without handrails? No    Home free of loose throw rugs in walkways, pet beds, electrical cords, etc? Yes    Adequate lighting in your home to reduce risk of falls? Yes    Life alert? No    Use of a cane, walker or w/c? No    Grab bars in the bathroom? No    Shower chair or bench in shower? No    Elevated toilet seat or a handicapped toilet? No             TIMED UP AND GO:  Was the test performed?  No  Cognitive Function:    06/22/2018   10:15 AM  MMSE - Mini Mental State Exam  Orientation to time 5  Orientation to Place 5  Registration 3  Attention/ Calculation 5  Recall 3  Language- name 2 objects 2  Language- repeat 1  Language- follow 3 step command 1  Language- read & follow direction 1  Write a sentence 1  Copy design 1  Total score 28        07/21/2022    1:50 PM 07/16/2021    3:01 PM 06/26/2019    9:27 AM  6CIT Screen  What Year? 0 points 0 points 0 points  What month? 0 points 0 points 0 points  What time? 0 points 0 points 0 points  Count back from 20 0 points 0 points 0 points  Months in reverse 0 points 0 points 0 points  Repeat phrase  0 points 0 points 2 points  Total Score 0 points 0 points 2 points    Immunizations Immunization History  Administered Date(s) Administered   Moderna Covid-19 Vaccine Bivalent Booster 15yrs & up 10/15/2020, 01/14/2021   Moderna Sars-Covid-2 Vaccination 03/04/2020, 04/04/2020   Pneumococcal Conjugate-13 08/05/2016   Tdap 09/02/2016   Zoster Recombinant(Shingrix) 09/30/2021, 12/21/2021    TDAP status: Up to date  Flu Vaccine status: Up to date  Pneumococcal vaccine status: Up to date  Covid-19 vaccine status: Completed vaccines  Qualifies for Shingles Vaccine? Yes   Zostavax completed Yes   Shingrix Completed?: Yes  Screening Tests Health Maintenance  Topic Date Due   OPHTHALMOLOGY EXAM  12/22/2021   Medicare Annual Wellness (AWV)  07/17/2022   Pneumonia Vaccine 75+ Years old (2 of 2 - PPSV23 or PCV20) 07/01/2023 (Originally 08/05/2017)   INFLUENZA VACCINE  08/05/2022   HEMOGLOBIN A1C  12/31/2022   Diabetic kidney evaluation - eGFR measurement  07/01/2023   Diabetic kidney evaluation - Urine ACR  07/01/2023   FOOT EXAM  07/01/2023   DTaP/Tdap/Td (2 - Td or Tdap) 09/03/2026   Colonoscopy  10/28/2027   Hepatitis C Screening  Completed   Zoster Vaccines- Shingrix  Completed   HPV VACCINES  Aged Out   COVID-19 Vaccine  Discontinued    Health Maintenance  Health Maintenance Due  Topic Date Due   OPHTHALMOLOGY EXAM  12/22/2021   Medicare Annual Wellness (AWV)  07/17/2022    Colorectal cancer screening: Type of screening: Colonoscopy. Completed 10/27/2020. Repeat every 7 years  Lung Cancer Screening: (Low Dose CT Chest recommended if Age 62-80 years, 20 pack-year currently smoking OR have quit w/in 15years.) does not qualify.   Additional Screening:  Hepatitis C Screening: does not qualify; Completed 08/05/2016  Vision Screening: Recommended annual ophthalmology exams for early detection of glaucoma and other disorders of the eye. Is the patient up to date with their  annual eye exam?  Yes  Who is the provider or what is the name of the office in which the patient attends annual eye exams? Will Establish with Dr. Nilda Riggs in [facility] on Jan 10, 2023 If pt is not established with a provider, would they like to be referred to a provider to establish care? No .   Dental Screening: Recommended annual dental exams for proper oral hygiene  Diabetic Foot Exam: Diabetic Foot Exam: Completed 07/01/2022  Community Resource Referral / Chronic Care Management: CRR required this visit?  No   CCM required this visit?  No     Plan:     I have personally reviewed  and noted the following in the patient's chart:   Medical and social history Use of alcohol, tobacco or illicit drugs  Current medications and supplements including opioid prescriptions. Patient is not currently taking opioid prescriptions. Functional ability and status Nutritional status Physical activity Advanced directives List of other physicians Hospitalizations, surgeries, and ER visits in previous 12 months Vitals Screenings to include cognitive, depression, and falls Referrals and appointments  In addition, I have reviewed and discussed with patient certain preventive protocols, quality metrics, and best practice recommendations. A written personalized care plan for preventive services as well as general preventive health recommendations were provided to patient.     Jordan Hawks Shimshon Narula, CMA   07/21/2022   After Visit Summary: (Mail) Due to this being a telephonic visit, the after visit summary with patients personalized plan was offered to patient via mail   Nurse Notes:

## 2022-07-21 NOTE — Patient Instructions (Addendum)
Paul Williams , Thank you for taking time to come for your Medicare Wellness Visit. I appreciate your ongoing commitment to your health goals. Please review the following plan we discussed and let me know if I can assist you in the future.   These are the goals we discussed:  Goals       DIET - EAT MORE FRUITS AND VEGETABLES      Exercise 150 min/wk Moderate Activity      Patient Stated (pt-stated)      Patients goal is to maintain his current activity level and to lose weight         This is a list of the screening recommended for you and due dates:  Health Maintenance  Topic Date Due   Eye exam for diabetics  12/22/2021   Pneumonia Vaccine (2 of 2 - PPSV23 or PCV20) 07/01/2023*   Flu Shot  08/05/2022   Hemoglobin A1C  12/31/2022   Yearly kidney function blood test for diabetes  07/01/2023   Yearly kidney health urinalysis for diabetes  07/01/2023   Complete foot exam   07/01/2023   Medicare Annual Wellness Visit  07/21/2023   DTaP/Tdap/Td vaccine (2 - Td or Tdap) 09/03/2026   Colon Cancer Screening  10/28/2027   Hepatitis C Screening  Completed   Zoster (Shingles) Vaccine  Completed   HPV Vaccine  Aged Out   COVID-19 Vaccine  Discontinued  *Topic was postponed. The date shown is not the original due date.    Advanced directives: Advance directive discussed with you today. Even though you declined this today, please call our office should you change your mind, and we can give you the proper paperwork for you to fill out. Advance care planning is a way to make decisions about medical care that fits your values in case you are ever unable to make these decisions for yourself.  Information on Advanced Care Planning can be found at [facility] [place] Advance Health Care Directives Advance Health Care Directives ([email or web address]    Conditions/risks identified:  Aim for 30 minutes of exercise or brisk walking, 6-8 glasses of water, and 5 servings of fruits and vegetables  each day.  Next appointment: VIRTUAL/ TELEPHONE VISIT Follow up in one year for your annual wellness visit  July 22, 2023 at 10:30 am TELEPHONE VISIT   Preventive Care 63 Years and Older, Male  Preventive care refers to lifestyle choices and visits with your health care provider that can promote health and wellness. What does preventive care include? A yearly physical exam. This is also called an annual well check. Dental exams once or twice a year. Routine eye exams. Ask your health care provider how often you should have your eyes checked. Personal lifestyle choices, including: Daily care of your teeth and gums. Regular physical activity. Eating a healthy diet. Avoiding tobacco and drug use. Limiting alcohol use. Practicing safe sex. Taking low doses of aspirin every day. Taking vitamin and mineral supplements as recommended by your health care provider. What happens during an annual well check? The services and screenings done by your health care provider during your annual well check will depend on your age, overall health, lifestyle risk factors, and family history of disease. Counseling  Your health care provider may ask you questions about your: Alcohol use. Tobacco use. Drug use. Emotional well-being. Home and relationship well-being. Sexual activity. Eating habits. History of falls. Memory and ability to understand (cognition). Work and work Astronomer. Screening  You may  have the following tests or measurements: Height, weight, and BMI. Blood pressure. Lipid and cholesterol levels. These may be checked every 5 years, or more frequently if you are over 27 years old. Skin check. Lung cancer screening. You may have this screening every year starting at age 37 if you have a 30-pack-year history of smoking and currently smoke or have quit within the past 15 years. Fecal occult blood test (FOBT) of the stool. You may have this test every year starting at age  60. Flexible sigmoidoscopy or colonoscopy. You may have a sigmoidoscopy every 5 years or a colonoscopy every 10 years starting at age 3. Prostate cancer screening. Recommendations will vary depending on your family history and other risks. Hepatitis C blood test. Hepatitis B blood test. Sexually transmitted disease (STD) testing. Diabetes screening. This is done by checking your blood sugar (glucose) after you have not eaten for a while (fasting). You may have this done every 1-3 years. Abdominal aortic aneurysm (AAA) screening. You may need this if you are a current or former smoker. Osteoporosis. You may be screened starting at age 85 if you are at high risk. Talk with your health care provider about your test results, treatment options, and if necessary, the need for more tests. Vaccines  Your health care provider may recommend certain vaccines, such as: Influenza vaccine. This is recommended every year. Tetanus, diphtheria, and acellular pertussis (Tdap, Td) vaccine. You may need a Td booster every 10 years. Zoster vaccine. You may need this after age 42. Pneumococcal 13-valent conjugate (PCV13) vaccine. One dose is recommended after age 56. Pneumococcal polysaccharide (PPSV23) vaccine. One dose is recommended after age 49. Talk to your health care provider about which screenings and vaccines you need and how often you need them. This information is not intended to replace advice given to you by your health care provider. Make sure you discuss any questions you have with your health care provider. Document Released: 01/17/2015 Document Revised: 09/10/2015 Document Reviewed: 10/22/2014 Elsevier Interactive Patient Education  2017 [employer].  Fall Prevention in the Home Falls can cause injuries. They can happen to people of all ages. There are many things you can do to make your home safe and to help prevent falls. What can I do on the outside of my home? Regularly fix the edges of  walkways and driveways and fix any cracks. Remove anything that might make you trip as you walk through a door, such as a raised step or threshold. Trim any bushes or trees on the path to your home. Use bright outdoor lighting. Clear any walking paths of anything that might make someone trip, such as rocks or tools. Regularly check to see if handrails are loose or broken. Make sure that both sides of any steps have handrails. Any raised decks and porches should have guardrails on the edges. Have any leaves, snow, or ice cleared regularly. Use sand or salt on walking paths during winter. Clean up any spills in your garage right away. This includes oil or grease spills. What can I do in the bathroom? Use night lights. Install grab bars by the toilet and in the tub and shower. Do not use towel bars as grab bars. Use non-skid mats or decals in the tub or shower. If you need to sit down in the shower, use a plastic, non-slip stool. Keep the floor dry. Clean up any water that spills on the floor as soon as it happens. Remove soap buildup in the tub  or shower regularly. Attach bath mats securely with double-sided non-slip rug tape. Do not have throw rugs and other things on the floor that can make you trip. What can I do in the bedroom? Use night lights. Make sure that you have a light by your bed that is easy to reach. Do not use any sheets or blankets that are too big for your bed. They should not hang down onto the floor. Have a firm chair that has side arms. You can use this for support while you get dressed. Do not have throw rugs and other things on the floor that can make you trip. What can I do in the kitchen? Clean up any spills right away. Avoid walking on wet floors. Keep items that you use a lot in easy-to-reach places. If you need to reach something above you, use a strong step stool that has a grab bar. Keep electrical cords out of the way. Do not use floor polish or wax that  makes floors slippery. If you must use wax, use non-skid floor wax. Do not have throw rugs and other things on the floor that can make you trip. What can I do with my stairs? Do not leave any items on the stairs. Make sure that there are handrails on both sides of the stairs and use them. Fix handrails that are broken or loose. Make sure that handrails are as long as the stairways. Check any carpeting to make sure that it is firmly attached to the stairs. Fix any carpet that is loose or worn. Avoid having throw rugs at the top or bottom of the stairs. If you do have throw rugs, attach them to the floor with carpet tape. Make sure that you have a light switch at the top of the stairs and the bottom of the stairs. If you do not have them, ask someone to add them for you. What else can I do to help prevent falls? Wear shoes that: Do not have high heels. Have rubber bottoms. Are comfortable and fit you well. Are closed at the toe. Do not wear sandals. If you use a stepladder: Make sure that it is fully opened. Do not climb a closed stepladder. Make sure that both sides of the stepladder are locked into place. Ask someone to hold it for you, if possible. Clearly mark and make sure that you can see: Any grab bars or handrails. First and last steps. Where the edge of each step is. Use tools that help you move around (mobility aids) if they are needed. These include: Canes. Walkers. Scooters. Crutches. Turn on the lights when you go into a dark area. Replace any light bulbs as soon as they burn out. Set up your furniture so you have a clear path. Avoid moving your furniture around. If any of your floors are uneven, fix them. If there are any pets around you, be aware of where they are. Review your medicines with your doctor. Some medicines can make you feel dizzy. This can increase your chance of falling. Ask your doctor what other things that you can do to help prevent falls. This  information is not intended to replace advice given to you by your health care provider. Make sure you discuss any questions you have with your health care provider. Document Released: 10/17/2008 Document Revised: 05/29/2015 Document Reviewed: 01/25/2014 Elsevier Interactive Patient Education  2017 [employer].

## 2022-09-15 DIAGNOSIS — E559 Vitamin D deficiency, unspecified: Secondary | ICD-10-CM | POA: Diagnosis not present

## 2022-09-15 DIAGNOSIS — D631 Anemia in chronic kidney disease: Secondary | ICD-10-CM | POA: Diagnosis not present

## 2022-09-15 DIAGNOSIS — N189 Chronic kidney disease, unspecified: Secondary | ICD-10-CM | POA: Diagnosis not present

## 2022-09-15 DIAGNOSIS — I129 Hypertensive chronic kidney disease with stage 1 through stage 4 chronic kidney disease, or unspecified chronic kidney disease: Secondary | ICD-10-CM | POA: Diagnosis not present

## 2022-09-15 DIAGNOSIS — R809 Proteinuria, unspecified: Secondary | ICD-10-CM | POA: Diagnosis not present

## 2022-09-15 DIAGNOSIS — E1122 Type 2 diabetes mellitus with diabetic chronic kidney disease: Secondary | ICD-10-CM | POA: Diagnosis not present

## 2022-09-15 DIAGNOSIS — E1129 Type 2 diabetes mellitus with other diabetic kidney complication: Secondary | ICD-10-CM | POA: Diagnosis not present

## 2022-09-22 DIAGNOSIS — E1122 Type 2 diabetes mellitus with diabetic chronic kidney disease: Secondary | ICD-10-CM | POA: Diagnosis not present

## 2022-09-22 DIAGNOSIS — I129 Hypertensive chronic kidney disease with stage 1 through stage 4 chronic kidney disease, or unspecified chronic kidney disease: Secondary | ICD-10-CM | POA: Diagnosis not present

## 2022-09-22 DIAGNOSIS — R809 Proteinuria, unspecified: Secondary | ICD-10-CM | POA: Diagnosis not present

## 2022-10-04 ENCOUNTER — Ambulatory Visit (INDEPENDENT_AMBULATORY_CARE_PROVIDER_SITE_OTHER): Payer: Medicare HMO | Admitting: Family Medicine

## 2022-10-04 ENCOUNTER — Encounter: Payer: Self-pay | Admitting: Family Medicine

## 2022-10-04 VITALS — BP 146/81 | HR 73 | Temp 97.0°F | Resp 20 | Ht 67.0 in | Wt 233.0 lb

## 2022-10-04 DIAGNOSIS — E1159 Type 2 diabetes mellitus with other circulatory complications: Secondary | ICD-10-CM

## 2022-10-04 DIAGNOSIS — E1122 Type 2 diabetes mellitus with diabetic chronic kidney disease: Secondary | ICD-10-CM

## 2022-10-04 DIAGNOSIS — Z7984 Long term (current) use of oral hypoglycemic drugs: Secondary | ICD-10-CM

## 2022-10-04 DIAGNOSIS — E1169 Type 2 diabetes mellitus with other specified complication: Secondary | ICD-10-CM

## 2022-10-04 DIAGNOSIS — N1831 Chronic kidney disease, stage 3a: Secondary | ICD-10-CM

## 2022-10-04 DIAGNOSIS — N183 Chronic kidney disease, stage 3 unspecified: Secondary | ICD-10-CM | POA: Diagnosis not present

## 2022-10-04 DIAGNOSIS — E785 Hyperlipidemia, unspecified: Secondary | ICD-10-CM | POA: Diagnosis not present

## 2022-10-04 DIAGNOSIS — I152 Hypertension secondary to endocrine disorders: Secondary | ICD-10-CM

## 2022-10-04 LAB — CBC WITH DIFFERENTIAL/PLATELET
Basophils Absolute: 0 10*3/uL (ref 0.0–0.2)
Basos: 0 %
EOS (ABSOLUTE): 0.2 10*3/uL (ref 0.0–0.4)
Eos: 3 %
Hematocrit: 40.8 % (ref 37.5–51.0)
Hemoglobin: 13.7 g/dL (ref 13.0–17.7)
Immature Grans (Abs): 0 10*3/uL (ref 0.0–0.1)
Immature Granulocytes: 0 %
Lymphocytes Absolute: 2.1 10*3/uL (ref 0.7–3.1)
Lymphs: 31 %
MCH: 29.8 pg (ref 26.6–33.0)
MCHC: 33.6 g/dL (ref 31.5–35.7)
MCV: 89 fL (ref 79–97)
Monocytes Absolute: 0.7 10*3/uL (ref 0.1–0.9)
Monocytes: 10 %
Neutrophils Absolute: 3.9 10*3/uL (ref 1.4–7.0)
Neutrophils: 56 %
Platelets: 232 10*3/uL (ref 150–450)
RBC: 4.6 x10E6/uL (ref 4.14–5.80)
RDW: 13.3 % (ref 11.6–15.4)
WBC: 6.9 10*3/uL (ref 3.4–10.8)

## 2022-10-04 LAB — CMP14+EGFR
ALT: 29 [IU]/L (ref 0–44)
AST: 33 [IU]/L (ref 0–40)
Albumin: 4 g/dL (ref 3.9–4.9)
Alkaline Phosphatase: 79 [IU]/L (ref 44–121)
BUN/Creatinine Ratio: 7 — ABNORMAL LOW (ref 10–24)
BUN: 11 mg/dL (ref 8–27)
Bilirubin Total: 1.1 mg/dL (ref 0.0–1.2)
CO2: 22 mmol/L (ref 20–29)
Calcium: 9.5 mg/dL (ref 8.6–10.2)
Chloride: 105 mmol/L (ref 96–106)
Creatinine, Ser: 1.48 mg/dL — ABNORMAL HIGH (ref 0.76–1.27)
Globulin, Total: 2.3 g/dL (ref 1.5–4.5)
Glucose: 147 mg/dL — ABNORMAL HIGH (ref 70–99)
Potassium: 4.3 mmol/L (ref 3.5–5.2)
Sodium: 139 mmol/L (ref 134–144)
Total Protein: 6.3 g/dL (ref 6.0–8.5)
eGFR: 51 mL/min/{1.73_m2} — ABNORMAL LOW (ref >59)

## 2022-10-04 LAB — LIPID PANEL
Chol/HDL Ratio: 1.8 {ratio} (ref 0.0–5.0)
Cholesterol, Total: 83 mg/dL — ABNORMAL LOW (ref 100–199)
HDL: 46 mg/dL (ref >39)
LDL Chol Calc (NIH): 24 mg/dL (ref 0–99)
Triglycerides: 53 mg/dL (ref 0–149)
VLDL Cholesterol Cal: 13 mg/dL (ref 5–40)

## 2022-10-04 LAB — BAYER DCA HB A1C WAIVED: HB A1C (BAYER DCA - WAIVED): 6.6 % — ABNORMAL HIGH (ref 4.8–5.6)

## 2022-10-04 NOTE — Progress Notes (Signed)
BP (!) 146/81   Pulse 73   Temp (!) 97 F (36.1 C) (Oral)   Resp 20   Ht 5\' 7"  (1.702 m)   Wt 233 lb (105.7 kg)   SpO2 94%   BMI 36.49 kg/m    Subjective:   Patient ID: Paul Williams, male    DOB: [birth year], 72 y.o.   MRN: [id number]  HPI: Paul Williams is a 72 y.o. male presenting on 10/04/2022 for Medical Management of Chronic Issues   HPI Type 2 diabetes mellitus Patient comes in today for recheck of his diabetes. Patient has been currently taking Januvia. Patient is currently on an ACE inhibitor/ARB. Patient has not seen an ophthalmologist this year. Patient denies any new issues with their feet. The symptom started onset as an adult hypertension and hyperlipidemia and CKD 3 ARE RELATED TO DM   Hypertension Patient is currently on labetalol and telmisartan, and their blood pressure today is 146/81. Patient denies any lightheadedness or dizziness. Patient denies headaches, blurred vision, chest pains, shortness of breath, or weakness. Denies any side effects from medication and is content with current medication.   Hyperlipidemia Patient is coming in for recheck of his hyperlipidemia. The patient is currently taking atorvastatin and fish oils. They deny any issues with myalgias or history of liver damage from it. They deny any focal numbness or weakness or chest pain.   CKD 3 recheck Patient is coming in for CKD 3 recheck.  He does see nephrology and things have been stable.  Relevant past medical, surgical, family and social history reviewed and updated as indicated. Interim medical history since our last visit reviewed. Allergies and medications reviewed and updated.  Review of Systems  Constitutional:  Negative for chills and fever.  Eyes:  Negative for visual disturbance.  Respiratory:  Negative for shortness of breath and wheezing.   Cardiovascular:  Negative for chest pain and leg swelling.  Musculoskeletal:  Negative for back pain and gait problem.  Skin:  Negative  for rash.  Neurological:  Negative for dizziness, weakness and light-headedness.  All other systems reviewed and are negative.   Per HPI unless specifically indicated above   Allergies as of 10/04/2022   No Known Allergies      Medication List        Accurate as of October 04, 2022 10:06 AM. If you have any questions, ask your nurse or doctor.          allopurinol 100 MG tablet Commonly known as: ZYLOPRIM Take 0.5 tablets (50 mg total) by mouth daily.   aspirin EC 81 MG tablet Take 81 mg by mouth daily.   atorvastatin 40 MG tablet Commonly known as: LIPITOR Take 1 tablet (40 mg total) by mouth daily.   DropSafe Alcohol Prep 70 % Pads USE TWICE DAILY   fish oil-omega-3 fatty acids 1000 MG capsule Take 1 g by mouth daily.   labetalol 200 MG tablet Commonly known as: NORMODYNE Take 200 mg by mouth 2 (two) times daily.   Mitigare 0.6 MG Caps Generic drug: Colchicine 2 tablets at pain onset, may repeat x1in 1 hour. No more then 3 tablets in 24 hours   multivitamin tablet Take 1 tablet by mouth daily.   OVER THE COUNTER MEDICATION Take 250 mg by mouth 2 (two) times daily. Super beta prostate   sildenafil 100 MG tablet Commonly known as: VIAGRA Take 0.5 tablets (50 mg total) by mouth as needed for erectile dysfunction.   sitaGLIPtin 100 MG tablet  Commonly known as: Januvia Take 1 tablet (100 mg total) by mouth daily.   telmisartan 20 MG tablet Commonly known as: MICARDIS Take 20 mg by mouth daily.   True Metrix Air Glucose Meter w/Device Kit Test BS BID Dx E11.22   True Metrix Blood Glucose Test test strip Generic drug: glucose blood TEST BLOOD SUGAR TWICE DAILY   True Metrix Level 1 Low Soln Use w/ glucose monitor Dx E11.22   TRUEplus Lancets 33G Misc TEST BLOOD SUGAR TWICE DAILY         Objective:   BP (!) 146/81   Pulse 73   Temp (!) 97 F (36.1 C) (Oral)   Resp 20   Ht 5\' 7"  (1.702 m)   Wt 233 lb (105.7 kg)   SpO2 94%   BMI  36.49 kg/m   Wt Readings from Last 3 Encounters:  10/04/22 233 lb (105.7 kg)  07/21/22 231 lb (104.8 kg)  07/01/22 234 lb (106.1 kg)    Physical Exam Vitals and nursing note reviewed.  Constitutional:      General: He is not in acute distress.    Appearance: He is well-developed. He is not diaphoretic.  Eyes:     General: No scleral icterus.    Conjunctiva/sclera: Conjunctivae normal.  Neck:     Thyroid: No thyromegaly.  Cardiovascular:     Rate and Rhythm: Normal rate and regular rhythm.     Heart sounds: Normal heart sounds. No murmur heard. Pulmonary:     Effort: Pulmonary effort is normal. No respiratory distress.     Breath sounds: Normal breath sounds. No wheezing.  Musculoskeletal:        General: Normal range of motion.     Cervical back: Neck supple.  Lymphadenopathy:     Cervical: No cervical adenopathy.  Skin:    General: Skin is warm and dry.     Findings: No rash.  Neurological:     Mental Status: He is alert and oriented to person, place, and time.     Coordination: Coordination normal.  Psychiatric:        Behavior: Behavior normal.       Assessment & Plan:   Problem List Items Addressed This Visit       Cardiovascular and Mediastinum   Hypertension associated with diabetes (HCC)   Relevant Orders   CBC with Differential/Platelet   CMP14+EGFR   Lipid panel   Bayer DCA Hb A1c Waived     Endocrine   Hyperlipidemia associated with type 2 diabetes mellitus (HCC)   Relevant Orders   CBC with Differential/Platelet   CMP14+EGFR   Lipid panel   Bayer DCA Hb A1c Waived   Type 2 diabetes mellitus with diabetic chronic kidney disease (HCC)   Relevant Orders   CBC with Differential/Platelet   CMP14+EGFR   Lipid panel   Bayer DCA Hb A1c Waived     Genitourinary   Chronic kidney disease (CKD), stage III (moderate) (HCC) - Primary   Relevant Orders   CBC with Differential/Platelet   CMP14+EGFR   Lipid panel   Bayer DCA Hb A1c Waived    A1c  6.6 looks good, he is going to get his eye exam as soon as possible with his new eye doctor.  He is going to keep an eye on his blood pressures but everything else looks good.  No changes Follow up plan: Return in about 3 months (around 01/03/2023), or if symptoms worsen or fail to improve, for Diabetes hypertension and  cholesterol recheck.  Counseling provided for all of the vaccine components Orders Placed This Encounter  Procedures   CBC with Differential/Platelet   CMP14+EGFR   Lipid panel   Bayer DCA Hb A1c Waived    Arville Care, MD [place] [facility] 10/04/2022, 10:06 AM

## 2022-11-07 ENCOUNTER — Other Ambulatory Visit: Payer: Self-pay | Admitting: Family Medicine

## 2022-12-06 DIAGNOSIS — I129 Hypertensive chronic kidney disease with stage 1 through stage 4 chronic kidney disease, or unspecified chronic kidney disease: Secondary | ICD-10-CM | POA: Diagnosis not present

## 2022-12-06 DIAGNOSIS — N189 Chronic kidney disease, unspecified: Secondary | ICD-10-CM | POA: Diagnosis not present

## 2022-12-06 DIAGNOSIS — E119 Type 2 diabetes mellitus without complications: Secondary | ICD-10-CM | POA: Diagnosis not present

## 2022-12-06 DIAGNOSIS — E1122 Type 2 diabetes mellitus with diabetic chronic kidney disease: Secondary | ICD-10-CM | POA: Diagnosis not present

## 2022-12-06 DIAGNOSIS — I10 Essential (primary) hypertension: Secondary | ICD-10-CM | POA: Diagnosis not present

## 2022-12-06 DIAGNOSIS — R809 Proteinuria, unspecified: Secondary | ICD-10-CM | POA: Diagnosis not present

## 2022-12-06 DIAGNOSIS — E1129 Type 2 diabetes mellitus with other diabetic kidney complication: Secondary | ICD-10-CM | POA: Diagnosis not present

## 2022-12-18 DIAGNOSIS — I129 Hypertensive chronic kidney disease with stage 1 through stage 4 chronic kidney disease, or unspecified chronic kidney disease: Secondary | ICD-10-CM | POA: Diagnosis not present

## 2022-12-18 DIAGNOSIS — R809 Proteinuria, unspecified: Secondary | ICD-10-CM | POA: Diagnosis not present

## 2022-12-18 DIAGNOSIS — E1122 Type 2 diabetes mellitus with diabetic chronic kidney disease: Secondary | ICD-10-CM | POA: Diagnosis not present

## 2023-01-10 DIAGNOSIS — H52203 Unspecified astigmatism, bilateral: Secondary | ICD-10-CM | POA: Diagnosis not present

## 2023-01-10 DIAGNOSIS — H11153 Pinguecula, bilateral: Secondary | ICD-10-CM | POA: Diagnosis not present

## 2023-01-10 DIAGNOSIS — E119 Type 2 diabetes mellitus without complications: Secondary | ICD-10-CM | POA: Diagnosis not present

## 2023-01-10 DIAGNOSIS — Z7984 Long term (current) use of oral hypoglycemic drugs: Secondary | ICD-10-CM | POA: Diagnosis not present

## 2023-01-10 DIAGNOSIS — H524 Presbyopia: Secondary | ICD-10-CM | POA: Diagnosis not present

## 2023-01-10 DIAGNOSIS — H25013 Cortical age-related cataract, bilateral: Secondary | ICD-10-CM | POA: Diagnosis not present

## 2023-01-10 DIAGNOSIS — H2513 Age-related nuclear cataract, bilateral: Secondary | ICD-10-CM | POA: Diagnosis not present

## 2023-01-10 LAB — HM DIABETES EYE EXAM

## 2023-01-12 ENCOUNTER — Encounter: Payer: Self-pay | Admitting: Family Medicine

## 2023-01-12 ENCOUNTER — Ambulatory Visit: Payer: Medicare HMO | Admitting: Family Medicine

## 2023-01-12 VITALS — BP 131/71 | HR 84 | Ht 67.0 in | Wt 236.0 lb

## 2023-01-12 DIAGNOSIS — E1169 Type 2 diabetes mellitus with other specified complication: Secondary | ICD-10-CM | POA: Diagnosis not present

## 2023-01-12 DIAGNOSIS — N183 Chronic kidney disease, stage 3 unspecified: Secondary | ICD-10-CM | POA: Diagnosis not present

## 2023-01-12 DIAGNOSIS — E1122 Type 2 diabetes mellitus with diabetic chronic kidney disease: Secondary | ICD-10-CM | POA: Diagnosis not present

## 2023-01-12 DIAGNOSIS — E785 Hyperlipidemia, unspecified: Secondary | ICD-10-CM

## 2023-01-12 DIAGNOSIS — Z7984 Long term (current) use of oral hypoglycemic drugs: Secondary | ICD-10-CM | POA: Diagnosis not present

## 2023-01-12 DIAGNOSIS — E1159 Type 2 diabetes mellitus with other circulatory complications: Secondary | ICD-10-CM | POA: Diagnosis not present

## 2023-01-12 DIAGNOSIS — N1831 Chronic kidney disease, stage 3a: Secondary | ICD-10-CM

## 2023-01-12 LAB — BAYER DCA HB A1C WAIVED: HB A1C (BAYER DCA - WAIVED): 6.9 % — ABNORMAL HIGH (ref 4.8–5.6)

## 2023-01-12 LAB — LIPID PANEL

## 2023-01-12 NOTE — Progress Notes (Signed)
 BP 131/71   Pulse 84   Ht 5' 7 (1.702 m)   Wt 236 lb (107 kg)   SpO2 97%   BMI 36.96 kg/m    Subjective:   Patient ID: Paul Williams, male    DOB: [birth year], 73 y.o.   MRN: [id number]  HPI: Paul Williams is a 73 y.o. male presenting on 01/12/2023 for Medical Management of Chronic Issues, Diabetes, and Chronic Kidney Disease   HPI Type 2 diabetes mellitus Patient comes in today for recheck of his diabetes. Patient has been currently taking Januvia . Patient is currently on an ACE inhibitor/ARB. Patient has seen an ophthalmologist this year. Patient denies any new issues with their feet. The symptom started onset as an adult hypertension and hyperlipidemia and CKD ARE RELATED TO DM   Hypertension Patient is currently on labetalol and telmisartan, and their blood pressure today is 131/71. Patient denies any lightheadedness or dizziness. Patient denies headaches, blurred vision, chest pains, shortness of breath, or weakness. Denies any side effects from medication and is content with current medication.   Hyperlipidemia Patient is coming in for recheck of his hyperlipidemia. The patient is currently taking atorvastatin  and fish oils. They deny any issues with myalgias or history of liver damage from it. They deny any focal numbness or weakness or chest pain.   Relevant past medical, surgical, family and social history reviewed and updated as indicated. Interim medical history since our last visit reviewed. Allergies and medications reviewed and updated.  Review of Systems  Constitutional:  Negative for chills and fever.  Eyes:  Negative for visual disturbance.  Respiratory:  Negative for shortness of breath and wheezing.   Cardiovascular:  Negative for chest pain and leg swelling.  Musculoskeletal:  Negative for back pain and gait problem.  Skin:  Negative for rash.  Neurological:  Negative for dizziness and light-headedness.  All other systems reviewed and are negative.   Per HPI  unless specifically indicated above   Allergies as of 01/12/2023   No Known Allergies      Medication List        Accurate as of January 12, 2023 10:43 AM. If you have any questions, ask your nurse or doctor.          allopurinol  100 MG tablet Commonly known as: ZYLOPRIM  Take 0.5 tablets (50 mg total) by mouth daily.   aspirin EC 81 MG tablet Take 81 mg by mouth daily.   atorvastatin  40 MG tablet Commonly known as: LIPITOR Take 1 tablet (40 mg total) by mouth daily.   DropSafe Alcohol Prep 70 % Pads Test BS BID Dx E11.22   fish oil-omega-3 fatty acids 1000 MG capsule Take 1 g by mouth daily.   labetalol 200 MG tablet Commonly known as: NORMODYNE Take 200 mg by mouth 2 (two) times daily.   Mitigare  0.6 MG Caps Generic drug: Colchicine  2 tablets at pain onset, may repeat x1in 1 hour. No more then 3 tablets in 24 hours   multivitamin tablet Take 1 tablet by mouth daily.   OVER THE COUNTER MEDICATION Take 250 mg by mouth 2 (two) times daily. Super beta prostate   sildenafil  100 MG tablet Commonly known as: VIAGRA  Take 0.5 tablets (50 mg total) by mouth as needed for erectile dysfunction.   sitaGLIPtin  100 MG tablet Commonly known as: Januvia  Take 1 tablet (100 mg total) by mouth daily.   telmisartan 20 MG tablet Commonly known as: MICARDIS Take 20 mg by mouth daily.   True  Metrix Air Glucose Meter w/Device Kit Test BS BID Dx E11.22   True Metrix Blood Glucose Test test strip Generic drug: glucose blood Test BS BID Dx E11.22   True Metrix Level 1 Low Soln Use w/ glucose monitor Dx E11.22   TRUEplus Lancets 33G Misc Test BS BID Dx E11.22         Objective:   BP 131/71   Pulse 84   Ht 5' 7 (1.702 m)   Wt 236 lb (107 kg)   SpO2 97%   BMI 36.96 kg/m   Wt Readings from Last 3 Encounters:  01/12/23 236 lb (107 kg)  10/04/22 233 lb (105.7 kg)  07/21/22 231 lb (104.8 kg)    Physical Exam Vitals and nursing note reviewed.  Constitutional:       General: He is not in acute distress.    Appearance: He is well-developed. He is not diaphoretic.  Eyes:     General: No scleral icterus.    Conjunctiva/sclera: Conjunctivae normal.  Neck:     Thyroid : No thyromegaly.  Cardiovascular:     Rate and Rhythm: Normal rate and regular rhythm.     Heart sounds: Normal heart sounds. No murmur heard. Pulmonary:     Effort: Pulmonary effort is normal. No respiratory distress.     Breath sounds: Normal breath sounds. No wheezing.  Musculoskeletal:        General: No swelling. Normal range of motion.     Cervical back: Neck supple.  Lymphadenopathy:     Cervical: No cervical adenopathy.  Skin:    General: Skin is warm and dry.     Findings: No rash.  Neurological:     Mental Status: He is alert and oriented to person, place, and time.     Coordination: Coordination normal.  Psychiatric:        Behavior: Behavior normal.       Assessment & Plan:   Problem List Items Addressed This Visit       Cardiovascular and Mediastinum   Hypertension associated with diabetes (HCC)     Endocrine   Hyperlipidemia associated with type 2 diabetes mellitus (HCC)   Type 2 diabetes mellitus with diabetic chronic kidney disease (HCC) - Primary   Relevant Orders   CBC with Differential/Platelet   CMP14+EGFR   Lipid panel   Bayer DCA Hb A1c Waived   PSA, total and free     Genitourinary   Chronic kidney disease (CKD), stage III (moderate) (HCC)    A1c 6.9, continue current current meds. Follow up plan: Return in about 3 months (around 04/12/2023), or if symptoms worsen or fail to improve, for Diabetes and hypertension and cholesterol.  Counseling provided for all of the vaccine components Orders Placed This Encounter  Procedures   CBC with Differential/Platelet   CMP14+EGFR   Lipid panel   Bayer DCA Hb A1c Waived   PSA, total and free    Fonda Levins, MD [facility] 01/12/2023, 10:43 AM

## 2023-01-13 LAB — CMP14+EGFR
ALT: 26 IU/L (ref 0–44)
AST: 32 IU/L (ref 0–40)
Albumin: 3.7 g/dL — ABNORMAL LOW (ref 3.9–4.9)
Alkaline Phosphatase: 91 IU/L (ref 44–121)
BUN/Creatinine Ratio: 7 — ABNORMAL LOW (ref 10–24)
BUN: 11 mg/dL (ref 8–27)
Bilirubin Total: 0.5 mg/dL (ref 0.0–1.2)
CO2: 25 mmol/L (ref 20–29)
Calcium: 9.8 mg/dL (ref 8.6–10.2)
Chloride: 104 mmol/L (ref 96–106)
Creatinine, Ser: 1.58 mg/dL — ABNORMAL HIGH (ref 0.76–1.27)
Globulin, Total: 2.6 g/dL (ref 1.5–4.5)
Glucose: 279 mg/dL — ABNORMAL HIGH (ref 70–99)
Potassium: 4.2 mmol/L (ref 3.5–5.2)
Sodium: 141 mmol/L (ref 134–144)
Total Protein: 6.3 g/dL (ref 6.0–8.5)
eGFR: 47 mL/min/{1.73_m2} — ABNORMAL LOW (ref >59)

## 2023-01-13 LAB — CBC WITH DIFFERENTIAL/PLATELET
Basophils Absolute: 0 10*3/uL (ref 0.0–0.2)
Basos: 0 %
EOS (ABSOLUTE): 0.2 10*3/uL (ref 0.0–0.4)
Eos: 3 %
Hematocrit: 38.9 % (ref 37.5–51.0)
Hemoglobin: 12.7 g/dL — ABNORMAL LOW (ref 13.0–17.7)
Immature Grans (Abs): 0 10*3/uL (ref 0.0–0.1)
Immature Granulocytes: 0 %
Lymphocytes Absolute: 1.7 10*3/uL (ref 0.7–3.1)
Lymphs: 23 %
MCH: 29.4 pg (ref 26.6–33.0)
MCHC: 32.6 g/dL (ref 31.5–35.7)
MCV: 90 fL (ref 79–97)
Monocytes Absolute: 0.6 10*3/uL (ref 0.1–0.9)
Monocytes: 8 %
Neutrophils Absolute: 4.8 10*3/uL (ref 1.4–7.0)
Neutrophils: 66 %
Platelets: 284 10*3/uL (ref 150–450)
RBC: 4.32 x10E6/uL (ref 4.14–5.80)
RDW: 12.5 % (ref 11.6–15.4)
WBC: 7.4 10*3/uL (ref 3.4–10.8)

## 2023-01-13 LAB — PSA, TOTAL AND FREE
PSA, Free Pct: 12.2 %
PSA, Free: 0.9 ng/mL
Prostate Specific Ag, Serum: 7.4 ng/mL — ABNORMAL HIGH (ref 0.0–4.0)

## 2023-01-13 LAB — LIPID PANEL
Cholesterol, Total: 89 mg/dL — ABNORMAL LOW (ref 100–199)
HDL: 45 mg/dL (ref >39)
LDL CALC COMMENT:: 2 ratio (ref 0.0–5.0)
LDL Chol Calc (NIH): 31 mg/dL (ref 0–99)
Triglycerides: 54 mg/dL (ref 0–149)
VLDL Cholesterol Cal: 13 mg/dL (ref 5–40)

## 2023-01-20 ENCOUNTER — Other Ambulatory Visit: Payer: Self-pay | Admitting: Family Medicine

## 2023-01-20 DIAGNOSIS — R972 Elevated prostate specific antigen [PSA]: Secondary | ICD-10-CM

## 2023-02-25 ENCOUNTER — Encounter: Payer: Self-pay | Admitting: Urology

## 2023-02-25 ENCOUNTER — Ambulatory Visit: Payer: Medicare HMO | Admitting: Urology

## 2023-02-25 VITALS — BP 165/72 | HR 86 | Ht 67.0 in | Wt 236.0 lb

## 2023-02-25 DIAGNOSIS — R972 Elevated prostate specific antigen [PSA]: Secondary | ICD-10-CM

## 2023-02-25 DIAGNOSIS — N432 Other hydrocele: Secondary | ICD-10-CM | POA: Diagnosis not present

## 2023-02-25 LAB — URINALYSIS, ROUTINE W REFLEX MICROSCOPIC
Bilirubin, UA: NEGATIVE
Glucose, UA: NEGATIVE
Ketones, UA: NEGATIVE
Leukocytes,UA: NEGATIVE
Nitrite, UA: NEGATIVE
RBC, UA: NEGATIVE
Specific Gravity, UA: 1.03 (ref 1.005–1.030)
Urobilinogen, Ur: 0.2 mg/dL (ref 0.2–1.0)
pH, UA: 6 (ref 5.0–7.5)

## 2023-02-25 NOTE — Progress Notes (Signed)
02/25/2023 9:19 AM   Paul Williams [birth year] [id number]  Referring provider: Dettinger, Elige Radon, MD [address] [place],  [place] [address]  Elevated PSA   HPI: Paul Williams is a 72yo here for evaluation of elevated PSA. PSA was 2.2 seven years ago and then his most recent PSA was 7.4.  No other PSAs are available.  IPSS 7 QOL 3 on super beta prostate. Urine stream strong. No straining to urinate. Nocturia 1x. He has DMII and CKD3. He has swelling in his scrotum for the past 2 years. His father may have had prostate cancer and passed away at age 30.    PMH: Past Medical History:  Diagnosis Date   Adenomatous polyp of colon    Diabetes mellitus without complication (HCC)    Diverticulosis    Hyperlipidemia    Hypertension    Internal hemorrhoids     Surgical History: Past Surgical History:  Procedure Laterality Date   COLONOSCOPY     POLYPECTOMY     top plate of teeth extracted      Home Medications:  Allergies as of 02/25/2023   No Known Allergies      Medication List        Accurate as of February 25, 2023  9:19 AM. If you have any questions, ask your nurse or doctor.          allopurinol 100 MG tablet Commonly known as: ZYLOPRIM Take 0.5 tablets (50 mg total) by mouth daily.   aspirin EC 81 MG tablet Take 81 mg by mouth daily.   atorvastatin 40 MG tablet Commonly known as: LIPITOR Take 1 tablet (40 mg total) by mouth daily.   DropSafe Alcohol Prep 70 % Pads Test BS BID Dx E11.22   fish oil-omega-3 fatty acids 1000 MG capsule Take 1 g by mouth daily.   labetalol 200 MG tablet Commonly known as: NORMODYNE Take 200 mg by mouth 2 (two) times daily.   Mitigare 0.6 MG Caps Generic drug: Colchicine 2 tablets at pain onset, may repeat x1in 1 hour. No more then 3 tablets in 24 hours   multivitamin tablet Take 1 tablet by mouth daily.   OVER THE COUNTER MEDICATION Take 250 mg by mouth 2 (two) times daily. Super beta prostate   sildenafil 100 MG  tablet Commonly known as: VIAGRA Take 0.5 tablets (50 mg total) by mouth as needed for erectile dysfunction.   sitaGLIPtin 100 MG tablet Commonly known as: Januvia Take 1 tablet (100 mg total) by mouth daily.   telmisartan 20 MG tablet Commonly known as: MICARDIS Take 20 mg by mouth daily.   True Metrix Air Glucose Meter w/Device Kit Test BS BID Dx E11.22   True Metrix Blood Glucose Test test strip Generic drug: glucose blood Test BS BID Dx E11.22   True Metrix Level 1 Low Soln Use w/ glucose monitor Dx E11.22   TRUEplus Lancets 33G Misc Test BS BID Dx E11.22        Allergies: No Known Allergies  Family History: Family History  Problem Relation Age of Onset   Stroke Mother    Diabetes Father    Prostate cancer Father        prostate   Diabetes Sister    Diabetes Sister    Colon cancer Neg Hx    Esophageal cancer Neg Hx    Rectal cancer Neg Hx    Stomach cancer Neg Hx    Colon polyps Neg Hx     Social  History:  reports that he has never smoked. He has never used smokeless tobacco. He reports that he does not drink alcohol and does not use drugs.  ROS: All other review of systems were reviewed and are negative except what is noted above in HPI  Physical Exam: BP (!) 165/72   Pulse 86   Ht 5\' 7"  (1.702 m)   Wt 236 lb (107 kg)   BMI 36.96 kg/m   Constitutional:  Alert and oriented, No acute distress. HEENT: [place] AT, moist mucus membranes.  Trachea midline, no masses. Cardiovascular: No clubbing, cyanosis, or edema. Respiratory: Normal respiratory effort, no increased work of breathing. GI: Abdomen is soft, nontender, nondistended, no abdominal masses GU: No CVA tenderness. uncircumcised phallus. Right scrotal swelling, 10cm. Prostate 40g smooth no nodules no induration.  Lymph: No cervical or inguinal lymphadenopathy. Skin: No rashes, bruises or suspicious lesions. Neurologic: Grossly intact, no focal deficits, moving all 4 extremities. Psychiatric:  Normal mood and affect.  Laboratory Data: Lab Results  Component Value Date   WBC 7.4 01/12/2023   HGB 12.7 (L) 01/12/2023   HCT 38.9 01/12/2023   MCV 90 01/12/2023   PLT 284 01/12/2023    Lab Results  Component Value Date   CREATININE 1.58 (H) 01/12/2023    Lab Results  Component Value Date   PSA 2.0 03/28/2014   PSA 2.0 06/15/2013    No results found for: "TESTOSTERONE"  Lab Results  Component Value Date   HGBA1C 6.9 (H) 01/12/2023    Urinalysis No results found for: "COLORURINE", "APPEARANCEUR", "LABSPEC", "PHURINE", "GLUCOSEU", "HGBUR", "BILIRUBINUR", "KETONESUR", "PROTEINUR", "UROBILINOGEN", "NITRITE", "LEUKOCYTESUR"  Lab Results  Component Value Date   LABMICR <3.0 07/01/2022    Pertinent Imaging:  No results found for this or any previous visit.  No results found for this or any previous visit.  No results found for this or any previous visit.  No results found for this or any previous visit.  Results for orders placed during the hospital encounter of 01/18/17  US RENAL  Narrative CLINICAL DATA:  73 year old hypertensive diabetic male with chronic kidney disease stage 3. Initial encounter.  EXAM: RENAL / URINARY TRACT ULTRASOUND COMPLETE  COMPARISON:  05/19/2016 CT.  FINDINGS: Right Kidney:  Length: 10.8 cm. Echogenicity within normal limits. No hydronephrosis. Lower pole 2.6 x 2.8 x 2.7 cm cyst.  Left Kidney:  Length: 11 cm. Echogenicity within normal limits. No mass or hydronephrosis visualized.  Bladder:  Under distended. Circumferential wall thickening may be related to under distension.  IMPRESSION: No hydronephrosis.  Right lower pole 2.8 cm cyst.  Under distended urinary bladder.   Electronically Signed By: Lacy Duverney M.D. On: 01/18/2017 19:34  No results found for this or any previous visit.  No results found for this or any previous visit.  No results found for this or any previous visit.   Assessment &  Plan:    1. Elevated PSA (Primary) The patient and I talked about etiologies of elevated PSA.  We discussed the possible relationship between elevated PSA and prostate cancer, BPH, prostatitis, infection trauma and recent ejaculations. We will rec heck PSa today  If it remains elevated we will proceed with prostate biopsy.  - Urinalysis, Routine w reflex microscopic  2. Right scrotal swelling -scrotal US and check for right inguinal hernia    No follow-ups on file.  Wilkie Aye, MD  [facility] [place]

## 2023-02-25 NOTE — Patient Instructions (Signed)

## 2023-02-26 LAB — PSA, TOTAL AND FREE
PSA, Free Pct: 15.7 %
PSA, Free: 1.02 ng/mL
Prostate Specific Ag, Serum: 6.5 ng/mL — ABNORMAL HIGH (ref 0.0–4.0)

## 2023-03-02 ENCOUNTER — Ambulatory Visit (HOSPITAL_COMMUNITY)
Admission: RE | Admit: 2023-03-02 | Discharge: 2023-03-02 | Disposition: A | Discharge status: Home / Self Care | Payer: Medicare HMO | Source: Ambulatory Visit | Attending: Urology | Admitting: Urology

## 2023-03-02 DIAGNOSIS — N433 Hydrocele, unspecified: Secondary | ICD-10-CM | POA: Diagnosis not present

## 2023-03-02 DIAGNOSIS — N432 Other hydrocele: Secondary | ICD-10-CM | POA: Diagnosis not present

## 2023-03-02 DIAGNOSIS — N5089 Other specified disorders of the male genital organs: Secondary | ICD-10-CM | POA: Diagnosis not present

## 2023-03-02 DIAGNOSIS — N503 Cyst of epididymis: Secondary | ICD-10-CM | POA: Diagnosis not present

## 2023-03-15 ENCOUNTER — Telehealth: Payer: Self-pay

## 2023-03-15 DIAGNOSIS — R972 Elevated prostate specific antigen [PSA]: Secondary | ICD-10-CM

## 2023-03-15 MED ORDER — LEVOFLOXACIN 750 MG PO TABS: 750.0000 mg | ORAL_TABLET | Freq: Every day | ORAL | 0 refills | Status: DC

## 2023-03-15 NOTE — Telephone Encounter (Signed)
-----   Message from Wilkie Aye sent at 03/15/2023  9:05 AM EDT ----- Paul Williams remains high. He should be scheduled for prostate biopsy ----- Message ----- From: Nell Range Lab Results In Sent: 02/25/2023  10:36 AM EDT To: Malen Gauze, MD

## 2023-03-15 NOTE — Telephone Encounter (Signed)
 Patient called and made aware of psa results along with Dr. Dimas Millin recommendations.  Prostate biopsy instruction went over with patient via phone and sent via mail.  Orders placed and biopsy scheduled

## 2023-04-08 ENCOUNTER — Ambulatory Visit: Payer: Medicare HMO | Admitting: Urology

## 2023-04-13 ENCOUNTER — Encounter (HOSPITAL_COMMUNITY): Payer: Self-pay

## 2023-04-13 ENCOUNTER — Other Ambulatory Visit: Payer: Self-pay | Admitting: Urology

## 2023-04-13 ENCOUNTER — Ambulatory Visit: Payer: Medicare HMO | Admitting: Family Medicine

## 2023-04-13 ENCOUNTER — Ambulatory Visit (HOSPITAL_BASED_OUTPATIENT_CLINIC_OR_DEPARTMENT_OTHER): Admitting: Urology

## 2023-04-13 ENCOUNTER — Ambulatory Visit (HOSPITAL_COMMUNITY)
Admission: RE | Admit: 2023-04-13 | Discharge: 2023-04-13 | Disposition: A | Discharge status: Home / Self Care | Source: Ambulatory Visit | Attending: Urology | Admitting: Urology

## 2023-04-13 VITALS — BP 141/73 | HR 76 | Temp 98.1°F | Resp 18

## 2023-04-13 DIAGNOSIS — R972 Elevated prostate specific antigen [PSA]: Secondary | ICD-10-CM

## 2023-04-13 DIAGNOSIS — D075 Carcinoma in situ of prostate: Secondary | ICD-10-CM | POA: Diagnosis not present

## 2023-04-13 DIAGNOSIS — C61 Malignant neoplasm of prostate: Secondary | ICD-10-CM | POA: Diagnosis not present

## 2023-04-13 MED ORDER — LIDOCAINE HCL (PF) 2 % IJ SOLN
INTRAMUSCULAR | Status: AC
Start: 2023-04-13 — End: ?
  Filled 2023-04-13: qty 10

## 2023-04-13 MED ORDER — GENTAMICIN SULFATE 40 MG/ML IJ SOLN
INTRAMUSCULAR | Status: AC
  Filled 2023-04-13: qty 2

## 2023-04-13 MED ORDER — GENTAMICIN SULFATE 40 MG/ML IJ SOLN
80.0000 mg | Freq: Once | INTRAMUSCULAR | Status: AC
  Administered 2023-04-13: 80 mg via INTRAMUSCULAR

## 2023-04-13 MED ORDER — LIDOCAINE HCL (PF) 2 % IJ SOLN
10.0000 mL | Freq: Once | INTRAMUSCULAR | Status: AC
  Administered 2023-04-13: 10 mL

## 2023-04-13 NOTE — Progress Notes (Signed)
 PT tolerated prostate biopsy procedure and antibiotic injection well today. Labs obtained and sent for pathology by lexi from ultrasound. PT ambulatory at discharge with no acute distress noted and verbalized understanding of discharge instructions. PT to follow up with urologist as scheduled on 04/27/23 @3 :30.

## 2023-04-14 LAB — SURGICAL PATHOLOGY

## 2023-04-27 ENCOUNTER — Encounter: Payer: Self-pay | Admitting: Urology

## 2023-04-27 ENCOUNTER — Ambulatory Visit (INDEPENDENT_AMBULATORY_CARE_PROVIDER_SITE_OTHER): Admitting: Urology

## 2023-04-27 VITALS — BP 143/70 | HR 82

## 2023-04-27 DIAGNOSIS — C61 Malignant neoplasm of prostate: Secondary | ICD-10-CM

## 2023-04-27 NOTE — Progress Notes (Signed)
 Prostate Biopsy Procedure   Informed consent was obtained after discussing risks/benefits of the procedure.  A time out was performed to ensure correct patient identity.  Pre-Procedure: - Last PSA Level:  Lab Results  Component Value Date   PSA 2.0 03/28/2014   PSA 2.0 06/15/2013   - Gentamicin  given prophylactically - Levaquin  500 mg administered PO -Transrectal Ultrasound performed revealing a 58.2 gm prostate -No significant hypoechoic or median lobe noted  Procedure: - Prostate block performed using 10 cc 1% lidocaine  and biopsies taken from sextant areas, a total of 12 under ultrasound guidance.  Post-Procedure: - Patient tolerated the procedure well - He was counseled to seek immediate medical attention if experiences any severe pain, significant bleeding, or fevers - Return in one week to discuss biopsy results

## 2023-04-27 NOTE — Patient Instructions (Signed)
 Transrectal Ultrasound-Guided Prostate Biopsy, Care After What can I expect after the procedure? After the procedure, it is common to have: Pain and discomfort near your butt (rectum), especially while sitting. Pink-colored pee (urine). This is due to small amounts of blood in your pee. A burning feeling while peeing. Blood in your poop (stool). Bleeding from your butt. Blood in your semen. Follow these instructions at home: Medicines Take over-the-counter and prescription medicines only as told by your doctor. If you were given a sedative during your procedure, do not drive or use machines until your doctor says that it is safe. A sedative is a medicine that helps you relax. If you were prescribed an antibiotic medicine, take it as told by your doctor. Do not stop taking it even if you start to feel better. Activity  Return to your normal activities when your doctor says that it is safe. Ask your doctor when it is okay for you to have sex. You may have to avoid lifting. Ask your doctor how much you can safely lift. General instructions  Drink enough water to keep your pee pale yellow. Watch your pee, poop, and semen for new bleeding or bleeding that gets worse. Keep all follow-up visits. Contact a doctor if: You have any of these: Blood clots in your pee or poop. Blood in your pee more than 2 weeks after the procedure. Blood in your semen more than 2 months after the procedure. New or worse bleeding in your pee, poop, or semen. Very bad belly pain. Your pee smells bad or unusual. You have trouble peeing. Your lower belly feels firm. You have problems getting an erection. You feel like you may vomit (are nauseous), or you vomit. Get help right away if: You have a fever or chills. You have bright red pee. You have very bad pain that does not get better with medicine. You cannot pee. Summary After this procedure, it is common to have pain and discomfort near your butt,  especially while sitting. You may have blood in your pee and poop. It is common to have blood in your semen. Get help right away if you have a fever or chills. This information is not intended to replace advice given to you by your health care provider. Make sure you discuss any questions you have with your health care provider. Document Revised: 06/16/2020 Document Reviewed: 06/16/2020 Elsevier Patient Education  2024 [employer].

## 2023-04-27 NOTE — Progress Notes (Signed)
 04/27/2023 3:36 PM   Paul Williams [birth year] [id number]  Referring provider: Dettinger, Lucio Sabin, MD [address] [place],  [place] [address]  Followup prostate biopsy   HPI: Paul Williams is a 72yo here for followup after prostate biopsy. Biopsy revealed 3+4=7 in 2/12 cores and Gleason 3+3=6 in 1/12 cores. PSA 7.4.   PMH: Past Medical History:  Diagnosis Date   Adenomatous polyp of colon    Diabetes mellitus without complication (HCC)    Diverticulosis    Hyperlipidemia    Hypertension    Internal hemorrhoids     Surgical History: Past Surgical History:  Procedure Laterality Date   COLONOSCOPY     POLYPECTOMY     top plate of teeth extracted      Home Medications:  Allergies as of 04/27/2023   No Known Allergies      Medication List        Accurate as of April 27, 2023  3:36 PM. If you have any questions, ask your nurse or doctor.          allopurinol  100 MG tablet Commonly known as: ZYLOPRIM  Take 0.5 tablets (50 mg total) by mouth daily.   aspirin EC 81 MG tablet Take 81 mg by mouth daily.   atorvastatin  40 MG tablet Commonly known as: LIPITOR Take 1 tablet (40 mg total) by mouth daily.   DropSafe Alcohol Prep 70 % Pads Test BS BID Dx E11.22   fish oil-omega-3 fatty acids 1000 MG capsule Take 1 g by mouth daily.   labetalol 200 MG tablet Commonly known as: NORMODYNE Take 200 mg by mouth 2 (two) times daily.   levofloxacin  750 MG tablet Commonly known as: Levaquin  Take 1 tablet (750 mg total) by mouth daily. Take 1 hour prior to your prostate biopsy procedure.   Mitigare  0.6 MG Caps Generic drug: Colchicine  2 tablets at pain onset, may repeat x1in 1 hour. No more then 3 tablets in 24 hours   multivitamin tablet Take 1 tablet by mouth daily.   OVER THE COUNTER MEDICATION Take 250 mg by mouth 2 (two) times daily. Super beta prostate   sildenafil  100 MG tablet Commonly known as: VIAGRA  Take 0.5 tablets (50 mg total) by mouth as needed for  erectile dysfunction.   sitaGLIPtin  100 MG tablet Commonly known as: Januvia  Take 1 tablet (100 mg total) by mouth daily.   telmisartan 20 MG tablet Commonly known as: MICARDIS Take 20 mg by mouth daily.   True Metrix Air Glucose Meter w/Device Kit Test BS BID Dx E11.22   True Metrix Blood Glucose Test test strip Generic drug: glucose blood Test BS BID Dx E11.22   True Metrix Level 1 Low Soln Use w/ glucose monitor Dx E11.22   TRUEplus Lancets 33G Misc Test BS BID Dx E11.22        Allergies: No Known Allergies  Family History: Family History  Problem Relation Age of Onset   Stroke Mother    Diabetes Father    Prostate cancer Father        prostate   Diabetes Sister    Diabetes Sister    Colon cancer Neg Hx    Esophageal cancer Neg Hx    Rectal cancer Neg Hx    Stomach cancer Neg Hx    Colon polyps Neg Hx     Social History:  reports that he has never smoked. He has never used smokeless tobacco. He reports that he does not drink alcohol and does not  use drugs.  ROS: All other review of systems were reviewed and are negative except what is noted above in HPI  Physical Exam: BP (!) 143/70   Pulse 82   Constitutional:  Alert and oriented, No acute distress. HEENT: [place] AT, moist mucus membranes.  Trachea midline, no masses. Cardiovascular: No clubbing, cyanosis, or edema. Respiratory: Normal respiratory effort, no increased work of breathing. GI: Abdomen is soft, nontender, nondistended, no abdominal masses GU: No CVA tenderness.  Lymph: No cervical or inguinal lymphadenopathy. Skin: No rashes, bruises or suspicious lesions. Neurologic: Grossly intact, no focal deficits, moving all 4 extremities. Psychiatric: Normal mood and affect.  Laboratory Data: Lab Results  Component Value Date   WBC 7.4 01/12/2023   HGB 12.7 (L) 01/12/2023   HCT 38.9 01/12/2023   MCV 90 01/12/2023   PLT 284 01/12/2023    Lab Results  Component Value Date   CREATININE 1.58  (H) 01/12/2023    Lab Results  Component Value Date   PSA 2.0 03/28/2014   PSA 2.0 06/15/2013    No results found for: "TESTOSTERONE"  Lab Results  Component Value Date   HGBA1C 6.9 (H) 01/12/2023    Urinalysis    Component Value Date/Time   APPEARANCEUR Clear 02/25/2023 0901   GLUCOSEU Negative 02/25/2023 0901   BILIRUBINUR Negative 02/25/2023 0901   PROTEINUR Trace 02/25/2023 0901   NITRITE Negative 02/25/2023 0901   LEUKOCYTESUR Negative 02/25/2023 0901    Lab Results  Component Value Date   LABMICR Comment 02/25/2023    Pertinent Imaging:  No results found for this or any previous visit.  No results found for this or any previous visit.  No results found for this or any previous visit.  No results found for this or any previous visit.  Results for orders placed during the hospital encounter of 01/18/17  US  RENAL  Narrative CLINICAL DATA:  73 year old hypertensive diabetic male with chronic kidney disease stage 3. Initial encounter.  EXAM: RENAL / URINARY TRACT ULTRASOUND COMPLETE  COMPARISON:  05/19/2016 CT.  FINDINGS: Right Kidney:  Length: 10.8 cm. Echogenicity within normal limits. No hydronephrosis. Lower pole 2.6 x 2.8 x 2.7 cm cyst.  Left Kidney:  Length: 11 cm. Echogenicity within normal limits. No mass or hydronephrosis visualized.  Bladder:  Under distended. Circumferential wall thickening may be related to under distension.  IMPRESSION: No hydronephrosis.  Right lower pole 2.8 cm cyst.  Under distended urinary bladder.   Electronically Signed By: Lindon Rhine M.D. On: 01/18/2017 19:34  No results found for this or any previous visit.  No results found for this or any previous visit.  No results found for this or any previous visit.   Assessment & Plan:    1. Prostate cancer (HCC) (Primary) I discussed the natural history of intermediate risk prostate cancer with the patient and the various treatment options  including active surveillance, RALP, IMRT, brachytherapy, cryotherapy, HIFU and ADT. After discussing the options the patient elects for radiation therapy. I will refer him to Dr. Lorri Rota for consideration of IMRT versus brachytherapy   No follow-ups on file.  Johnie Nailer, MD  [facility] [place]

## 2023-04-27 NOTE — Patient Instructions (Signed)

## 2023-05-02 NOTE — Progress Notes (Signed)
 GU Location of Tumor / Histology: Prostate Ca  If Prostate Cancer, Gleason Score is (3 + 4) and PSA is (6.5 on 02/25/2023)  PSA 7.4 on 01/12/2023  [facility] Doctor Paul Williams presented as referral from Dr. Johnie Nailer (Elevated PSA).  Biopsies     Past/Anticipated interventions by urology, if any: NA  Past/Anticipated interventions by medical oncology, if any: NA  Weight changes, if any: No  IPSS:  6 SHIM:  6  Bowel/Bladder complaints, if any:  No  Nausea/Vomiting, if any: No  Pain issues, if any:   0/10  SAFETY ISSUES: Prior radiation? No Pacemaker/ICD? No Possible current pregnancy? Male Is the patient on methotrexate? No  Current Complaints / other details:

## 2023-05-05 DIAGNOSIS — C61 Malignant neoplasm of prostate: Secondary | ICD-10-CM

## 2023-05-05 HISTORY — DX: Malignant neoplasm of prostate: C61

## 2023-05-05 NOTE — Progress Notes (Signed)
 Radiation Oncology         [phone] ________________________________  Initial Outpatient Consultation  Name: Paul Williams MRN: [id number]  Date: 05/06/2023  DOB: 12/10/1950  [id number], Lucio Sabin, MD  McKenzie, Arden Beck, MD   REFERRING PHYSICIAN: Marco Severs, MD  DIAGNOSIS: 73 y.o. gentleman with Stage T1c adenocarcinoma of the prostate with Gleason score of 3+4, and PSA of 6.5.    ICD-10-CM   1. Malignant neoplasm of prostate (HCC)  C61       HISTORY OF PRESENT ILLNESS: Paul Williams is a 73 y.o. male with a diagnosis of prostate cancer. He was noted to have an elevated PSA of 7.4 by his primary care physician, Dr. Steen Eden.  Accordingly, he was referred for evaluation in urology by Dr. Claretta Croft on 02/25/23,  digital rectal examination performed at that time showed no nodules or induration. A repeat PSA obtained that day showed a slight decrease but remained elevated at 6.5. Therefore, the patient proceeded to transrectal ultrasound with 12 biopsies of the prostate on 04/13/23.  The prostate volume measured 58.2 cc.  Out of 12 core biopsies, 3 were positive.  The maximum Gleason score was 3+4, and this was seen in the right base and right base lateral. Additionally, Gleason 3+3 was seen in the right apex lateral. Of note, all positive cores were involved by only 5-10%.  The patient reviewed the biopsy results with his urologist and he has kindly been referred today for discussion of potential radiation treatment options.   PREVIOUS RADIATION THERAPY: No  PAST MEDICAL HISTORY:  Past Medical History:  Diagnosis Date   Adenomatous polyp of colon    Diabetes mellitus without complication (HCC)    Diverticulosis    Elevated PSA    Hyperlipidemia    Hypertension    Internal hemorrhoids       PAST SURGICAL HISTORY: Past Surgical History:  Procedure Laterality Date   COLONOSCOPY     POLYPECTOMY     PROSTATE BIOPSY     top plate of teeth extracted      FAMILY HISTORY:   Family History  Problem Relation Age of Onset   Stroke Mother    Diabetes Father    Prostate cancer Father        prostate   Diabetes Sister    Diabetes Sister    Colon cancer Neg Hx    Esophageal cancer Neg Hx    Rectal cancer Neg Hx    Stomach cancer Neg Hx    Colon polyps Neg Hx     SOCIAL HISTORY: He has one daughter and lives in [place], [place] with his wife of over 50 years (high school sweethearts). Social History   Socioeconomic History   Marital status: Married    Spouse name: Arlene   Number of children: 1   Years of education: Not on file   Highest education level: Not on file  Occupational History   Occupation: retired    Associate Professor: [employer]  Tobacco Use   Smoking status: Never   Smokeless tobacco: Never  Vaping Use   Vaping status: Never Used  Substance and Sexual Activity   Alcohol use: Never   Drug use: No   Sexual activity: Yes    Partners: Female  Other Topics Concern   Not on file  Social History Narrative   Lives home with wife. They have one daughter in [place].   Social Drivers of Health   Financial Resource Strain: Low Risk  (07/21/2022)  Overall Financial Resource Strain (CARDIA)    Difficulty of Paying Living Expenses: Not hard at all  Food Insecurity: No Food Insecurity (05/06/2023)   Hunger Vital Sign    Worried About Running Out of Food in the Last Year: Never true    Ran Out of Food in the Last Year: Never true  Transportation Needs: No Transportation Needs (05/06/2023)   PRAPARE - Administrator, Civil Service (Medical): No    Lack of Transportation (Non-Medical): No  Physical Activity: Sufficiently Active (07/21/2022)   Exercise Vital Sign    Days of Exercise per Week: 7 days    Minutes of Exercise per Session: 30 min  Stress: No Stress Concern Present (07/21/2022)   [employer] of Occupational Health - Occupational Stress Questionnaire    Feeling of Stress : Not at all  Social Connections: Socially  Integrated (07/21/2022)   Social Connection and Isolation Panel [NHANES]    Frequency of Communication with Friends and Family: More than three times a week    Frequency of Social Gatherings with Friends and Family: More than three times a week    Attends Religious Services: More than 4 times per year    Active Member of [employer] or Organizations: Yes    Attends Banker Meetings: More than 4 times per year    Marital Status: Married  Catering manager Violence: Not At Risk (05/06/2023)   Humiliation, Afraid, Rape, and Kick questionnaire    Fear of Current or Ex-Partner: No    Emotionally Abused: No    Physically Abused: No    Sexually Abused: No    ALLERGIES: Patient has no known allergies.  MEDICATIONS:  Current Outpatient Medications  Medication Sig Dispense Refill   K-PHOS 500 MG tablet Take 500 mg by mouth 2 (two) times daily.     Alcohol Swabs (DROPSAFE ALCOHOL PREP) 70 % PADS Test BS BID Dx E11.22 200 each 3   allopurinol  (ZYLOPRIM ) 100 MG tablet Take 0.5 tablets (50 mg total) by mouth daily. 45 tablet 3   aspirin EC 81 MG tablet Take 81 mg by mouth daily.     atorvastatin  (LIPITOR) 40 MG tablet Take 1 tablet (40 mg total) by mouth daily. 90 tablet 3   Blood Glucose Calibration (TRUE METRIX LEVEL 1) Low SOLN Use w/ glucose monitor Dx E11.22 3 each 2   Blood Glucose Monitoring Suppl (TRUE METRIX AIR GLUCOSE METER) w/Device KIT Test BS BID Dx E11.22 1 kit 0   fish oil-omega-3 fatty acids 1000 MG capsule Take 1 g by mouth daily.     glucose blood (TRUE METRIX BLOOD GLUCOSE TEST) test strip Test BS BID Dx E11.22 200 strip 3   labetalol (NORMODYNE) 200 MG tablet Take 200 mg by mouth 2 (two) times daily.     MITIGARE  0.6 MG CAPS 2 tablets at pain onset, may repeat x1in 1 hour. No more then 3 tablets in 24 hours 30 capsule 3   Multiple Vitamin (MULTIVITAMIN) tablet Take 1 tablet by mouth daily.     OVER THE COUNTER MEDICATION Take 250 mg by mouth 2 (two) times daily. Super beta  prostate     sildenafil  (VIAGRA ) 100 MG tablet Take 0.5 tablets (50 mg total) by mouth as needed for erectile dysfunction. 10 tablet 0   sitaGLIPtin  (JANUVIA ) 100 MG tablet Take 1 tablet (100 mg total) by mouth daily. 90 tablet 3   telmisartan (MICARDIS) 20 MG tablet Take 20 mg by mouth daily.  TRUEplus Lancets 33G MISC Test BS BID Dx E11.22 200 each 3   No current facility-administered medications for this encounter.    REVIEW OF SYSTEMS:  On review of systems, the patient reports that he is doing well overall. He denies any chest pain, shortness of breath, cough, fevers, chills, night sweats, unintended weight changes. He denies any bowel disturbances, and denies abdominal pain, nausea or vomiting. He denies any new musculoskeletal or joint aches or pains. His IPSS was 6, indicating mild urinary symptoms. His SHIM was 6, indicating he has severe erectile dysfunction but reports that this is not a priority for him or his wife. A complete review of systems is obtained and is otherwise negative.  PHYSICAL EXAM:  Wt Readings from Last 3 Encounters:  05/06/23 233 lb 3.2 oz (105.8 kg)  02/25/23 236 lb (107 kg)  01/12/23 236 lb (107 kg)   Temp Readings from Last 3 Encounters:  05/06/23 (!) 97 F (36.1 C) (Oral)  04/13/23 98.1 F (36.7 C) (Oral)  10/04/22 (!) 97 F (36.1 C) (Oral)   BP Readings from Last 3 Encounters:  05/06/23 (!) 141/73  04/27/23 (!) 143/70  04/13/23 (!) 141/73   Pulse Readings from Last 3 Encounters:  05/06/23 75  04/27/23 82  04/13/23 76   Pain Assessment Pain Score: 0-No pain/10  In general this is a well appearing African American male in no acute distress. He's alert and oriented x4 and appropriate throughout the examination. Cardiopulmonary assessment is negative for acute distress, and he exhibits normal effort.     KPS = 100  100 - Normal; no complaints; no evidence of disease. 90   - Able to carry on normal activity; minor signs or symptoms of  disease. 80   - Normal activity with effort; some signs or symptoms of disease. 65   - Cares for self; unable to carry on normal activity or to do active work. 60   - Requires occasional assistance, but is able to care for most of his personal needs. 50   - Requires considerable assistance and frequent medical care. 40   - Disabled; requires special care and assistance. 30   - Severely disabled; hospital admission is indicated although death not imminent. 20   - Very sick; hospital admission necessary; active supportive treatment necessary. 10   - Moribund; fatal processes progressing rapidly. 0     - Dead  Karnofsky DA, Abelmann WH, Craver LS and Burchenal JH [phone]) The use of the nitrogen mustards in the palliative treatment of carcinoma: with particular reference to bronchogenic carcinoma Cancer 1 [phone]  LABORATORY DATA:  Lab Results  Component Value Date   WBC 7.4 01/12/2023   HGB 12.7 (L) 01/12/2023   HCT 38.9 01/12/2023   MCV 90 01/12/2023   PLT 284 01/12/2023   Lab Results  Component Value Date   NA 141 01/12/2023   K 4.2 01/12/2023   CL 104 01/12/2023   CO2 25 01/12/2023   Lab Results  Component Value Date   ALT 26 01/12/2023   AST 32 01/12/2023   ALKPHOS 91 01/12/2023   BILITOT 0.5 01/12/2023     RADIOGRAPHY: US  Guided Needle Placement Result Date: 04/30/2023 CLINICAL DATA:  Prostate biopsy. EXAM: IR ULTRASOUND GUIDED ASPIRATION/DRAINAGE TECHNIQUE: Ultrasound guidance provided for prostate biopsy. COMPARISON:  None Available. FINDINGS: See above. IMPRESSION: Ultrasound guidance provided for prostate biopsy. Electronically Signed   By: Erica Hau M.D.   On: 04/30/2023 12:01   US  Transrectal Complete Result Date:  04/13/2023 Please see Notes tab for imaging impression.  US  PROSTATE BIOPSY MULTIPLE Result Date: 04/13/2023 Please see Notes tab for imaging impression.     IMPRESSION/PLAN: 1. 73 y.o. gentleman with Stage T1c adenocarcinoma of the prostate with  Gleason Score of 3+4, and PSA of 6.5. We discussed the patient's workup and outlined the nature of prostate cancer in this setting. The patient's T stage, Gleason's score, and PSA put him into the favorable intermediate risk group. Accordingly, he is eligible for a variety of potential treatment options including active surveillance, brachytherapy, 5.5 weeks of external radiation, or prostatectomy. We discussed the available radiation techniques, and focused on the details and logistics of delivery. We discussed and outlined the risks, benefits, short and long-term effects associated with radiotherapy and compared and contrasted these with prostatectomy. We discussed the role of SpaceOAR gel in reducing the rectal toxicity associated with radiotherapy. He appears to have a good understanding of his disease and our treatment recommendations which are of curative intent.  He was encouraged to ask questions that were answered to his stated satisfaction.  At the conclusion of our conversation, the patient is interested in moving forward with brachytherapy and use of SpaceOAR gel to reduce rectal toxicity from radiotherapy.  We will share our discussion with Dr. Claretta Croft and move forward with scheduling his CT [facility] planning appointment in the near future.  The patient met briefly with Darlin Ehrlich in our office who will be working closely with him to coordinate OR scheduling and pre and post procedure appointments.  We will contact the pharmaceutical rep to ensure that SpaceOAR is available at the time of procedure.  We enjoyed meeting him today and look forward to continuing to participate in his care.  We personally spent 70 minutes in this encounter including chart review, reviewing radiological studies, meeting face-to-face with the patient, entering orders and completing documentation.    Arta Bihari, PA-C    Kenith Payer, MD  [facility]  Radiation Oncology Direct Dial: [phone]  Fax:  [phone] [email or web address]  Skype  LinkedIn   This document serves as a record of services personally performed by Kenith Payer, MD and Keitha Pata, PA-C. It was created on their behalf by Florance Hun, a trained medical scribe. The creation of this record is based on the scribe's personal observations and the provider's statements to them. This document has been checked and approved by the attending provider.

## 2023-05-06 ENCOUNTER — Encounter: Payer: Self-pay | Admitting: Urology

## 2023-05-06 ENCOUNTER — Encounter: Payer: Self-pay | Admitting: Radiation Oncology

## 2023-05-06 ENCOUNTER — Ambulatory Visit
Admission: RE | Admit: 2023-05-06 | Discharge: 2023-05-06 | Disposition: A | Discharge status: Home / Self Care | Source: Ambulatory Visit | Attending: Radiation Oncology | Admitting: Radiation Oncology

## 2023-05-06 VITALS — BP 141/73 | HR 75 | Temp 97.0°F | Resp 18 | Ht 67.0 in | Wt 233.2 lb

## 2023-05-06 DIAGNOSIS — E785 Hyperlipidemia, unspecified: Secondary | ICD-10-CM | POA: Diagnosis not present

## 2023-05-06 DIAGNOSIS — C61 Malignant neoplasm of prostate: Secondary | ICD-10-CM

## 2023-05-06 DIAGNOSIS — I10 Essential (primary) hypertension: Secondary | ICD-10-CM | POA: Insufficient documentation

## 2023-05-06 DIAGNOSIS — Z8719 Personal history of other diseases of the digestive system: Secondary | ICD-10-CM | POA: Insufficient documentation

## 2023-05-06 DIAGNOSIS — Z79899 Other long term (current) drug therapy: Secondary | ICD-10-CM | POA: Diagnosis not present

## 2023-05-06 DIAGNOSIS — Z7984 Long term (current) use of oral hypoglycemic drugs: Secondary | ICD-10-CM | POA: Diagnosis not present

## 2023-05-06 DIAGNOSIS — Z860101 Personal history of adenomatous and serrated colon polyps: Secondary | ICD-10-CM | POA: Insufficient documentation

## 2023-05-06 DIAGNOSIS — E119 Type 2 diabetes mellitus without complications: Secondary | ICD-10-CM | POA: Insufficient documentation

## 2023-05-06 DIAGNOSIS — Z191 Hormone sensitive malignancy status: Secondary | ICD-10-CM | POA: Diagnosis not present

## 2023-05-06 DIAGNOSIS — Z7982 Long term (current) use of aspirin: Secondary | ICD-10-CM | POA: Insufficient documentation

## 2023-05-06 DIAGNOSIS — Z8042 Family history of malignant neoplasm of prostate: Secondary | ICD-10-CM | POA: Insufficient documentation

## 2023-05-06 HISTORY — DX: Elevated prostate specific antigen (PSA): R97.20

## 2023-05-07 DIAGNOSIS — C61 Malignant neoplasm of prostate: Secondary | ICD-10-CM | POA: Diagnosis not present

## 2023-05-07 DIAGNOSIS — Z191 Hormone sensitive malignancy status: Secondary | ICD-10-CM | POA: Diagnosis not present

## 2023-05-09 ENCOUNTER — Telehealth: Payer: Self-pay | Admitting: *Deleted

## 2023-05-09 NOTE — Telephone Encounter (Signed)
 CALLED PATIENT TO INFORM OF PRE-SEED APPTS. AND IMPLANT DATE, SPOKE WITH PATIENT AND HE IS AWARE OF THESE APPTS.

## 2023-05-10 ENCOUNTER — Other Ambulatory Visit: Payer: Self-pay | Admitting: Urology

## 2023-05-10 DIAGNOSIS — C61 Malignant neoplasm of prostate: Secondary | ICD-10-CM

## 2023-05-16 ENCOUNTER — Ambulatory Visit (INDEPENDENT_AMBULATORY_CARE_PROVIDER_SITE_OTHER): Admitting: Family Medicine

## 2023-05-16 ENCOUNTER — Encounter: Payer: Self-pay | Admitting: Family Medicine

## 2023-05-16 VITALS — BP 156/90 | HR 66 | Temp 97.9°F | Ht 65.0 in | Wt 237.2 lb

## 2023-05-16 DIAGNOSIS — N183 Chronic kidney disease, stage 3 unspecified: Secondary | ICD-10-CM

## 2023-05-16 DIAGNOSIS — E1159 Type 2 diabetes mellitus with other circulatory complications: Secondary | ICD-10-CM | POA: Diagnosis not present

## 2023-05-16 DIAGNOSIS — E785 Hyperlipidemia, unspecified: Secondary | ICD-10-CM

## 2023-05-16 DIAGNOSIS — Z7984 Long term (current) use of oral hypoglycemic drugs: Secondary | ICD-10-CM

## 2023-05-16 DIAGNOSIS — E1122 Type 2 diabetes mellitus with diabetic chronic kidney disease: Secondary | ICD-10-CM

## 2023-05-16 DIAGNOSIS — M1A0720 Idiopathic chronic gout, left ankle and foot, without tophus (tophi): Secondary | ICD-10-CM

## 2023-05-16 DIAGNOSIS — R972 Elevated prostate specific antigen [PSA]: Secondary | ICD-10-CM | POA: Diagnosis not present

## 2023-05-16 DIAGNOSIS — I152 Hypertension secondary to endocrine disorders: Secondary | ICD-10-CM | POA: Diagnosis not present

## 2023-05-16 DIAGNOSIS — N1831 Chronic kidney disease, stage 3a: Secondary | ICD-10-CM

## 2023-05-16 DIAGNOSIS — E1169 Type 2 diabetes mellitus with other specified complication: Secondary | ICD-10-CM | POA: Diagnosis not present

## 2023-05-16 LAB — BAYER DCA HB A1C WAIVED: HB A1C (BAYER DCA - WAIVED): 7 % — ABNORMAL HIGH (ref 4.8–5.6)

## 2023-05-16 MED ORDER — ATORVASTATIN CALCIUM 40 MG PO TABS: 40.0000 mg | ORAL_TABLET | Freq: Every day | ORAL | 3 refills | Status: AC

## 2023-05-16 MED ORDER — ALLOPURINOL 100 MG PO TABS: 50.0000 mg | ORAL_TABLET | Freq: Every day | ORAL | 3 refills | Status: AC

## 2023-05-16 MED ORDER — SITAGLIPTIN PHOSPHATE 100 MG PO TABS: 100.0000 mg | ORAL_TABLET | Freq: Every day | ORAL | 3 refills | Status: AC

## 2023-05-16 NOTE — Progress Notes (Signed)
 BP (!) 156/90   Pulse 66   Temp 97.9 F (36.6 C)   Ht 5\' 5"  (1.651 m)   Wt 237 lb 3.2 oz (107.6 kg)   SpO2 98%   BMI 39.47 kg/m    Subjective:   Patient ID: Paul Williams, male    DOB: 1950-06-24, 73 y.o.   MRN: [id number]  HPI: Paul Williams is a 73 y.o. male presenting on 05/16/2023 for Medical Management of Chronic Issues   HPI Type 2 diabetes mellitus Patient comes in today for recheck of his diabetes. Patient has been currently taking Januvia . Patient is currently on an ACE inhibitor/ARB. Patient has not seen an ophthalmologist this year. Patient denies any new issues with their feet. The symptom started onset as an adult hypertension and hyperlipidemia ARE RELATED TO DM   Hypertension Patient is currently on labetalol and telmisartan, and their blood pressure today is 150/88. Patient denies any lightheadedness or dizziness. Patient denies headaches, blurred vision, chest pains, shortness of breath, or weakness. Denies any side effects from medication and is content with current medication.   Hyperlipidemia Patient is coming in for recheck of his hyperlipidemia. The patient is currently taking fish oils and atorvastatin . They deny any issues with myalgias or history of liver damage from it. They deny any focal numbness or weakness or chest pain.   Prostate cancer Patient has just been diagnosed with prostate cancer a month ago.  They are discussing with him options and treatment plans including radioactive seed implants.  Relevant past medical, surgical, family and social history reviewed and updated as indicated. Interim medical history since our last visit reviewed. Allergies and medications reviewed and updated.  Review of Systems  Constitutional:  Negative for chills and fever.  Eyes:  Negative for visual disturbance.  Respiratory:  Negative for shortness of breath and wheezing.   Cardiovascular:  Negative for chest pain and leg swelling.  Musculoskeletal:  Negative for  back pain and gait problem.  Skin:  Negative for rash.  Neurological:  Negative for dizziness and light-headedness.  All other systems reviewed and are negative.   Per HPI unless specifically indicated above   Allergies as of 05/16/2023   No Known Allergies      Medication List        Accurate as of May 16, 2023  2:38 PM. If you have any questions, ask your nurse or doctor.          allopurinol  100 MG tablet Commonly known as: ZYLOPRIM  Take 0.5 tablets (50 mg total) by mouth daily.   aspirin EC 81 MG tablet Take 81 mg by mouth daily.   atorvastatin  40 MG tablet Commonly known as: LIPITOR Take 1 tablet (40 mg total) by mouth daily.   DropSafe Alcohol Prep 70 % Pads Test BS BID Dx E11.22   fish oil-omega-3 fatty acids 1000 MG capsule Take 1 g by mouth daily.   K-Phos 500 MG tablet Generic drug: potassium phosphate (monobasic) Take 500 mg by mouth 2 (two) times daily.   labetalol 200 MG tablet Commonly known as: NORMODYNE Take 200 mg by mouth 2 (two) times daily.   Mitigare  0.6 MG Caps Generic drug: Colchicine  2 tablets at pain onset, may repeat x1in 1 hour. No more then 3 tablets in 24 hours   multivitamin tablet Take 1 tablet by mouth daily.   OVER THE COUNTER MEDICATION Take 250 mg by mouth 2 (two) times daily. Super beta prostate   sildenafil  100 MG tablet Commonly known  as: VIAGRA  Take 0.5 tablets (50 mg total) by mouth as needed for erectile dysfunction.   sitaGLIPtin  100 MG tablet Commonly known as: Januvia  Take 1 tablet (100 mg total) by mouth daily.   telmisartan 20 MG tablet Commonly known as: MICARDIS Take 20 mg by mouth daily.   True Metrix Air Glucose Meter w/Device Kit Test BS BID Dx E11.22   True Metrix Blood Glucose Test test strip Generic drug: glucose blood Test BS BID Dx E11.22   True Metrix Level 1 Low Soln Use w/ glucose monitor Dx E11.22   TRUEplus Lancets 33G Misc Test BS BID Dx E11.22         Objective:    BP (!) 156/90   Pulse 66   Temp 97.9 F (36.6 C)   Ht 5\' 5"  (1.651 m)   Wt 237 lb 3.2 oz (107.6 kg)   SpO2 98%   BMI 39.47 kg/m   Wt Readings from Last 3 Encounters:  05/16/23 237 lb 3.2 oz (107.6 kg)  05/06/23 233 lb 3.2 oz (105.8 kg)  02/25/23 236 lb (107 kg)    Physical Exam Vitals and nursing note reviewed.  Constitutional:      General: He is not in acute distress.    Appearance: He is well-developed. He is not diaphoretic.  Eyes:     General: No scleral icterus.    Conjunctiva/sclera: Conjunctivae normal.  Neck:     Thyroid : No thyromegaly.  Cardiovascular:     Rate and Rhythm: Normal rate and regular rhythm.     Heart sounds: Normal heart sounds. No murmur heard. Pulmonary:     Effort: Pulmonary effort is normal. No respiratory distress.     Breath sounds: Normal breath sounds. No wheezing or rhonchi.  Musculoskeletal:        General: Normal range of motion.     Cervical back: Neck supple.  Lymphadenopathy:     Cervical: No cervical adenopathy.  Skin:    General: Skin is warm and dry.     Findings: No rash.  Neurological:     Mental Status: He is alert and oriented to person, place, and time.     Coordination: Coordination normal.  Psychiatric:        Behavior: Behavior normal.       Assessment & Plan:   Problem List Items Addressed This Visit       Cardiovascular and Mediastinum   Hypertension associated with diabetes (HCC)   Relevant Medications   atorvastatin  (LIPITOR) 40 MG tablet   sitaGLIPtin  (JANUVIA ) 100 MG tablet   Other Relevant Orders   CBC with Differential/Platelet   CMP14+EGFR     Endocrine   Hyperlipidemia associated with type 2 diabetes mellitus (HCC)   Relevant Medications   atorvastatin  (LIPITOR) 40 MG tablet   sitaGLIPtin  (JANUVIA ) 100 MG tablet   Other Relevant Orders   Lipid panel   Type 2 diabetes mellitus with diabetic chronic kidney disease (HCC) - Primary   Relevant Medications   atorvastatin  (LIPITOR) 40 MG  tablet   sitaGLIPtin  (JANUVIA ) 100 MG tablet   Other Relevant Orders   Bayer DCA Hb A1c Waived   Microalbumin / creatinine urine ratio     Genitourinary   Chronic kidney disease (CKD), stage III (moderate) (HCC)   Other Visit Diagnoses       Elevated PSA         Idiopathic chronic gout of left ankle without tophus       Relevant Medications   allopurinol  (  ZYLOPRIM ) 100 MG tablet     A1c was up a little bit at 7.0 but he is dealing with a lot of things right now so we will just focus on diet and maintaining nutrition at this point.  BP still elevated but with all of his prostate cancer stuff I think he is little more agitated and we will keep an eye on his blood pressure for now.  Follow up plan: Return in about 3 months (around 08/16/2023), or if symptoms worsen or fail to improve, for Diabetes recheck.  Counseling provided for all of the vaccine components Orders Placed This Encounter  Procedures   Bayer DCA Hb A1c Waived   CBC with Differential/Platelet   CMP14+EGFR   Lipid panel   Microalbumin / creatinine urine ratio    Jolyne Needs, MD [facility] 05/16/2023, 2:38 PM

## 2023-05-17 LAB — CMP14+EGFR
ALT: 23 IU/L (ref 0–44)
AST: 29 IU/L (ref 0–40)
Albumin: 3.9 g/dL (ref 3.9–4.9)
Alkaline Phosphatase: 100 IU/L (ref 44–121)
BUN/Creatinine Ratio: 7 — ABNORMAL LOW (ref 10–24)
BUN: 9 mg/dL (ref 8–27)
Bilirubin Total: 0.5 mg/dL (ref 0.0–1.2)
CO2: 24 mmol/L (ref 20–29)
Calcium: 9.5 mg/dL (ref 8.6–10.2)
Chloride: 107 mmol/L — ABNORMAL HIGH (ref 96–106)
Creatinine, Ser: 1.3 mg/dL — ABNORMAL HIGH (ref 0.76–1.27)
Globulin, Total: 2.4 g/dL (ref 1.5–4.5)
Glucose: 109 mg/dL — ABNORMAL HIGH (ref 70–99)
Potassium: 4.7 mmol/L (ref 3.5–5.2)
Sodium: 141 mmol/L (ref 134–144)
Total Protein: 6.3 g/dL (ref 6.0–8.5)
eGFR: 59 mL/min/{1.73_m2} — ABNORMAL LOW (ref >59)

## 2023-05-17 LAB — CBC WITH DIFFERENTIAL/PLATELET
Basophils Absolute: 0 10*3/uL (ref 0.0–0.2)
Basos: 0 %
EOS (ABSOLUTE): 0.3 10*3/uL (ref 0.0–0.4)
Eos: 3 %
Hematocrit: 41.2 % (ref 37.5–51.0)
Hemoglobin: 13.1 g/dL (ref 13.0–17.7)
Immature Grans (Abs): 0 10*3/uL (ref 0.0–0.1)
Immature Granulocytes: 0 %
Lymphocytes Absolute: 2.3 10*3/uL (ref 0.7–3.1)
Lymphs: 26 %
MCH: 28.4 pg (ref 26.6–33.0)
MCHC: 31.8 g/dL (ref 31.5–35.7)
MCV: 89 fL (ref 79–97)
Monocytes Absolute: 0.9 10*3/uL (ref 0.1–0.9)
Monocytes: 11 %
Neutrophils Absolute: 5.3 10*3/uL (ref 1.4–7.0)
Neutrophils: 60 %
Platelets: 241 10*3/uL (ref 150–450)
RBC: 4.62 x10E6/uL (ref 4.14–5.80)
RDW: 14 % (ref 11.6–15.4)
WBC: 8.8 10*3/uL (ref 3.4–10.8)

## 2023-05-17 LAB — LIPID PANEL
Chol/HDL Ratio: 1.8 ratio (ref 0.0–5.0)
Cholesterol, Total: 96 mg/dL — ABNORMAL LOW (ref 100–199)
HDL: 53 mg/dL (ref >39)
LDL Chol Calc (NIH): 31 mg/dL (ref 0–99)
Triglycerides: 49 mg/dL (ref 0–149)
VLDL Cholesterol Cal: 12 mg/dL (ref 5–40)

## 2023-05-17 LAB — MICROALBUMIN / CREATININE URINE RATIO
Creatinine, Urine: 209 mg/dL
Microalb/Creat Ratio: 4 mg/g{creat} (ref 0–29)
Microalbumin, Urine: 9.1 ug/mL

## 2023-05-25 ENCOUNTER — Ambulatory Visit: Payer: Self-pay | Admitting: Family Medicine

## 2023-05-27 ENCOUNTER — Telehealth: Payer: Self-pay

## 2023-05-27 NOTE — Telephone Encounter (Signed)
 Copied from CRM [id number]. Topic: General - Other >> May 27, 2023 11:13 AM Paul Williams wrote: Reason for CRM: Patient would like lab results to be left on voicemail if Paul Williams does not answer the phone instead of calling the patient and asking him to call the office. Paul Williams experiences some anxiety when we ask him to call the office because Paul Williams said Paul Williams doesn't need any bad news. Please update his chart. Paul Williams stated Paul Williams already updated his information for the nurse to leave his results to his labs on his voicemail.

## 2023-05-27 NOTE — Telephone Encounter (Signed)
 Left message on pts home vmail of results. Advised pt to call back if needed.

## 2023-06-03 ENCOUNTER — Other Ambulatory Visit: Payer: Self-pay

## 2023-06-03 MED ORDER — FLEET ENEMA RE ENEM: 1.0000 | ENEMA | Freq: Once | RECTAL | 0 refills | Status: AC

## 2023-06-09 DIAGNOSIS — D631 Anemia in chronic kidney disease: Secondary | ICD-10-CM | POA: Diagnosis not present

## 2023-06-09 DIAGNOSIS — N189 Chronic kidney disease, unspecified: Secondary | ICD-10-CM | POA: Diagnosis not present

## 2023-06-09 DIAGNOSIS — E211 Secondary hyperparathyroidism, not elsewhere classified: Secondary | ICD-10-CM | POA: Diagnosis not present

## 2023-06-09 DIAGNOSIS — R809 Proteinuria, unspecified: Secondary | ICD-10-CM | POA: Diagnosis not present

## 2023-06-13 ENCOUNTER — Ambulatory Visit: Admitting: Urology

## 2023-06-16 DIAGNOSIS — E1129 Type 2 diabetes mellitus with other diabetic kidney complication: Secondary | ICD-10-CM | POA: Diagnosis not present

## 2023-06-16 DIAGNOSIS — R809 Proteinuria, unspecified: Secondary | ICD-10-CM | POA: Diagnosis not present

## 2023-06-16 DIAGNOSIS — N1831 Chronic kidney disease, stage 3a: Secondary | ICD-10-CM | POA: Diagnosis not present

## 2023-06-28 NOTE — Progress Notes (Signed)
 Pre-seed nursing interview for a diagnosis of Stage T1c adenocarcinoma of the prostate with Gleason score of 3+4, and PSA of 6.5.  Patient identity verified x2.   Patient states issues as follows...  -Pain: Denies -Fatigue: Denies -Abdomen: Denies -Groin: Denies -Urinary: Urinary urgency -Bowels: Denies -Appetite: Good  Patient denies all other related issues at this time.  Meaningful use complete.  Urinary Management medication(s)- None Urology appointment date- 08/2023, with Dr. Sherrilee at [facility]  No vitals needed for this visit.  This concludes the interaction.  Paul Minerva, LPN  NURSE REMINDER: PLAY 'PRE-SEED' EDUCATION VIDEO

## 2023-07-05 ENCOUNTER — Telehealth: Payer: Self-pay | Admitting: *Deleted

## 2023-07-05 NOTE — Telephone Encounter (Signed)
 CALLED PATIENT TO REMIND OF PRE-SEED APPTS. FOR 07/07/23, SPOKE WITH PATIENT AND HE IS AWARE OF THESE APPTS.

## 2023-07-07 ENCOUNTER — Ambulatory Visit
Admission: RE | Admit: 2023-07-07 | Discharge: 2023-07-07 | Disposition: A | Discharge status: Home / Self Care | Source: Ambulatory Visit | Attending: Radiation Oncology | Admitting: Radiation Oncology

## 2023-07-07 ENCOUNTER — Encounter: Payer: Self-pay | Admitting: Urology

## 2023-07-07 ENCOUNTER — Ambulatory Visit
Admission: RE | Admit: 2023-07-07 | Discharge: 2023-07-07 | Disposition: A | Discharge status: Home / Self Care | Payer: Self-pay | Source: Ambulatory Visit | Attending: Urology | Admitting: Urology

## 2023-07-07 VITALS — Ht 65.0 in | Wt 232.0 lb

## 2023-07-07 DIAGNOSIS — C61 Malignant neoplasm of prostate: Secondary | ICD-10-CM | POA: Diagnosis present

## 2023-07-07 DIAGNOSIS — Z191 Hormone sensitive malignancy status: Secondary | ICD-10-CM | POA: Diagnosis not present

## 2023-07-07 NOTE — Progress Notes (Signed)
 Radiation Oncology         [phone]) [phone] ________________________________  Outpatient Follow up- Pre-seed visit  Name: Paul Williams MRN: [id number]  Date: 07/07/2023  DOB: [birth year]  RR:Izuupwhzm, Fonda LABOR, MD  McKenzie, Belvie CROME, MD   REFERRING PHYSICIAN: [name] CROME, MD  DIAGNOSIS: 73 y.o. gentleman with Stage T1c adenocarcinoma of the prostate with Gleason score of 3+4, and PSA of 6.5.     ICD-10-CM   1. Malignant neoplasm of prostate (HCC)  C61       HISTORY OF PRESENT ILLNESS: Paul Williams is a 73 y.o. male with a diagnosis of prostate cancer. He was noted to have an elevated PSA of 7.4 by his primary care physician, Dr. Maryanne.  Accordingly, he was referred for evaluation in urology by Dr. Sherrilee on 02/25/23,  digital rectal examination performed at that time showed no nodules or induration. A repeat PSA obtained that day showed a slight decrease but remained elevated at 6.5. Therefore, the patient proceeded to transrectal ultrasound with 12 biopsies of the prostate on 04/13/23.  The prostate volume measured 58.2 cc.  Out of 12 core biopsies, 3 were positive.  The maximum Gleason score was 3+4, and this was seen in the right base and right base lateral. Additionally, Gleason 3+3 was seen in the right apex lateral. Of note, all positive cores were involved by only 5-10%.   The patient reviewed the biopsy results with his urologist and was kindly referred to us  for discussion of potential radiation treatment options. We initially met the patient on 05/06/23 and he was most interested in proceeding with brachytherapy and SpaceOAR gel placement for treatment of his disease. He is here today for his pre-procedure imaging for planning and to answer any additional questions he may have about this treatment.   PREVIOUS RADIATION THERAPY: No  PAST MEDICAL HISTORY:  Past Medical History:  Diagnosis Date   Adenomatous polyp of colon    Diabetes mellitus without complication (HCC)     Diverticulosis    Elevated PSA    Hyperlipidemia    Hypertension    Internal hemorrhoids    Prostate cancer (HCC) 05/2023      PAST SURGICAL HISTORY: Past Surgical History:  Procedure Laterality Date   COLONOSCOPY     POLYPECTOMY     PROSTATE BIOPSY     top plate of teeth extracted      FAMILY HISTORY:  Family History  Problem Relation Age of Onset   Stroke Mother    Diabetes Father    Prostate cancer Father        prostate   Diabetes Sister    Diabetes Sister    Colon cancer Neg Hx    Esophageal cancer Neg Hx    Rectal cancer Neg Hx    Stomach cancer Neg Hx    Colon polyps Neg Hx     SOCIAL HISTORY:  Social History   Socioeconomic History   Marital status: Married    Spouse name: Arlene   Number of children: 1   Years of education: Not on file   Highest education level: Not on file  Occupational History   Occupation: retired    Associate Professor: [employer]  Tobacco Use   Smoking status: Never   Smokeless tobacco: Never  Vaping Use   Vaping status: Never Used  Substance and Sexual Activity   Alcohol use: Never   Drug use: No   Sexual activity: Yes    Partners: Female  Other Topics  Concern   Not on file  Social History Narrative   Lives home with wife. They have one daughter in [place].   Social Drivers of Corporate investment banker Strain: Low Risk  (07/21/2022)   Overall Financial Resource Strain (CARDIA)    Difficulty of Paying Living Expenses: Not hard at all  Food Insecurity: No Food Insecurity (05/06/2023)   Hunger Vital Sign    Worried About Running Out of Food in the Last Year: Never true    Ran Out of Food in the Last Year: Never true  Transportation Needs: No Transportation Needs (05/06/2023)   PRAPARE - Administrator, Civil Service (Medical): No    Lack of Transportation (Non-Medical): No  Physical Activity: Sufficiently Active (07/21/2022)   Exercise Vital Sign    Days of Exercise per Week: 7 days    Minutes of Exercise per  Session: 30 min  Stress: No Stress Concern Present (07/21/2022)   [employer] of Occupational Health - Occupational Stress Questionnaire    Feeling of Stress : Not at all  Social Connections: Socially Integrated (07/21/2022)   Social Connection and Isolation Panel    Frequency of Communication with Friends and Family: More than three times a week    Frequency of Social Gatherings with Friends and Family: More than three times a week    Attends Religious Services: More than 4 times per year    Active Member of [employer] or Organizations: Yes    Attends Banker Meetings: More than 4 times per year    Marital Status: Married  Catering manager Violence: Not At Risk (05/06/2023)   Humiliation, Afraid, Rape, and Kick questionnaire    Fear of Current or Ex-Partner: No    Emotionally Abused: No    Physically Abused: No    Sexually Abused: No    ALLERGIES: Patient has no known allergies.  MEDICATIONS:  Current Outpatient Medications  Medication Sig Dispense Refill   Alcohol Swabs (DROPSAFE ALCOHOL PREP) 70 % PADS Test BS BID Dx E11.22 200 each 3   allopurinol  (ZYLOPRIM ) 100 MG tablet Take 0.5 tablets (50 mg total) by mouth daily. 45 tablet 3   aspirin EC 81 MG tablet Take 81 mg by mouth daily.     atorvastatin  (LIPITOR) 40 MG tablet Take 1 tablet (40 mg total) by mouth daily. 90 tablet 3   Blood Glucose Calibration (TRUE METRIX LEVEL 1) Low SOLN Use w/ glucose monitor Dx E11.22 3 each 2   Blood Glucose Monitoring Suppl (TRUE METRIX AIR GLUCOSE METER) w/Device KIT Test BS BID Dx E11.22 1 kit 0   fish oil-omega-3 fatty acids 1000 MG capsule Take 1 g by mouth daily.     glucose blood (TRUE METRIX BLOOD GLUCOSE TEST) test strip Test BS BID Dx E11.22 200 strip 3   K-PHOS 500 MG tablet Take 500 mg by mouth 2 (two) times daily.     labetalol (NORMODYNE) 200 MG tablet Take 200 mg by mouth 2 (two) times daily.     MITIGARE  0.6 MG CAPS 2 tablets at pain onset, may repeat x1in 1 hour. No  more then 3 tablets in 24 hours 30 capsule 3   Multiple Vitamin (MULTIVITAMIN) tablet Take 1 tablet by mouth daily.     OVER THE COUNTER MEDICATION Take 250 mg by mouth 2 (two) times daily. Super beta prostate     sildenafil  (VIAGRA ) 100 MG tablet Take 0.5 tablets (50 mg total) by mouth as needed for erectile dysfunction.  10 tablet 0   sitaGLIPtin  (JANUVIA ) 100 MG tablet Take 1 tablet (100 mg total) by mouth daily. 90 tablet 3   telmisartan (MICARDIS) 20 MG tablet Take 20 mg by mouth daily.     TRUEplus Lancets 33G MISC Test BS BID Dx E11.22 200 each 3   No current facility-administered medications for this encounter.    On review of systems, the patient reports that he is doing well overall. He denies any chest pain, shortness of breath, cough, fevers, chills, night sweats, unintended weight changes. He denies any bowel disturbances, and denies abdominal pain, nausea or vomiting. He denies any new musculoskeletal or joint aches or pains. His IPSS was 6, indicating mild urinary symptoms. His SHIM was 6, indicating he has severe erectile dysfunction but reports that this is not a priority for him or his wife. A complete review of systems is obtained and is otherwise negative.     PHYSICAL EXAM:  Wt Readings from Last 3 Encounters:  05/16/23 237 lb 3.2 oz (107.6 kg)  05/06/23 233 lb 3.2 oz (105.8 kg)  02/25/23 236 lb (107 kg)   Temp Readings from Last 3 Encounters:  05/16/23 97.9 F (36.6 C)  05/06/23 (!) 97 F (36.1 C) (Oral)  04/13/23 98.1 F (36.7 C) (Oral)   BP Readings from Last 3 Encounters:  05/16/23 (!) 156/90  05/06/23 (!) 141/73  04/27/23 (!) 143/70   Pulse Readings from Last 3 Encounters:  05/16/23 66  05/06/23 75  04/27/23 82    /10  In general this is a well appearing African American male in no acute distress. He's alert and oriented x4 and appropriate throughout the examination. Cardiopulmonary assessment is negative for acute distress, and he exhibits normal  effort.     KPS = 100  100 - Normal; no complaints; no evidence of disease. 90   - Able to carry on normal activity; minor signs or symptoms of disease. 80   - Normal activity with effort; some signs or symptoms of disease. 25   - Cares for self; unable to carry on normal activity or to do active work. 60   - Requires occasional assistance, but is able to care for most of his personal needs. 50   - Requires considerable assistance and frequent medical care. 40   - Disabled; requires special care and assistance. 30   - Severely disabled; hospital admission is indicated although death not imminent. 20   - Very sick; hospital admission necessary; active supportive treatment necessary. 10   - Moribund; fatal processes progressing rapidly. 0     - Dead  Karnofsky DA, Abelmann WH, Craver LS and Burchenal [facility] [address] [phone]) The use of the nitrogen mustards in the palliative treatment of carcinoma: with particular reference to bronchogenic carcinoma Cancer 1 [id number]  LABORATORY DATA:  Lab Results  Component Value Date   WBC 8.8 05/16/2023   HGB 13.1 05/16/2023   HCT 41.2 05/16/2023   MCV 89 05/16/2023   PLT 241 05/16/2023   Lab Results  Component Value Date   NA 141 05/16/2023   K 4.7 05/16/2023   CL 107 (H) 05/16/2023   CO2 24 05/16/2023   Lab Results  Component Value Date   ALT 23 05/16/2023   AST 29 05/16/2023   ALKPHOS 100 05/16/2023   BILITOT 0.5 05/16/2023     RADIOGRAPHY: No results found.    IMPRESSION/PLAN: 1. 73 y.o. gentleman with Stage T1c adenocarcinoma of the prostate with Gleason score of 3+4, and PSA  of 6.5.  The patient has elected to proceed with seed implant for treatment of his disease. We reviewed the risks, benefits, short and long-term effects associated with brachytherapy and discussed the role of SpaceOAR in reducing the rectal toxicity associated with radiotherapy.  He appears to have a good understanding of his disease and our treatment recommendations which  are of curative intent.  He was encouraged to ask questions that were answered to his stated satisfaction. He has freely signed written consent to proceed today in the office and a copy of this document will be placed in his medical record. His procedure is tentatively scheduled for 07/28/23 in collaboration with Dr. Sherrilee and we will see him back for his post-procedure visit approximately 3 weeks thereafter. We look forward to continuing to participate in his care. He knows that he is welcome to call with any questions or concerns at any time in the interim.  I personally spent 30 minutes in this encounter including chart review, reviewing radiological studies, meeting face-to-face with the patient, entering orders and completing documentation.    [name], MMS, PA-C [facility] Radiation Oncology Physician Assistant Direct Dial: [phone]  Fax: [phone]

## 2023-07-07 NOTE — Progress Notes (Signed)
  Radiation Oncology         [phone]) [phone] ________________________________  Name: Paul Williams MRN: [id number]  Date: 07/07/2023  DOB: [birth year]  SIMULATION AND TREATMENT PLANNING NOTE PUBIC ARCH STUDY  RR:Izuupwhzm, Fonda LABOR, MD  [name] CROME, MD  DIAGNOSIS: 73 y.o. gentleman with Stage T1c adenocarcinoma of the prostate with Gleason score of 3+4, and PSA of 6.5.   Oncology History  Malignant neoplasm of prostate (HCC)  04/13/2023 Cancer Staging   Staging form: Prostate, AJCC 8th Edition - Clinical stage from 04/13/2023: Stage IIB (cT1c, cN0, cM0, PSA: 6.5, Grade Group: 2) - Signed by Sherwood Rise, PA-C on 05/06/2023 Histopathologic type: Adenocarcinoma, NOS Stage prefix: Initial diagnosis Prostate specific antigen (PSA) range: Less than 10 Gleason primary pattern: 3 Gleason secondary pattern: 4 Gleason score: 7 Histologic grading system: 5 grade system Number of biopsy cores examined: 12 Number of biopsy cores positive: 3 Location of positive needle core biopsies: One side   05/06/2023 Initial Diagnosis   Malignant neoplasm of prostate (HCC)       ICD-10-CM   1. Malignant neoplasm of prostate (HCC)  C61       COMPLEX SIMULATION:  The patient presented today for evaluation for possible prostate seed implant. He was brought to the radiation planning suite and placed supine on the CT couch. A 3-dimensional image study set was obtained in upload to the planning computer. There, on each axial slice, I contoured the prostate gland. Then, using three-dimensional radiation planning tools I reconstructed the prostate in view of the structures from the transperineal needle pathway to assess for possible pubic arch interference. In doing so, I did not appreciate any pubic arch interference. Also, the patient's prostate volume was estimated based on the drawn structure. The volume was 57 cc.  Given the pubic arch appearance and prostate volume, patient remains a good candidate to proceed  with prostate seed implant. Today, he freely provided informed written consent to proceed.    PLAN: The patient will undergo prostate seed implant.   ________________________________  Donnice LABOR. Patrcia, M.D.

## 2023-07-14 NOTE — Patient Instructions (Addendum)
 SURGICAL WAITING ROOM VISITATION  Patients having surgery or a procedure may have no more than 2 support people in the waiting area - these visitors may rotate.    Children under the age of 77 must have an adult with them who is not the patient.  Visitors with respiratory illnesses are discouraged from visiting and should remain at home.  If the patient needs to stay at the hospital during part of their recovery, the visitor guidelines for inpatient rooms apply. Pre-op nurse will coordinate an appropriate time for 1 support person to accompany patient in pre-op.  This support person may not rotate.    Please refer to the [facility] website for the visitor guidelines for Inpatients (after your surgery is over and you are in a regular room).    Your procedure is scheduled on: 07/28/23   Report to [facility] Main Entrance    Report to admitting at 10:45 AM   Call this number if you have problems the morning of surgery [phone]   Do not eat food or drink liquids :After Midnight.          If you have questions, please contact your surgeon's office.   FOLLOW BOWEL PREP AND ANY ADDITIONAL PRE OP INSTRUCTIONS YOU RECEIVED FROM YOUR SURGEON'S OFFICE!!!     Oral Hygiene is also important to reduce your risk of infection.                                    Remember - BRUSH YOUR TEETH THE MORNING OF SURGERY WITH YOUR REGULAR TOOTHPASTE  DENTURES WILL BE REMOVED PRIOR TO SURGERY PLEASE DO NOT APPLY Poly grip OR ADHESIVES!!!   Stop all vitamins and herbal supplements 7 days before surgery.   Take these medicines the morning of surgery with A SIP OF WATER: Allopurinol , Labetalol   DO NOT TAKE ANY ORAL DIABETIC MEDICATIONS DAY OF YOUR SURGERY  How to Manage Your Diabetes Before and After Surgery  Why is it important to control my blood sugar before and after surgery? Improving blood sugar levels before and after surgery helps healing and can limit problems. A way of  improving blood sugar control is eating a healthy diet by:  Eating less sugar and carbohydrates  Increasing activity/exercise  Talking with your doctor about reaching your blood sugar goals High blood sugars (greater than 180 mg/dL) can raise your risk of infections and slow your recovery, so you will need to focus on controlling your diabetes during the weeks before surgery. Make sure that the doctor who takes care of your diabetes knows about your planned surgery including the date and location.  How do I manage my blood sugar before surgery? Check your blood sugar at least 4 times a day, starting 2 days before surgery, to make sure that the level is not too high or low. Check your blood sugar the morning of your surgery when you wake up and every 2 hours until you get to the Short Stay unit. If your blood sugar is less than 70 mg/dL, you will need to treat for low blood sugar: Do not take insulin. Treat a low blood sugar (less than 70 mg/dL) with  cup of clear juice (cranberry or apple), 4 glucose tablets, OR glucose gel. Recheck blood sugar in 15 minutes after treatment (to make sure it is greater than 70 mg/dL). If your blood sugar is not greater than 70  mg/dL on recheck, call [phone] for further instructions. Report your blood sugar to the short stay nurse when you get to Short Stay.  If you are admitted to the hospital after surgery: Your blood sugar will be checked by the staff and you will probably be given insulin after surgery (instead of oral diabetes medicines) to make sure you have good blood sugar levels. The goal for blood sugar control after surgery is 80-180 mg/dL.   WHAT DO I DO ABOUT MY DIABETES MEDICATION?  Do not take oral diabetes medicines (pills) the morning of surgery.  THE DAY BEFORE SURGERY,  Take Januvia  as prescribed.   THE MORNING OF SURGERY, do not take Januvia   Reviewed and Endorsed by [facility] Patient Education Committee, August 2015              You may not have any metal on your body including jewelry, and body piercing             Do not wear lotions, powders, cologne, or deodorant              Men may shave face and neck.   Do not bring valuables to the hospital. [facility] IS NOT             RESPONSIBLE   FOR VALUABLES.   Contacts, glasses, dentures or bridgework may not be worn into surgery.  DO NOT BRING YOUR HOME MEDICATIONS TO THE HOSPITAL. PHARMACY WILL DISPENSE MEDICATIONS LISTED ON YOUR MEDICATION LIST TO YOU DURING YOUR ADMISSION IN THE HOSPITAL!    Patients discharged on the day of surgery will not be allowed to drive home.  Someone NEEDS to stay with you for the first 24 hours after anesthesia.              Please read over the following fact sheets you were given: IF YOU HAVE QUESTIONS ABOUT YOUR PRE-OP INSTRUCTIONS PLEASE CALL [phone]   If you received a COVID test during your pre-op visit  it is requested that you wear a mask when out in public, stay away from anyone that may not be feeling well and notify your surgeon if you develop symptoms. If you test positive for Covid or have been in contact with anyone that has tested positive in the last 10 days please notify you surgeon.    [facility] - Preparing for Surgery Before surgery, you can play an important role.  Because skin is not sterile, your skin needs to be as free of germs as possible.  You can reduce the number of germs on your skin by washing with CHG (chlorahexidine gluconate) soap before surgery.  CHG is an antiseptic cleaner which kills germs and bonds with the skin to continue killing germs even after washing. Please DO NOT use if you have an allergy to CHG or antibacterial soaps.  If your skin becomes reddened/irritated stop using the CHG and inform your nurse when you arrive at Short Stay. Do not shave (including legs and underarms) for at least 48 hours prior to the first CHG shower.  You may shave your face/neck.  Please follow  these instructions carefully:  1.  Shower with CHG Soap the night before surgery and the  morning of surgery.  2.  If you choose to wash your hair, wash your hair first as usual with your normal  shampoo.  3.  After you shampoo, rinse your hair and body thoroughly to remove the shampoo.  4.  Use CHG as you would any other liquid soap.  You can apply chg directly to the skin and wash.  Gently with a scrungie or clean washcloth.  5.  Apply the CHG Soap to your body ONLY FROM THE NECK DOWN.   Do   not use on face/ open                           Wound or open sores. Avoid contact with eyes, ears mouth and   genitals (private parts).                       Wash face,  Genitals (private parts) with your normal soap.             6.  Wash thoroughly, paying special attention to the area where your    surgery  will be performed.  7.  Thoroughly rinse your body with warm water from the neck down.  8.  DO NOT shower/wash with your normal soap after using and rinsing off the CHG Soap.                9.  Pat yourself dry with a clean towel.            10.  Wear clean pajamas.            11.  Place clean sheets on your bed the night of your first shower and do not  sleep with pets. Day of Surgery : Do not apply any lotions/deodorants the morning of surgery.  Please wear clean clothes to the hospital/surgery center.  FAILURE TO FOLLOW THESE INSTRUCTIONS MAY RESULT IN THE CANCELLATION OF YOUR SURGERY  PATIENT SIGNATURE_________________________________  NURSE SIGNATURE__________________________________  ________________________________________________________________________

## 2023-07-14 NOTE — Progress Notes (Addendum)
 COVID Vaccine Completed: yes  Date of COVID positive in last 90 days:  PCP - Fonda Levins, MD Cardiologist - n/a  Chest x-ray - n/a EKG - 07/18/23 Epic/chart Stress Test - n/a ECHO - n/a Cardiac Cath - n/a Pacemaker/ICD device last checked: n/a Spinal Cord Stimulator: n/a  Bowel Prep - no  Sleep Study - yes CPAP - no  Fasting Blood Sugar - 100 Checks Blood Sugar 1-2 times a day  Last dose of GLP1 agonist-  N/A GLP1 instructions:  Do not take after     Last dose of SGLT-2 inhibitors-  N/A SGLT-2 instructions:  Do not take after     Blood Thinner Instructions:  Last dose:   Time: Aspirin Instructions: ASA 81, hold for 5-7 days Last Dose:  Activity level: Can go up a flight of stairs and perform activities of daily living without stopping and without symptoms of chest pain or shortness of breath.  Anesthesia review: PACs on EKG  Patient denies shortness of breath, fever, cough and chest pain at PAT appointment  Patient verbalized understanding of instructions that were given to them at the PAT appointment. Patient was also instructed that they will need to review over the PAT instructions again at home before surgery.

## 2023-07-18 ENCOUNTER — Other Ambulatory Visit: Payer: Self-pay

## 2023-07-18 ENCOUNTER — Encounter (HOSPITAL_COMMUNITY)
Admission: RE | Admit: 2023-07-18 | Discharge: 2023-07-18 | Disposition: A | Discharge status: Home / Self Care | Source: Ambulatory Visit | Attending: Urology | Admitting: Urology

## 2023-07-18 ENCOUNTER — Encounter (HOSPITAL_COMMUNITY): Payer: Self-pay

## 2023-07-18 VITALS — BP 139/89 | HR 71 | Temp 98.3°F | Resp 14 | Ht 65.0 in | Wt 237.0 lb

## 2023-07-18 DIAGNOSIS — E1169 Type 2 diabetes mellitus with other specified complication: Secondary | ICD-10-CM | POA: Insufficient documentation

## 2023-07-18 DIAGNOSIS — Z01818 Encounter for other preprocedural examination: Secondary | ICD-10-CM | POA: Diagnosis not present

## 2023-07-18 DIAGNOSIS — E785 Hyperlipidemia, unspecified: Secondary | ICD-10-CM | POA: Diagnosis not present

## 2023-07-18 HISTORY — DX: Chronic kidney disease, unspecified: N18.9

## 2023-07-18 HISTORY — DX: Sleep apnea, unspecified: G47.30

## 2023-07-18 LAB — CBC
HCT: 41.2 % (ref 39.0–52.0)
Hemoglobin: 13.1 g/dL (ref 13.0–17.0)
MCH: 28.4 pg (ref 26.0–34.0)
MCHC: 31.8 g/dL (ref 30.0–36.0)
MCV: 89.2 fL (ref 80.0–100.0)
Platelets: 241 K/uL (ref 150–400)
RBC: 4.62 MIL/uL (ref 4.22–5.81)
RDW: 14.5 % (ref 11.5–15.5)
WBC: 7.5 K/uL (ref 4.0–10.5)
nRBC: 0 % (ref 0.0–0.2)

## 2023-07-18 LAB — GLUCOSE, CAPILLARY: Glucose-Capillary: 134 mg/dL — ABNORMAL HIGH (ref 70–99)

## 2023-07-18 LAB — BASIC METABOLIC PANEL WITH GFR
Anion gap: 9 (ref 5–15)
BUN: 8 mg/dL (ref 8–23)
CO2: 25 mmol/L (ref 22–32)
Calcium: 9.7 mg/dL (ref 8.9–10.3)
Chloride: 107 mmol/L (ref 98–111)
Creatinine, Ser: 1.39 mg/dL — ABNORMAL HIGH (ref 0.61–1.24)
GFR, Estimated: 54 mL/min — ABNORMAL LOW (ref >60)
Glucose, Bld: 135 mg/dL — ABNORMAL HIGH (ref 70–99)
Potassium: 4.2 mmol/L (ref 3.5–5.1)
Sodium: 141 mmol/L (ref 135–145)

## 2023-07-19 LAB — HEMOGLOBIN A1C
Hgb A1c MFr Bld: 7 % — ABNORMAL HIGH (ref 4.8–5.6)
Mean Plasma Glucose: 154 mg/dL

## 2023-07-26 ENCOUNTER — Encounter (HOSPITAL_COMMUNITY): Payer: Self-pay

## 2023-07-26 NOTE — Anesthesia Preprocedure Evaluation (Signed)
 Anesthesia Evaluation  Patient identified by MRN, date of birth, ID band Patient awake    Reviewed: Allergy & Precautions, NPO status , Patient's Chart, lab work & pertinent test results  History of Anesthesia Complications Negative for: history of anesthetic complications  Airway Mallampati: III  TM Distance: >3 FB Neck ROM: Full    Dental  (+) Edentulous Upper, Partial Lower   Pulmonary sleep apnea , neg COPD, Patient abstained from smoking.Not current smoker   Pulmonary exam normal breath sounds clear to auscultation       Cardiovascular Exercise Tolerance: Good METShypertension, Pt. on medications (-) CAD and (-) Past MI (-) dysrhythmias  Rhythm:Regular Rate:Normal - Systolic murmurs    Neuro/Psych negative neurological ROS  negative psych ROS   GI/Hepatic ,neg GERD  ,,(+)     (-) substance abuse    Endo/Other  diabetes, Well Controlled    Renal/GU CRFRenal disease     Musculoskeletal   Abdominal  (+) + obese  Peds  Hematology   Anesthesia Other Findings Past Medical History: No date: Adenomatous polyp of colon No date: CKD (chronic kidney disease) No date: Diabetes mellitus without complication (HCC) No date: Diverticulosis No date: Elevated PSA No date: Hyperlipidemia No date: Hypertension No date: Internal hemorrhoids 05/2023: Prostate cancer (HCC) No date: Sleep apnea  Reproductive/Obstetrics                              Anesthesia Physical Anesthesia Plan  ASA: 2  Anesthesia Plan: General   Post-op Pain Management:    Induction: Intravenous  PONV Risk Score and Plan: 3 and Ondansetron , Dexamethasone  and Midazolam  Airway Management Planned: Oral ETT  Additional Equipment: None  Intra-op Plan:   Post-operative Plan: Extubation in OR  Informed Consent: I have reviewed the patients History and Physical, chart, labs and discussed the procedure including the  risks, benefits and alternatives for the proposed anesthesia with the patient or authorized representative who has indicated his/her understanding and acceptance.     Dental advisory given  Plan Discussed with: CRNA and Surgeon  Anesthesia Plan Comments: (Discussed risks of anesthesia with patient, including PONV, sore throat, lip/dental/eye damage. Rare risks discussed as well, such as cardiorespiratory and neurological sequelae, and allergic reactions. Discussed the role of CRNA in patient's perioperative care. Patient understands.)         Anesthesia Quick Evaluation

## 2023-07-27 ENCOUNTER — Telehealth: Payer: Self-pay

## 2023-07-27 ENCOUNTER — Telehealth: Payer: Self-pay | Admitting: *Deleted

## 2023-07-27 ENCOUNTER — Ambulatory Visit

## 2023-07-27 NOTE — Progress Notes (Signed)
  Radiation Oncology         [phone] ________________________________  Name: Paul Williams MRN: [id number]  Date: 07/28/2023  DOB: 1950/10/11       Prostate Seed Implant  RR:Izuupwhzm, Fonda LABOR, MD  No ref. provider found  DIAGNOSIS:  73 y.o. gentleman with Stage T1c adenocarcinoma of the prostate with Gleason score of 3+4, and PSA of 6.5.   Oncology History  Malignant neoplasm of prostate (HCC)  04/13/2023 Cancer Staging   Staging form: Prostate, AJCC 8th Edition - Clinical stage from 04/13/2023: Stage IIB (cT1c, cN0, cM0, PSA: 6.5, Grade Group: 2) - Signed by Sherwood Rise, PA-C on 05/06/2023 Histopathologic type: Adenocarcinoma, NOS Stage prefix: Initial diagnosis Prostate specific antigen (PSA) range: Less than 10 Gleason primary pattern: 3 Gleason secondary pattern: 4 Gleason score: 7 Histologic grading system: 5 grade system Number of biopsy cores examined: 12 Number of biopsy cores positive: 3 Location of positive needle core biopsies: One side   05/06/2023 Initial Diagnosis   Malignant neoplasm of prostate (HCC)       ICD-10-CM   1. Hyperlipidemia associated with type 2 diabetes mellitus (HCC)  E11.69 CBG per Guidelines for Diabetes Management for Patients Undergoing Surgery ([facility], [facility], and [facility] only)   E78.5 CBG per Guidelines for Diabetes Management for Patients Undergoing Surgery ([facility], [facility], and [facility] only)      PROCEDURE: Insertion of radioactive I-125 seeds into the prostate gland.  RADIATION DOSE: 145 Gy, definitive therapy.  TECHNIQUE: Paul Williams was brought to the operating room with the urologist. He was placed in the dorsolithotomy position. He was catheterized and a rectal tube was inserted. The perineum was shaved, prepped and draped. The ultrasound probe was then introduced by me into the rectum to see the prostate gland.  TREATMENT DEVICE: I attached the needle grid to the ultrasound probe stand and anchor needles were placed.  3D PLANNING: The prostate was  imaged in 3D using a sagittal sweep of the prostate probe. These images were transferred to the planning computer. There, the prostate, urethra and rectum were defined on each axial reconstructed image. Then, the software created an optimized 3D plan and a few seed positions were adjusted. The quality of the plan was reviewed using [facility] information for the target and the following two organs at risk:  Urethra and Rectum.  Then the accepted plan was printed and handed off to the radiation therapist.  Under my supervision, the custom loading of the seeds and spacers was carried out using the quick loader.  These pre-loaded needles were then placed into the needle holder.SABRA  PROSTATE VOLUME STUDY:  Using transrectal ultrasound the volume of the prostate was verified to be 55.3 cc.  SPECIAL TREATMENT PROCEDURE/SUPERVISION AND HANDLING: The pre-loaded needles were then delivered by the urologist under sagittal guidance. A total of 18 needles were used to deposit 66 seeds in the prostate gland. The individual seed activity was 0.545 mCi.  SpaceOAR:  Yes  COMPLEX SIMULATION: At the end of the procedure, an anterior radiograph of the pelvis was obtained to document seed positioning and count. Cystoscopy was performed by the urologist to check the urethra and bladder.  MICRODOSIMETRY: At the end of the procedure, the patient was emitting 0.071 mR/hr at 1 meter. Accordingly, he was considered safe for hospital discharge.  PLAN: The patient will return to the radiation oncology clinic for post implant CT dosimetry in three weeks.   ________________________________  Paul Williams, M.D.

## 2023-07-27 NOTE — Telephone Encounter (Signed)
 Returned patient's phone call, spoke with patient

## 2023-07-27 NOTE — Telephone Encounter (Signed)
 CALLED PATIENT TO REMIND OF PROCEDURE FOR 07-28-23, LVM FOR A RETURN CALL

## 2023-07-27 NOTE — Telephone Encounter (Signed)
 Copied from CRM #[id number]. Topic: General - Other >> Jul 27, 2023  1:46 PM Carlatta H wrote: Reason for CRM: Elder please call the patient back about missed appointment//

## 2023-07-28 ENCOUNTER — Ambulatory Visit (HOSPITAL_COMMUNITY)
Admission: RE | Admit: 2023-07-28 | Discharge: 2023-07-28 | Disposition: A | Discharge status: Home / Self Care | Source: Ambulatory Visit | Attending: Urology | Admitting: Urology

## 2023-07-28 ENCOUNTER — Ambulatory Visit (HOSPITAL_COMMUNITY)

## 2023-07-28 ENCOUNTER — Ambulatory Visit (HOSPITAL_COMMUNITY): Payer: Self-pay | Admitting: Medical

## 2023-07-28 ENCOUNTER — Encounter (HOSPITAL_COMMUNITY)
Admission: RE | Disposition: A | Discharge status: Home / Self Care | Payer: Self-pay | Source: Ambulatory Visit | Attending: Urology

## 2023-07-28 ENCOUNTER — Encounter (HOSPITAL_COMMUNITY): Payer: Self-pay | Admitting: Urology

## 2023-07-28 ENCOUNTER — Ambulatory Visit (HOSPITAL_COMMUNITY): Admitting: Anesthesiology

## 2023-07-28 DIAGNOSIS — Z7984 Long term (current) use of oral hypoglycemic drugs: Secondary | ICD-10-CM | POA: Insufficient documentation

## 2023-07-28 DIAGNOSIS — C61 Malignant neoplasm of prostate: Secondary | ICD-10-CM | POA: Diagnosis not present

## 2023-07-28 DIAGNOSIS — I129 Hypertensive chronic kidney disease with stage 1 through stage 4 chronic kidney disease, or unspecified chronic kidney disease: Secondary | ICD-10-CM | POA: Insufficient documentation

## 2023-07-28 DIAGNOSIS — E1169 Type 2 diabetes mellitus with other specified complication: Secondary | ICD-10-CM

## 2023-07-28 DIAGNOSIS — Z191 Hormone sensitive malignancy status: Secondary | ICD-10-CM | POA: Diagnosis not present

## 2023-07-28 DIAGNOSIS — N183 Chronic kidney disease, stage 3 unspecified: Secondary | ICD-10-CM | POA: Insufficient documentation

## 2023-07-28 DIAGNOSIS — E1122 Type 2 diabetes mellitus with diabetic chronic kidney disease: Secondary | ICD-10-CM | POA: Insufficient documentation

## 2023-07-28 HISTORY — PX: RADIOACTIVE SEED IMPLANT: SHX5150

## 2023-07-28 LAB — GLUCOSE, CAPILLARY
Glucose-Capillary: 150 mg/dL — ABNORMAL HIGH (ref 70–99)
Glucose-Capillary: 110 mg/dL — ABNORMAL HIGH (ref 70–99)
Glucose-Capillary: 141 mg/dL — ABNORMAL HIGH (ref 70–99)

## 2023-07-28 SURGERY — INSERTION, RADIATION SOURCE, PROSTATE
Anesthesia: General

## 2023-07-28 MED ORDER — PHENYLEPHRINE 80 MCG/ML (10ML) SYRINGE FOR IV PUSH (FOR BLOOD PRESSURE SUPPORT)
PREFILLED_SYRINGE | INTRAVENOUS | Status: DC | PRN
  Administered 2023-07-28: 80 ug via INTRAVENOUS

## 2023-07-28 MED ORDER — ROCURONIUM BROMIDE 10 MG/ML (PF) SYRINGE
PREFILLED_SYRINGE | INTRAVENOUS | Status: DC | PRN
  Administered 2023-07-28: 60 mg via INTRAVENOUS
  Administered 2023-07-28: 20 mg via INTRAVENOUS

## 2023-07-28 MED ORDER — STERILE WATER FOR IRRIGATION IR SOLN
Status: DC | PRN
Start: 2023-07-28 — End: 2023-07-28
  Administered 2023-07-28: 500 mL

## 2023-07-28 MED ORDER — LIDOCAINE HCL (PF) 2 % IJ SOLN
INTRAMUSCULAR | Status: DC | PRN
  Administered 2023-07-28: 100 mg via INTRADERMAL

## 2023-07-28 MED ORDER — SUGAMMADEX SODIUM 200 MG/2ML IV SOLN
INTRAVENOUS | Status: DC | PRN
  Administered 2023-07-28: 220 mg via INTRAVENOUS

## 2023-07-28 MED ORDER — SODIUM CHLORIDE (PF) 0.9 % IJ SOLN
INTRAMUSCULAR | Status: AC
Start: 2023-07-28 — End: 2023-07-28
  Filled 2023-07-28: qty 10

## 2023-07-28 MED ORDER — ONDANSETRON HCL 4 MG/2ML IJ SOLN
4.0000 mg | Freq: Once | INTRAMUSCULAR | Status: AC | PRN
  Administered 2023-07-28: 4 mg via INTRAVENOUS

## 2023-07-28 MED ORDER — CEFAZOLIN SODIUM-DEXTROSE 2-4 GM/100ML-% IV SOLN
2.0000 g | INTRAVENOUS | Status: AC
  Administered 2023-07-28: 2 g via INTRAVENOUS
  Filled 2023-07-28: qty 100

## 2023-07-28 MED ORDER — SUGAMMADEX SODIUM 200 MG/2ML IV SOLN
INTRAVENOUS | Status: AC
  Filled 2023-07-28: qty 4

## 2023-07-28 MED ORDER — INSULIN ASPART 100 UNIT/ML IJ SOLN: 0.0000 [IU] | INTRAMUSCULAR | Status: DC | PRN

## 2023-07-28 MED ORDER — FENTANYL CITRATE PF 50 MCG/ML IJ SOSY
PREFILLED_SYRINGE | INTRAMUSCULAR | Status: AC
  Filled 2023-07-28: qty 2

## 2023-07-28 MED ORDER — FENTANYL CITRATE (PF) 100 MCG/2ML IJ SOLN
INTRAMUSCULAR | Status: DC | PRN
  Administered 2023-07-28: 100 ug via INTRAVENOUS
  Administered 2023-07-28 (×2): 50 ug via INTRAVENOUS

## 2023-07-28 MED ORDER — PHENYLEPHRINE 80 MCG/ML (10ML) SYRINGE FOR IV PUSH (FOR BLOOD PRESSURE SUPPORT)
PREFILLED_SYRINGE | INTRAVENOUS | Status: AC
  Filled 2023-07-28: qty 10

## 2023-07-28 MED ORDER — SODIUM CHLORIDE 0.9 % IR SOLN
Status: DC | PRN
  Administered 2023-07-28: 1000 mL via INTRAVESICAL

## 2023-07-28 MED ORDER — ACETAMINOPHEN 10 MG/ML IV SOLN
1000.0000 mg | Freq: Once | INTRAVENOUS | Status: DC | PRN
Start: 2023-07-28 — End: 2023-07-28

## 2023-07-28 MED ORDER — PROPOFOL 10 MG/ML IV BOLUS
INTRAVENOUS | Status: AC
  Filled 2023-07-28: qty 20

## 2023-07-28 MED ORDER — FENTANYL CITRATE (PF) 100 MCG/2ML IJ SOLN
INTRAMUSCULAR | Status: AC
  Filled 2023-07-28: qty 2

## 2023-07-28 MED ORDER — IOHEXOL 300 MG/ML  SOLN
INTRAMUSCULAR | Status: DC | PRN
Start: 2023-07-28 — End: 2023-07-28
  Administered 2023-07-28: 5 mL

## 2023-07-28 MED ORDER — ONDANSETRON HCL 4 MG/2ML IJ SOLN
INTRAMUSCULAR | Status: AC
  Filled 2023-07-28: qty 2

## 2023-07-28 MED ORDER — DEXAMETHASONE SODIUM PHOSPHATE 10 MG/ML IJ SOLN
INTRAMUSCULAR | Status: DC | PRN
  Administered 2023-07-28: 10 mg via INTRAVENOUS

## 2023-07-28 MED ORDER — TRAMADOL HCL 50 MG PO TABS: 50.0000 mg | ORAL_TABLET | Freq: Four times a day (QID) | ORAL | 0 refills | Status: DC | PRN

## 2023-07-28 MED ORDER — SODIUM CHLORIDE (PF) 0.9 % IJ SOLN
INTRAMUSCULAR | Status: DC | PRN
Start: 2023-07-28 — End: 2023-07-28
  Administered 2023-07-28: 10 mL

## 2023-07-28 MED ORDER — PROPOFOL 10 MG/ML IV BOLUS
INTRAVENOUS | Status: DC | PRN
  Administered 2023-07-28: 150 mg via INTRAVENOUS

## 2023-07-28 MED ORDER — ORAL CARE MOUTH RINSE: 15.0000 mL | Freq: Once | OROMUCOSAL | Status: AC

## 2023-07-28 MED ORDER — OXYCODONE HCL 5 MG PO TABS
ORAL_TABLET | ORAL | Status: AC
Start: 2023-07-28 — End: 2023-07-28
  Filled 2023-07-28: qty 1

## 2023-07-28 MED ORDER — FENTANYL CITRATE PF 50 MCG/ML IJ SOSY
25.0000 ug | PREFILLED_SYRINGE | INTRAMUSCULAR | Status: DC | PRN
  Administered 2023-07-28 (×2): 50 ug via INTRAVENOUS

## 2023-07-28 MED ORDER — ONDANSETRON HCL 4 MG/2ML IJ SOLN
INTRAMUSCULAR | Status: DC | PRN
  Administered 2023-07-28: 4 mg via INTRAVENOUS

## 2023-07-28 MED ORDER — LACTATED RINGERS IV SOLN: INTRAVENOUS | Status: DC

## 2023-07-28 MED ORDER — OXYCODONE HCL 5 MG PO TABS
5.0000 mg | ORAL_TABLET | Freq: Once | ORAL | Status: AC | PRN
  Administered 2023-07-28: 5 mg via ORAL

## 2023-07-28 MED ORDER — CHLORHEXIDINE GLUCONATE 0.12 % MT SOLN
15.0000 mL | Freq: Once | OROMUCOSAL | Status: AC
  Administered 2023-07-28: 15 mL via OROMUCOSAL

## 2023-07-28 MED ORDER — OXYCODONE HCL 5 MG/5ML PO SOLN: 5.0000 mg | Freq: Once | ORAL | Status: AC | PRN

## 2023-07-28 SURGICAL SUPPLY — 39 items
BAG URINE DRAIN 2000ML AR STRL (UROLOGICAL SUPPLIES) ×1 IMPLANT
BARD QUICKLINK CARTRIDGES WITH BRACHYSOURCE I-125 IMPLANT
BLADE CLIPPER SENSICLIP SURGIC (BLADE) ×1 IMPLANT
CATH FOLEY 2WAY SLVR 5CC 16FR (CATHETERS) ×2 IMPLANT
CATH ROBINSON RED A/P 20FR (CATHETERS) ×1 IMPLANT
CLOTH BEACON ORANGE TIMEOUT ST (SAFETY) ×1 IMPLANT
COVER BACK TABLE 60X90IN (DRAPES) ×1 IMPLANT
COVER MAYO STAND STRL (DRAPES) ×1 IMPLANT
DRSG TEGADERM 4X4.75 (GAUZE/BANDAGES/DRESSINGS) ×1 IMPLANT
DRSG TEGADERM 8X12 (GAUZE/BANDAGES/DRESSINGS) ×1 IMPLANT
GLOVE BIO SURGEON STRL SZ 6.5 (GLOVE) IMPLANT
GLOVE BIO SURGEON STRL SZ7.5 (GLOVE) IMPLANT
GLOVE BIO SURGEON STRL SZ8 (GLOVE) ×1 IMPLANT
GLOVE BIOGEL PI IND STRL 6.5 (GLOVE) IMPLANT
GLOVE SURG ORTHO 8.5 STRL (GLOVE) IMPLANT
GLOVE SURG SS PI 7.5 STRL IVOR (GLOVE) IMPLANT
GOWN STRL REUS W/TWL XL LVL3 (GOWN DISPOSABLE) ×1 IMPLANT
GRID BRACH TEMP 18GA 2.8X3X.75 (MISCELLANEOUS) ×1 IMPLANT
HOLDER FOLEY CATH W/STRAP (MISCELLANEOUS) ×1 IMPLANT
IMPL SPACEOAR SYSTEM 10ML (Spacer) IMPLANT
IV NS 1000ML BAXH (IV SOLUTION) ×1 IMPLANT
KIT TURNOVER CYSTO (KITS) ×1 IMPLANT
MANIFOLD NEPTUNE II (INSTRUMENTS) IMPLANT
NDL BRACHY 18G 5PK (NEEDLE) ×4 IMPLANT
NDL BRACHY 18G SINGLE (NEEDLE) IMPLANT
NDL PK MORGANSTERN STABILIZ (NEEDLE) ×1 IMPLANT
NEEDLE BRACHY 18G 5PK (NEEDLE) ×4 IMPLANT
NEEDLE BRACHY 18G SINGLE (NEEDLE) IMPLANT
NEEDLE PK MORGANSTERN STABILIZ (NEEDLE) ×1 IMPLANT
PACK CYSTO (CUSTOM PROCEDURE TRAY) ×1 IMPLANT
SHEATH ULTRASOUND LF (SHEATH) IMPLANT
SHEATH ULTRASOUND LTX NONSTRL (SHEATH) IMPLANT
SLEEVE SCD COMPRESS KNEE MED (STOCKING) ×1 IMPLANT
SYR 10ML LL (SYRINGE) ×1 IMPLANT
TOWEL OR 17X24 6PK STRL BLUE (TOWEL DISPOSABLE) ×1 IMPLANT
TRAY FOLEY MTR SLVR 16FR STAT (SET/KITS/TRAYS/PACK) IMPLANT
UNDERPAD 30X36 HEAVY ABSORB (UNDERPADS AND DIAPERS) ×2 IMPLANT
WATER STERILE IRR 3000ML UROMA (IV SOLUTION) ×1 IMPLANT
WATER STERILE IRR 500ML POUR (IV SOLUTION) ×1 IMPLANT

## 2023-07-28 NOTE — Discharge Instructions (Signed)
 Discharge with foley catheter

## 2023-07-28 NOTE — Anesthesia Procedure Notes (Addendum)
 Procedure Name: Intubation Date/Time: 07/28/2023 1:41 PM  Performed by: Therisa Doyal CROME, CRNAPre-anesthesia Checklist: Patient identified, Emergency Drugs available, Suction available and Patient being monitored Patient Re-evaluated:Patient Re-evaluated prior to induction Oxygen Delivery Method: Circle system utilized Preoxygenation: Pre-oxygenation with 100% oxygen Induction Type: IV induction Ventilation: Mask ventilation without difficulty and Oral airway inserted - appropriate to patient size Laryngoscope Size: Miller and 3 Grade View: Grade II Tube type: Oral Tube size: 7.5 mm Number of attempts: 2 Airway Equipment and Method: Stylet and Oral airway Placement Confirmation: ETT inserted through vocal cords under direct vision, positive ETCO2 and breath sounds checked- equal and bilateral Secured at: 23 cm Tube secured with: Tape Dental Injury: Teeth and Oropharynx as per pre-operative assessment  Comments: First attempt resulted in esophageal intubation. ETT removed, and placed again properly. Hemodynamically stable, no SpO2 desaturation

## 2023-07-28 NOTE — Transfer of Care (Signed)
 Immediate Anesthesia Transfer of Care Note  Patient: Paul Williams  Procedure(s) Performed: INSERTION, RADIATION SOURCE, PROSTATE, SPACEOAR  Patient Location: PACU  Anesthesia Type:General  Level of Consciousness: awake  Airway & Oxygen Therapy: Patient Spontanous Breathing and Patient connected to face mask oxygen  Post-op Assessment: Report given to RN and Post -op Vital signs reviewed and stable  Post vital signs: Reviewed and stable  Last Vitals:  Vitals Value Taken Time  BP 135/69 07/28/23 15:06  Temp    Pulse 77 07/28/23 15:08  Resp 13 07/28/23 15:08  SpO2 100 % 07/28/23 15:08  Vitals shown include unfiled device data.  Last Pain:  Vitals:   07/28/23 1105  TempSrc:   PainSc: 0-No pain         Complications: No notable events documented.

## 2023-07-28 NOTE — H&P (Signed)
 HPI: Paul Williams is a 73yo here for brachytherapy with SpaceOAR for prostate cancer. Biopsy revealed 3+4=7 in 2/12 cores and Gleason 3+3=6 in 1/12 cores. PSA 7.4.    PMH:     Past Medical History:  Diagnosis Date   Adenomatous polyp of colon     Diabetes mellitus without complication (HCC)     Diverticulosis     Hyperlipidemia     Hypertension     Internal hemorrhoids            Surgical History:      Past Surgical History:  Procedure Laterality Date   COLONOSCOPY       POLYPECTOMY       top plate of teeth extracted              Home Medications:  Allergies as of 04/27/2023   No Known Allergies         Medication List           Accurate as of April 27, 2023  3:36 PM. If you have any questions, ask your nurse or doctor.              allopurinol  100 MG tablet Commonly known as: ZYLOPRIM  Take 0.5 tablets (50 mg total) by mouth daily.    aspirin EC 81 MG tablet Take 81 mg by mouth daily.    atorvastatin  40 MG tablet Commonly known as: LIPITOR Take 1 tablet (40 mg total) by mouth daily.    DropSafe Alcohol Prep 70 % Pads Test BS BID Dx E11.22    fish oil-omega-3 fatty acids 1000 MG capsule Take 1 g by mouth daily.    labetalol 200 MG tablet Commonly known as: NORMODYNE Take 200 mg by mouth 2 (two) times daily.    levofloxacin  750 MG tablet Commonly known as: Levaquin  Take 1 tablet (750 mg total) by mouth daily. Take 1 hour prior to your prostate biopsy procedure.    Mitigare  0.6 MG Caps Generic drug: Colchicine  2 tablets at pain onset, may repeat x1in 1 hour. No more then 3 tablets in 24 hours    multivitamin tablet Take 1 tablet by mouth daily.    OVER THE COUNTER MEDICATION Take 250 mg by mouth 2 (two) times daily. Super beta prostate    sildenafil  100 MG tablet Commonly known as: VIAGRA  Take 0.5 tablets (50 mg total) by mouth as needed for erectile dysfunction.    sitaGLIPtin  100 MG tablet Commonly known as: Januvia  Take 1 tablet (100 mg  total) by mouth daily.    telmisartan 20 MG tablet Commonly known as: MICARDIS Take 20 mg by mouth daily.    True Metrix Air Glucose Meter w/Device Kit Test BS BID Dx E11.22    True Metrix Blood Glucose Test test strip Generic drug: glucose blood Test BS BID Dx E11.22    True Metrix Level 1 Low Soln Use w/ glucose monitor Dx E11.22    TRUEplus Lancets 33G Misc Test BS BID Dx E11.22             Allergies:  Allergies  No Known Allergies     Family History:      Family History  Problem Relation Age of Onset   Stroke Mother     Diabetes Father     Prostate cancer Father          prostate   Diabetes Sister     Diabetes Sister     Colon cancer Neg Hx     Esophageal  cancer Neg Hx     Rectal cancer Neg Hx     Stomach cancer Neg Hx     Colon polyps Neg Hx            Social History:  reports that he has never smoked. He has never used smokeless tobacco. He reports that he does not drink alcohol and does not use drugs.   ROS: All other review of systems were reviewed and are negative except what is noted above in HPI   Physical Exam: BP (!) 143/70   Pulse 82   Constitutional:  Alert and oriented, No acute distress. HEENT: [place] AT, moist mucus membranes.  Trachea midline, no masses. Cardiovascular: No clubbing, cyanosis, or edema. Respiratory: Normal respiratory effort, no increased work of breathing. GI: Abdomen is soft, nontender, nondistended, no abdominal masses GU: No CVA tenderness.  Lymph: No cervical or inguinal lymphadenopathy. Skin: No rashes, bruises or suspicious lesions. Neurologic: Grossly intact, no focal deficits, moving all 4 extremities. Psychiatric: Normal mood and affect.   Laboratory Data: Recent Labs       Lab Results  Component Value Date    WBC 7.4 01/12/2023    HGB 12.7 (L) 01/12/2023    HCT 38.9 01/12/2023    MCV 90 01/12/2023    PLT 284 01/12/2023        Recent Labs       Lab Results  Component Value Date    CREATININE  1.58 (H) 01/12/2023        Recent Labs       Lab Results  Component Value Date    PSA 2.0 03/28/2014    PSA 2.0 06/15/2013        Recent Labs  No results found for: TESTOSTERONE     Recent Labs       Lab Results  Component Value Date    HGBA1C 6.9 (H) 01/12/2023        Urinalysis Labs (Brief)          Component Value Date/Time    APPEARANCEUR Clear 02/25/2023 0901    GLUCOSEU Negative 02/25/2023 0901    BILIRUBINUR Negative 02/25/2023 0901    PROTEINUR Trace 02/25/2023 0901    NITRITE Negative 02/25/2023 0901    LEUKOCYTESUR Negative 02/25/2023 0901        Recent Labs       Lab Results  Component Value Date    LABMICR Comment 02/25/2023        Pertinent Imaging:   No results found for this or any previous visit.   No results found for this or any previous visit.   No results found for this or any previous visit.   No results found for this or any previous visit.   Results for orders placed during the hospital encounter of 01/18/17   US  RENAL   Narrative CLINICAL DATA:  73 year old hypertensive diabetic male with chronic kidney disease stage 3. Initial encounter.   EXAM: RENAL / URINARY TRACT ULTRASOUND COMPLETE   COMPARISON:  05/19/2016 CT.   FINDINGS: Right Kidney:   Length: 10.8 cm. Echogenicity within normal limits. No hydronephrosis. Lower pole 2.6 x 2.8 x 2.7 cm cyst.   Left Kidney:   Length: 11 cm. Echogenicity within normal limits. No mass or hydronephrosis visualized.   Bladder:   Under distended. Circumferential wall thickening may be related to under distension.   IMPRESSION: No hydronephrosis.   Right lower pole 2.8 cm cyst.   Under distended urinary bladder.  Electronically Signed By: Elspeth Slain M.D. On: 01/18/2017 19:34   No results found for this or any previous visit.   No results found for this or any previous visit.   No results found for this or any previous visit.     Assessment & Plan:      1. Prostate cancer (HCC) (Primary) I discussed the natural history of intermediate risk prostate cancer with the patient and the various treatment options including active surveillance, RALP, IMRT, brachytherapy, cryotherapy, HIFU and ADT. After discussing the options the patient elects for brachtyherapy with SpaceOAR

## 2023-07-28 NOTE — Op Note (Signed)
 PRE-OPERATIVE DIAGNOSIS:  Adenocarcinoma of the prostate  POST-OPERATIVE DIAGNOSIS:  Same  PROCEDURE:  Procedure(s): 1. I-125 radioactive seed implantation 2. Cystoscopy 3. SpaceOAR placement  SURGEON:  Surgeon(s): Belvie Clara, MD  Radiation oncologist: Donnice Barge, MD  ANESTHESIA:  General  EBL:  Minimal  DRAINS: 16 French Foley catheter  INDICATION: Paul Williams is a 73 year old with a history of T1c prostate cancer. After discussing treatment options he has elected to proceed with brachytherapy  Description of procedure: After informed consent the patient was brought to the major OR, placed on the table and administered general anesthesia. He was then moved to the modified lithotomy position with his perineum perpendicular to the floor. His perineum and genitalia were then sterilely prepped. An official timeout was then performed. A 16 French Foley catheter was then placed in the bladder and filled with dilute contrast, a rectal tube was placed in the rectum and the transrectal ultrasound probe was placed in the rectum and affixed to the stand. He was then sterilely draped.  Real time ultrasonography was used along with the seed planning software [employer] Prostate vs. 4.2.21. This was used to develop the seed plan including the number of needles as well as number of seeds required for complete and adequate coverage. Real-time ultrasonography was then used along with the previously developed plan and the Nucletron device to implant a total of 66 seeds using 18 needles. This proceeded without difficulty or complication.  We then proceeded to mix the SpaceOAR using the kit supplied from the manufacturer. Once this was complete we placed a sinal needle into the perirectal fat between the rectum and the prostate. Once this was accomplished we injected 2cc of normal saline to hydrodissect the plain. We then instilled the the SpaceOAR through the spinal needle and noted good distribution  in the perirectal fat.    A Foley catheter was then removed as well as the transrectal ultrasound probe and rectal probe. Flexible cystoscopy was then performed using the 17 French flexible scope which revealed a normal urethra throughout its length down to the sphincter which appeared intact. The prostatic urethra revealed bilobar hypertrophy but no evidence of obstruction, seeds, spacers or lesions. The bladder was then entered and fully and systematically inspected. The ureteral orifices were noted to be of normal configuration and position. The mucosa revealed no evidence of tumors. There were also no stones identified within the bladder. I noted no seeds or spacers on the floor of the bladder and retroflexion of the scope revealed no seeds protruding from the base of the prostate.  The cystoscope was then removed and a new 16 French Foley catheter was then inserted and the balloon was filled with 10 cc of sterile water . This was connected to closed system drainage and the patient was awakened and taken to recovery room in stable and satisfactory condition. He tolerated procedure well and there were no intraoperative complications.

## 2023-07-29 ENCOUNTER — Encounter (HOSPITAL_COMMUNITY): Payer: Self-pay | Admitting: Urology

## 2023-07-29 NOTE — Anesthesia Postprocedure Evaluation (Signed)
 Anesthesia Post Note  Patient: Ah Rosman  Procedure(s) Performed: INSERTION, RADIATION SOURCE, PROSTATE, SPACEOAR     Patient location during evaluation: PACU Anesthesia Type: General Level of consciousness: awake and alert Pain management: pain level controlled Vital Signs Assessment: post-procedure vital signs reviewed and stable Respiratory status: spontaneous breathing, nonlabored ventilation, respiratory function stable and patient connected to nasal cannula oxygen Cardiovascular status: blood pressure returned to baseline and stable Postop Assessment: no apparent nausea or vomiting Anesthetic complications: no   No notable events documented.  Last Vitals:  Vitals:   07/28/23 1615 07/28/23 1645  BP: (!) 138/105 (!) 141/79  Pulse: 75 71  Resp:    Temp:    SpO2: 100% 95%    Last Pain:  Vitals:   07/28/23 1700  TempSrc:   PainSc: 5                  Amijah Timothy P Shyhiem Beeney

## 2023-08-01 ENCOUNTER — Ambulatory Visit

## 2023-08-01 ENCOUNTER — Other Ambulatory Visit: Payer: Self-pay | Admitting: Urology

## 2023-08-01 VITALS — BP 156/90 | HR 66 | Ht 65.0 in | Wt 237.0 lb

## 2023-08-01 DIAGNOSIS — C61 Malignant neoplasm of prostate: Secondary | ICD-10-CM

## 2023-08-01 DIAGNOSIS — Z0000 Encounter for general adult medical examination without abnormal findings: Secondary | ICD-10-CM | POA: Diagnosis not present

## 2023-08-01 NOTE — Patient Instructions (Signed)
 Paul Williams , Thank you for taking time out of your busy schedule to complete your Annual Wellness Visit with me. I enjoyed our conversation and look forward to speaking with you again next year. I, as well as your care team,  appreciate your ongoing commitment to your health goals. Please review the following plan we discussed and let me know if I can assist you in the future. Your Game plan/ To Do List    Follow up Visits: Next Medicare AWV with our clinical staff: 08/01/24 at 10:00a.m.   Have you seen your provider in the last 6 months (3 months if uncontrolled diabetes)? Yes Next Office Visit with your provider: 09/12/23 at 11:10a.m.  Clinician Recommendations:  Aim for 30 minutes of exercise or brisk walking, 6-8 glasses of water , and 5 servings of fruits and vegetables each day.       This is a list of the screening recommended for you and due dates:  Health Maintenance  Topic Date Due   Pneumococcal Vaccine for age over 57 (2 of 2 - PPSV23, PCV20, or PCV21) 09/30/2016   Complete foot exam   07/01/2023   Medicare Annual Wellness Visit  07/21/2023   Flu Shot  08/05/2023   Eye exam for diabetics  01/10/2024   Hemoglobin A1C  01/18/2024   Yearly kidney health urinalysis for diabetes  05/15/2024   Yearly kidney function blood test for diabetes  07/17/2024   DTaP/Tdap/Td vaccine (2 - Td or Tdap) 09/03/2026   Colon Cancer Screening  10/28/2027   Hepatitis C Screening  Completed   Zoster (Shingles) Vaccine  Completed   Hepatitis B Vaccine  Aged Out   HPV Vaccine  Aged Out   Meningitis B Vaccine  Aged Out   COVID-19 Vaccine  Discontinued    Advanced directives: (Declined) Advance directive discussed with you today. Even though you declined this today, please call our office should you change your mind, and we can give you the proper paperwork for you to fill out. Advance Care Planning is important because it:  [x]  Makes sure you receive the medical care that is consistent with your  values, goals, and preferences  [x]  It provides guidance to your family and loved ones and reduces their decisional burden about whether or not they are making the right decisions based on your wishes.  Follow the link provided in your after visit summary or read over the paperwork we have mailed to you to help you started getting your Advance Directives in place. If you need assistance in completing these, please reach out to us  so that we can help you!  See attachments for Preventive Care and Fall Prevention Tips.

## 2023-08-01 NOTE — Progress Notes (Signed)
 Subjective:   Paul Williams is a 73 y.o. who presents for a Medicare Wellness preventive visit.  As a reminder, Annual Wellness Visits don't include a physical exam, and some assessments may be limited, especially if this visit is performed virtually. We may recommend an in-person follow-up visit with your provider if needed.  Visit Complete: Virtual I connected with  Siraj Delatte on 08/01/23 by a audio enabled telemedicine application and verified that I am speaking with the correct person using two identifiers.  Patient Location: Home  Provider Location: Home Office  I discussed the limitations of evaluation and management by telemedicine. The patient expressed understanding and agreed to proceed.  Vital Signs: Because this visit was a virtual/telehealth visit, some criteria may be missing or patient reported. Any vitals not documented were not able to be obtained and vitals that have been documented are patient reported.  VideoDeclined- This patient declined Librarian, academic. Therefore the visit was completed with audio only.  Persons Participating in Visit: Patient.  AWV Questionnaire: No: Patient Medicare AWV questionnaire was not completed prior to this visit.  Cardiac Risk Factors include: advanced age (>51men, >77 women);diabetes mellitus;dyslipidemia;hypertension;obesity (BMI >30kg/m2)     Objective:    Today's Vitals   08/01/23 0932  BP: (!) 156/90  Pulse: 66  Weight: 237 lb (107.5 kg)  Height: 5' 5 (1.651 m)   Body mass index is 39.44 kg/m.     08/01/2023    9:37 AM 07/18/2023    9:59 AM 07/07/2023   11:30 AM 05/06/2023   12:22 PM 07/21/2022    1:47 PM 07/16/2021    3:08 PM 07/02/2020    9:09 AM  Advanced Directives  Does Patient Have a Medical Advance Directive? No No No No No No No  Would patient like information on creating a medical advance directive?    Yes (ED - Information included in AVS) No - Patient declined No - Patient  declined No - Patient declined    Current Medications (verified) Outpatient Encounter Medications as of 08/01/2023  Medication Sig   Alcohol Swabs (DROPSAFE ALCOHOL PREP) 70 % PADS Test BS BID Dx E11.22   allopurinol  (ZYLOPRIM ) 100 MG tablet Take 0.5 tablets (50 mg total) by mouth daily.   aspirin EC 81 MG tablet Take 81 mg by mouth in the morning.   atorvastatin  (LIPITOR) 40 MG tablet Take 1 tablet (40 mg total) by mouth daily. (Patient taking differently: Take 40 mg by mouth every evening.)   Blood Glucose Calibration (TRUE METRIX LEVEL 1) Low SOLN Use w/ glucose monitor Dx E11.22   Blood Glucose Monitoring Suppl (TRUE METRIX AIR GLUCOSE METER) w/Device KIT Test BS BID Dx E11.22   fish oil-omega-3 fatty acids 1000 MG capsule Take 1 g by mouth in the morning.   glucose blood (TRUE METRIX BLOOD GLUCOSE TEST) test strip Test BS BID Dx E11.22   K-PHOS 500 MG tablet Take 500 mg by mouth 2 (two) times daily.   labetalol (NORMODYNE) 200 MG tablet Take 200 mg by mouth 2 (two) times daily.   Misc Natural Products (SUPERIOR PROSTATE PO) Take 1 capsule by mouth daily. Super Beta Prostate   MITIGARE  0.6 MG CAPS 2 tablets at pain onset, may repeat x1in 1 hour. No more then 3 tablets in 24 hours (Patient taking differently: Take 0.6-1.2 mg by mouth daily as needed (gout flares.).)   Multiple Vitamin (MULTIVITAMIN) tablet Take 1 tablet by mouth in the morning.   sitaGLIPtin  (JANUVIA ) 100 MG  tablet Take 1 tablet (100 mg total) by mouth daily.   telmisartan (MICARDIS) 20 MG tablet Take 20 mg by mouth in the morning.   traMADol  (ULTRAM ) 50 MG tablet Take 1 tablet (50 mg total) by mouth every 6 (six) hours as needed.   TRUEplus Lancets 33G MISC Test BS BID Dx E11.22   No facility-administered encounter medications on file as of 08/01/2023.    Allergies (verified) Patient has no known allergies.   History: Past Medical History:  Diagnosis Date   Adenomatous polyp of colon    CKD (chronic kidney  disease)    Diabetes mellitus without complication (HCC)    Diverticulosis    Elevated PSA    Hyperlipidemia    Hypertension    Internal hemorrhoids    Prostate cancer (HCC) 05/2023   Sleep apnea    Past Surgical History:  Procedure Laterality Date   COLONOSCOPY     POLYPECTOMY     PROSTATE BIOPSY     RADIOACTIVE SEED IMPLANT N/A 07/28/2023   Procedure: INSERTION, RADIATION SOURCE, PROSTATE, SPACEOAR;  Surgeon: [name] CROME, MD;  Location: [facility];  Service: Urology;  Laterality: N/A;   top plate of teeth extracted     Family History  Problem Relation Age of Onset   Stroke Mother    Diabetes Father    Prostate cancer Father        prostate   Diabetes Sister    Diabetes Sister    Colon cancer Neg Hx    Esophageal cancer Neg Hx    Rectal cancer Neg Hx    Stomach cancer Neg Hx    Colon polyps Neg Hx    Social History   Socioeconomic History   Marital status: Married    Spouse name: Arlene   Number of children: 1   Years of education: Not on file   Highest education level: Not on file  Occupational History   Occupation: retired    Associate Professor: [employer]  Tobacco Use   Smoking status: Never   Smokeless tobacco: Never  Vaping Use   Vaping status: Never Used  Substance and Sexual Activity   Alcohol use: Never   Drug use: No   Sexual activity: Yes    Partners: Female  Other Topics Concern   Not on file  Social History Narrative   Lives home with wife. They have one daughter in [place].   Social Drivers of Corporate investment banker Strain: Low Risk  (08/01/2023)   Overall Financial Resource Strain (CARDIA)    Difficulty of Paying Living Expenses: Not hard at all  Food Insecurity: No Food Insecurity (08/01/2023)   Hunger Vital Sign    Worried About Running Out of Food in the Last Year: Never true    Ran Out of Food in the Last Year: Never true  Transportation Needs: No Transportation Needs (08/01/2023)   PRAPARE - Scientist, research (physical sciences) (Medical): No    Lack of Transportation (Non-Medical): No  Physical Activity: Sufficiently Active (08/01/2023)   Exercise Vital Sign    Days of Exercise per Week: 7 days    Minutes of Exercise per Session: 60 min  Stress: No Stress Concern Present (08/01/2023)   [employer] of Occupational Health - Occupational Stress Questionnaire    Feeling of Stress: Not at all  Social Connections: Moderately Integrated (08/01/2023)   Social Connection and Isolation Panel    Frequency of Communication with Friends and Family: More than three  times a week    Frequency of Social Gatherings with Friends and Family: Once a week    Attends Religious Services: More than 4 times per year    Active Member of [employer] or Organizations: No    Attends Engineer, structural: Never    Marital Status: Married    Tobacco Counseling Counseling given: Yes    Clinical Intake:  Pre-visit preparation completed: Yes  Pain : No/denies pain     BMI - recorded: 39.44 Nutritional Status: BMI > 30  Obese Nutritional Risks: None Diabetes: Yes CBG done?: No (pe rpt this morning 111)  Lab Results  Component Value Date   HGBA1C 7.0 (H) 07/18/2023   HGBA1C 7.0 (H) 05/16/2023   HGBA1C 6.9 (H) 01/12/2023     How often do you need to have someone help you when you read instructions, pamphlets, or other written materials from your doctor or pharmacy?: 1 - Never  Interpreter Needed?: No  Information entered by :: alia t/cma   Activities of Daily Living     08/01/2023    9:36 AM 07/18/2023   10:03 AM  In your present state of health, do you have any difficulty performing the following activities:  Hearing? 0   Vision? 0   Difficulty concentrating or making decisions? 0   Walking or climbing stairs? 0   Dressing or bathing? 0   Doing errands, shopping? 0 0  Preparing Food and eating ? N   Using the Toilet? N   In the past six months, have you accidently leaked urine? N   Do you  have problems with loss of bowel control? N   Managing your Medications? N   Managing your Finances? N   Housekeeping or managing your Housekeeping? N     Patient Care Team: Dettinger, Fonda LABOR, MD as PCP - General (Family Medicine) Rachele Gaynell RAMAN, MD as Consulting Physician (Nephrology) Pa, [facility] Vertell Pont, RN as Oncology Nurse Navigator  I have updated your Care Teams any recent Medical Services you may have received from other providers in the past year.     Assessment:   This is a routine wellness examination for Colbe.  Hearing/Vision screen Hearing Screening - Comments:: Pt denies hearing dif Vision Screening - Comments:: Pt denies vision dif/last ov 2025   Goals Addressed               This Visit's Progress     Patient Stated (pt-stated)   On track     Patients goal is to maintain his current activity level and to lose weight        Depression Screen     08/01/2023    9:43 AM 05/06/2023   12:40 PM 01/12/2023   10:04 AM 10/04/2022   10:02 AM 07/21/2022    1:48 PM 07/01/2022    9:22 AM 09/30/2021   10:07 AM  PHQ 2/9 Scores  PHQ - 2 Score 0 0 0 0 0 0 0  PHQ- 9 Score    0 0      Fall Risk     08/01/2023    9:33 AM 01/12/2023   10:04 AM 10/04/2022   10:02 AM 07/21/2022    1:47 PM 07/01/2022    9:22 AM  Fall Risk   Falls in the past year? 0 0 0 0 0  Number falls in past yr: 0   0   Injury with Fall? 0   0   Risk for fall  due to : No Fall Risks   No Fall Risks   Follow up Falls evaluation completed   Falls prevention discussed     MEDICARE RISK AT HOME:  Medicare Risk at Home Any stairs in or around the home?: No If so, are there any without handrails?: No Home free of loose throw rugs in walkways, pet beds, electrical cords, etc?: Yes Adequate lighting in your home to reduce risk of falls?: Yes Life alert?: No Use of a cane, walker or w/c?: No Grab bars in the bathroom?: No Shower chair or bench in shower?: Yes Elevated toilet seat or a  handicapped toilet?: Yes  TIMED UP AND GO:  Was the test performed?  no  Cognitive Function: 6CIT completed    06/22/2018   10:15 AM  MMSE - Mini Mental State Exam  Orientation to time 5  Orientation to Place 5  Registration 3  Attention/ Calculation 5  Recall 3  Language- name 2 objects 2  Language- repeat 1  Language- follow 3 step command 1  Language- read & follow direction 1  Write a sentence 1  Copy design 1  Total score 28        08/01/2023    9:43 AM 07/21/2022    1:50 PM 07/16/2021    3:01 PM 06/26/2019    9:27 AM  6CIT Screen  What Year? 0 points 0 points 0 points 0 points  What month? 0 points 0 points 0 points 0 points  What time? 0 points 0 points 0 points 0 points  Count back from 20 0 points 0 points 0 points 0 points  Months in reverse 0 points 0 points 0 points 0 points  Repeat phrase 0 points 0 points 0 points 2 points  Total Score 0 points 0 points 0 points 2 points    Immunizations Immunization History  Administered Date(s) Administered   Moderna Covid-19 Vaccine Bivalent Booster 30yrs & up 10/15/2020, 01/14/2021   Moderna Sars-Covid-2 Vaccination 03/04/2020, 03/20/2020, 04/04/2020, 04/16/2020   Pneumococcal Conjugate-13 08/05/2016   Tdap 09/02/2016   Zoster Recombinant(Shingrix ) 09/30/2021, 12/21/2021    Screening Tests Health Maintenance  Topic Date Due   Pneumococcal Vaccine: 50+ Years (2 of 2 - PPSV23, PCV20, or PCV21) 09/30/2016   FOOT EXAM  07/01/2023   Medicare Annual Wellness (AWV)  07/21/2023   INFLUENZA VACCINE  08/05/2023   OPHTHALMOLOGY EXAM  01/10/2024   HEMOGLOBIN A1C  01/18/2024   Diabetic kidney evaluation - Urine ACR  05/15/2024   Diabetic kidney evaluation - eGFR measurement  07/17/2024   DTaP/Tdap/Td (2 - Td or Tdap) 09/03/2026   Colonoscopy  10/28/2027   Hepatitis C Screening  Completed   Zoster Vaccines- Shingrix   Completed   Hepatitis B Vaccines  Aged Out   HPV VACCINES  Aged Out   Meningococcal B Vaccine  Aged  Out   COVID-19 Vaccine  Discontinued    Health Maintenance  Health Maintenance Due  Topic Date Due   Pneumococcal Vaccine: 50+ Years (2 of 2 - PPSV23, PCV20, or PCV21) 09/30/2016   FOOT EXAM  07/01/2023   Medicare Annual Wellness (AWV)  07/21/2023   Health Maintenance Items Addressed: See Nurse Notes at the end of this note  Additional Screening:  Vision Screening: Recommended annual ophthalmology exams for early detection of glaucoma and other disorders of the eye. Would you like a referral to an eye doctor? No    Dental Screening: Recommended annual dental exams for proper oral hygiene  Community Resource Referral /  Chronic Care Management: CRR required this visit?  No   CCM required this visit?  No   Plan:    I have personally reviewed and noted the following in the patient's chart:   Medical and social history Use of alcohol, tobacco or illicit drugs  Current medications and supplements including opioid prescriptions. Patient is not currently taking opioid prescriptions. Functional ability and status Nutritional status Physical activity Advanced directives List of other physicians Hospitalizations, surgeries, and ER visits in previous 12 months Vitals Screenings to include cognitive, depression, and falls Referrals and appointments  In addition, I have reviewed and discussed with patient certain preventive protocols, quality metrics, and best practice recommendations. A written personalized care plan for preventive services as well as general preventive health recommendations were provided to patient.   Ozie Ned, CMA   08/01/2023   After Visit Summary: (Declined) Due to this being a telephonic visit, with patients personalized plan was offered to patient but patient Declined AVS at this time   Notes: Nothing significant to report at this time.

## 2023-08-03 ENCOUNTER — Ambulatory Visit: Admitting: Urology

## 2023-08-03 ENCOUNTER — Encounter: Payer: Self-pay | Admitting: Urology

## 2023-08-03 VITALS — BP 130/82 | HR 91

## 2023-08-03 DIAGNOSIS — C61 Malignant neoplasm of prostate: Secondary | ICD-10-CM

## 2023-08-03 MED ORDER — CIPROFLOXACIN HCL 500 MG PO TABS
500.0000 mg | ORAL_TABLET | Freq: Once | ORAL | Status: AC
  Administered 2023-08-03: 500 mg via ORAL

## 2023-08-03 NOTE — Progress Notes (Signed)
 Fill and Pull Catheter Removal  Patient is present today for a catheter removal.  of sterile water  was instilled into the bladder when the patient felt the urge to urinate. 10ml of water  was then drained from the balloon.  A 16FR foley cath was removed from the bladder no complications were noted .  Foley catheter intact and time of removal. Patient as then given some time to void on their own.  Patient can void  50ml on their own after some time.  Patient tolerated well.  One oral prophylactic antibiotic given per MD orders  Performed by: Veleria, CMA  Follow up/ Additional notes: MD to see after

## 2023-08-03 NOTE — Patient Instructions (Signed)

## 2023-08-03 NOTE — Progress Notes (Unsigned)
 08/03/2023 10:10 AM   Paul Williams 1950-08-22 [id number]  Referring provider: Dettinger, Fonda LABOR, MD [address] [place],  [place] [address]     HPI: [name] is a 73yo here for followup after brachytherapy. Voiding trial passed. He denies any pelvic pain. No hematuria   PMH: Past Medical History:  Diagnosis Date   Adenomatous polyp of colon    CKD (chronic kidney disease)    Diabetes mellitus without complication (HCC)    Diverticulosis    Elevated PSA    Hyperlipidemia    Hypertension    Internal hemorrhoids    Prostate cancer (HCC) 05/2023   Sleep apnea     Surgical History: Past Surgical History:  Procedure Laterality Date   COLONOSCOPY     POLYPECTOMY     PROSTATE BIOPSY     RADIOACTIVE SEED IMPLANT N/A 07/28/2023   Procedure: INSERTION, RADIATION SOURCE, PROSTATE, SPACEOAR;  Surgeon: [name] CROME, MD;  Location: [facility];  Service: Urology;  Laterality: N/A;   top plate of teeth extracted      Home Medications:  Allergies as of 08/03/2023   No Known Allergies      Medication List        Accurate as of August 03, 2023 10:10 AM. If you have any questions, ask your nurse or doctor.          allopurinol  100 MG tablet Commonly known as: ZYLOPRIM  Take 0.5 tablets (50 mg total) by mouth daily.   aspirin EC 81 MG tablet Take 81 mg by mouth in the morning.   atorvastatin  40 MG tablet Commonly known as: LIPITOR Take 1 tablet (40 mg total) by mouth daily. What changed: when to take this   DropSafe Alcohol Prep 70 % Pads Test BS BID Dx E11.22   fish oil-omega-3 fatty acids 1000 MG capsule Take 1 g by mouth in the morning.   K-Phos 500 MG tablet Generic drug: potassium phosphate (monobasic) Take 500 mg by mouth 2 (two) times daily.   labetalol 200 MG tablet Commonly known as: NORMODYNE Take 200 mg by mouth 2 (two) times daily.   Mitigare  0.6 MG Caps Generic drug: Colchicine  2 tablets at pain onset, may repeat x1in 1 hour. No more then 3  tablets in 24 hours What changed:  how much to take how to take this when to take this reasons to take this additional instructions   multivitamin tablet Take 1 tablet by mouth in the morning.   sitaGLIPtin  100 MG tablet Commonly known as: Januvia  Take 1 tablet (100 mg total) by mouth daily.   SUPERIOR PROSTATE PO Take 1 capsule by mouth daily. Super Beta Prostate   telmisartan 20 MG tablet Commonly known as: MICARDIS Take 20 mg by mouth in the morning.   traMADol  50 MG tablet Commonly known as: Ultram  Take 1 tablet (50 mg total) by mouth every 6 (six) hours as needed.   True Metrix Air Glucose Meter w/Device Kit Test BS BID Dx E11.22   True Metrix Blood Glucose Test test strip Generic drug: glucose blood Test BS BID Dx E11.22   True Metrix Level 1 Low Soln Use w/ glucose monitor Dx E11.22   TRUEplus Lancets 33G Misc Test BS BID Dx E11.22        Allergies: No Known Allergies  Family History: Family History  Problem Relation Age of Onset   Stroke Mother    Diabetes Father    Prostate cancer Father  prostate   Diabetes Sister    Diabetes Sister    Colon cancer Neg Hx    Esophageal cancer Neg Hx    Rectal cancer Neg Hx    Stomach cancer Neg Hx    Colon polyps Neg Hx     Social History:  reports that he has never smoked. He has never used smokeless tobacco. He reports that he does not drink alcohol and does not use drugs.  ROS: All other review of systems were reviewed and are negative except what is noted above in HPI  Physical Exam: BP 130/82   Pulse 91   Constitutional:  Alert and oriented, No acute distress. HEENT: [place] AT, moist mucus membranes.  Trachea midline, no masses. Cardiovascular: No clubbing, cyanosis, or edema. Respiratory: Normal respiratory effort, no increased work of breathing. GI: Abdomen is soft, nontender, nondistended, no abdominal masses GU: No CVA tenderness.  Lymph: No cervical or inguinal lymphadenopathy. Skin: No  rashes, bruises or suspicious lesions. Neurologic: Grossly intact, no focal deficits, moving all 4 extremities. Psychiatric: Normal mood and affect.  Laboratory Data: Lab Results  Component Value Date   WBC 7.5 07/18/2023   HGB 13.1 07/18/2023   HCT 41.2 07/18/2023   MCV 89.2 07/18/2023   PLT 241 07/18/2023    Lab Results  Component Value Date   CREATININE 1.39 (H) 07/18/2023    Lab Results  Component Value Date   PSA 2.0 03/28/2014   PSA 2.0 06/15/2013    No results found for: TESTOSTERONE  Lab Results  Component Value Date   HGBA1C 7.0 (H) 07/18/2023    Urinalysis    Component Value Date/Time   APPEARANCEUR Clear 02/25/2023 0901   GLUCOSEU Negative 02/25/2023 0901   BILIRUBINUR Negative 02/25/2023 0901   PROTEINUR Trace 02/25/2023 0901   NITRITE Negative 02/25/2023 0901   LEUKOCYTESUR Negative 02/25/2023 0901    Lab Results  Component Value Date   LABMICR 9.1 05/16/2023    Pertinent Imaging:  No results found for this or any previous visit.  No results found for this or any previous visit.  No results found for this or any previous visit.  No results found for this or any previous visit.  Results for orders placed during the hospital encounter of 01/18/17  US  RENAL  Narrative CLINICAL DATA:  73 year old hypertensive diabetic male with chronic kidney disease stage 3. Initial encounter.  EXAM: RENAL / URINARY TRACT ULTRASOUND COMPLETE  COMPARISON:  05/19/2016 CT.  FINDINGS: Right Kidney:  Length: 10.8 cm. Echogenicity within normal limits. No hydronephrosis. Lower pole 2.6 x 2.8 x 2.7 cm cyst.  Left Kidney:  Length: 11 cm. Echogenicity within normal limits. No mass or hydronephrosis visualized.  Bladder:  Under distended. Circumferential wall thickening may be related to under distension.  IMPRESSION: No hydronephrosis.  Right lower pole 2.8 cm cyst.  Under distended urinary bladder.   Electronically Signed By: Elspeth Slain M.D. On: 01/18/2017 19:34  No results found for this or any previous visit.  No results found for this or any previous visit.  No results found for this or any previous visit.   Assessment & Plan:    1. Prostate cancer (HCC) (Primary) Followup 3 months with PSA - Bladder Voiding Trial - ciprofloxacin  (CIPRO ) tablet 500 mg   No follow-ups on file.  Belvie Clara, MD  [facility] [place]

## 2023-08-12 ENCOUNTER — Telehealth: Payer: Self-pay | Admitting: *Deleted

## 2023-08-12 NOTE — Telephone Encounter (Signed)
 Called patient to remind of sim and MRI for 08-18-23- arrival time- 8:15 am  @ [facility] and his MRI will be on 08-18-23 @ 9:30 am @ [facility], spoke with patient and he is aware of these appts. and the instructions

## 2023-08-17 NOTE — Progress Notes (Signed)
  Radiation Oncology         [phone] ________________________________  Name: Delando Satter MRN: [id number]  Date: 08/18/2023  DOB: 05-22-1950  COMPLEX SIMULATION NOTE  NARRATIVE:  The patient was brought to the CT Simulation planning suite today following prostate seed implantation approximately one month ago.  Identity was confirmed.  All relevant records and images related to the planned course of therapy were reviewed.  Then, the patient was set-up supine.  CT images were obtained.  The CT images were loaded into the planning software.  Then the prostate and rectum were contoured.  Treatment planning then occurred.  The implanted iodine 125 seeds were identified by the physics staff for projection of radiation distribution  I have requested : 3D Simulation  I have requested a DVH of the following structures: Prostate and rectum.    ________________________________  [name], M.D.

## 2023-08-17 NOTE — Progress Notes (Signed)
 Radiation Oncology         [phone] ________________________________  Name: Paul Williams MRN: [id number]  Date: 08/18/2023  DOB: 30-Dec-1950  Post-Seed Follow-Up Visit Note  CC: Dettinger, Fonda LABOR, MD  [name] CROME, MD  Diagnosis:   73 y.o. gentleman with Stage T1c adenocarcinoma of the prostate with Gleason score of 3+4, and PSA of 6.5.     ICD-10-CM   1. Prostate cancer (HCC)  C61       Interval Since Last Radiation:  3 weeks 07/28/23:  Insertion of radioactive I-125 seeds into the prostate gland; 145 Gy, definitive therapy with placement of SpaceOAR gel.  Narrative:  The patient returns today for routine follow-up.  He is complaining of increased urinary frequency and urinary hesitation symptoms. He filled out a questionnaire regarding urinary function today providing and overall IPSS score of 13 characterizing his symptoms as moderate and tolerable.  He specifically denies dysuria, straining to void,gross hematuria, excessive daytime frequency or incontinence. His pre-implant score was 6. He denies any abdominal pain or bowel symptoms aside from some increased gassiness. He reports a healthy appetite and is maintaining his weight. He has good energy and overall, is quite pleased with his progress to date.  ALLERGIES:  has no known allergies.  Meds: Current Outpatient Medications  Medication Sig Dispense Refill   Alcohol Swabs (DROPSAFE ALCOHOL PREP) 70 % PADS Test BS BID Dx E11.22 200 each 3   allopurinol  (ZYLOPRIM ) 100 MG tablet Take 0.5 tablets (50 mg total) by mouth daily. 45 tablet 3   aspirin EC 81 MG tablet Take 81 mg by mouth in the morning.     atorvastatin  (LIPITOR) 40 MG tablet Take 1 tablet (40 mg total) by mouth daily. (Patient taking differently: Take 40 mg by mouth every evening.) 90 tablet 3   Blood Glucose Calibration (TRUE METRIX LEVEL 1) Low SOLN Use w/ glucose monitor Dx E11.22 3 each 2   Blood Glucose Monitoring Suppl (TRUE METRIX AIR GLUCOSE METER)  w/Device KIT Test BS BID Dx E11.22 1 kit 0   fish oil-omega-3 fatty acids 1000 MG capsule Take 1 g by mouth in the morning.     glucose blood (TRUE METRIX BLOOD GLUCOSE TEST) test strip Test BS BID Dx E11.22 200 strip 3   K-PHOS 500 MG tablet Take 500 mg by mouth 2 (two) times daily.     labetalol (NORMODYNE) 200 MG tablet Take 200 mg by mouth 2 (two) times daily.     Misc Natural Products (SUPERIOR PROSTATE PO) Take 1 capsule by mouth daily. Super Beta Prostate     MITIGARE  0.6 MG CAPS 2 tablets at pain onset, may repeat x1in 1 hour. No more then 3 tablets in 24 hours (Patient taking differently: Take 0.6-1.2 mg by mouth daily as needed (gout flares.).) 30 capsule 3   Multiple Vitamin (MULTIVITAMIN) tablet Take 1 tablet by mouth in the morning.     sitaGLIPtin  (JANUVIA ) 100 MG tablet Take 1 tablet (100 mg total) by mouth daily. 90 tablet 3   telmisartan (MICARDIS) 20 MG tablet Take 20 mg by mouth in the morning.     traMADol  (ULTRAM ) 50 MG tablet Take 1 tablet (50 mg total) by mouth every 6 (six) hours as needed. 15 tablet 0   TRUEplus Lancets 33G MISC Test BS BID Dx E11.22 200 each 3   No current facility-administered medications for this encounter.    Physical Findings: In general this is a well appearing African-American male in no  acute distress. He's alert and oriented x4 and appropriate throughout the examination. Cardiopulmonary assessment is negative for acute distress and he exhibits normal effort.   Lab Findings: Lab Results  Component Value Date   WBC 7.5 07/18/2023   HGB 13.1 07/18/2023   HCT 41.2 07/18/2023   MCV 89.2 07/18/2023   PLT 241 07/18/2023    Radiographic Findings:  Patient underwent CT imaging in our clinic for post implant dosimetry. The CT will be fused with his prostate MRI that will be performed at 9:30 AM following our visit today and will be reviewed by Dr. Patrcia to confirm there is an adequate distribution of radioactive seeds throughout the prostate  gland and ensure that there are no seeds in or near the rectum. We suspect the final radiation plan and dosimetry will show appropriate coverage of the prostate gland. He understands that we will call and inform him of any unexpected findings on further review of his imaging and dosimetry.  Impression/Plan: 73 y.o. gentleman with Stage T1c adenocarcinoma of the prostate with Gleason score of 3+4, and PSA of 6.5.  The patient is recovering from the effects of radiation. His urinary symptoms should gradually improve over the next 4-6 months. We talked about this today. He is encouraged by his improvement already and is otherwise pleased with his outcome. We also talked about long-term follow-up for prostate cancer following seed implant. He understands that ongoing PSA determinations and digital rectal exams will help perform surveillance to rule out disease recurrence. He is scheduled for his posttreatment PSA on 11/03/2023 and has a follow up appointment scheduled with Dr. Sherrilee on 11/18/2023. He understands what to expect with his PSA measures. Patient was also educated today about some of the long-term effects from radiation including a small risk for rectal bleeding and possibly erectile dysfunction. We talked about some of the general management approaches to these potential complications. However, I did encourage the patient to contact our office or return at any point if he has questions or concerns related to his previous radiation and prostate cancer.    [name], PA-C

## 2023-08-17 NOTE — Progress Notes (Signed)
 Post-seed nursing interview for a diagnosis of   Patient identity verified x2.   Patient states issues as follows...  -Pain: 0/10 -Fatigue: No, still active does yard work. -Abdomen: No -Groin: No -Urinary: No issues -Bowels:  Gassy -Appetite: Not eating much per likes eat snacks.  Patient denies all other related issues at this time.  Meaningful use complete.  I-PSS (AUA) score- 13 SHIM (ED) score-  No sexual activity Urinary Management medication(s) None Urology appointment date- October 2025 with Dr. Belvie Clara at [facility].  Vitals- 138/[id number] O2 sat 100% RA.

## 2023-08-18 ENCOUNTER — Ambulatory Visit: Admitting: Family Medicine

## 2023-08-18 ENCOUNTER — Ambulatory Visit
Admission: RE | Admit: 2023-08-18 | Discharge: 2023-08-18 | Disposition: A | Discharge status: Home / Self Care | Source: Ambulatory Visit | Attending: Radiation Oncology | Admitting: Radiation Oncology

## 2023-08-18 ENCOUNTER — Ambulatory Visit
Admission: RE | Admit: 2023-08-18 | Discharge: 2023-08-18 | Disposition: A | Discharge status: Home / Self Care | Payer: Self-pay | Source: Ambulatory Visit | Attending: Urology | Admitting: Urology

## 2023-08-18 ENCOUNTER — Ambulatory Visit (HOSPITAL_COMMUNITY)
Admission: RE | Admit: 2023-08-18 | Discharge: 2023-08-18 | Disposition: A | Discharge status: Home / Self Care | Source: Ambulatory Visit | Attending: Urology | Admitting: Urology

## 2023-08-18 VITALS — BP 138/78 | HR 73 | Temp 98.1°F | Resp 18 | Wt 224.8 lb

## 2023-08-18 DIAGNOSIS — C61 Malignant neoplasm of prostate: Secondary | ICD-10-CM

## 2023-08-28 ENCOUNTER — Other Ambulatory Visit: Payer: Self-pay | Admitting: Family Medicine

## 2023-08-29 ENCOUNTER — Other Ambulatory Visit: Payer: Self-pay | Admitting: Family Medicine

## 2023-08-30 ENCOUNTER — Encounter: Payer: Self-pay | Admitting: Radiation Oncology

## 2023-08-30 DIAGNOSIS — C61 Malignant neoplasm of prostate: Secondary | ICD-10-CM | POA: Diagnosis not present

## 2023-08-30 DIAGNOSIS — Z191 Hormone sensitive malignancy status: Secondary | ICD-10-CM | POA: Diagnosis not present

## 2023-08-30 NOTE — Progress Notes (Signed)
  Radiation Oncology         [phone] ________________________________  Name: Paul Williams MRN: [id number]  Date: 08/30/2023  DOB: 12-17-1950  3D Planning Note   Prostate Brachytherapy Post-Implant Dosimetry  Diagnosis: 73 y.o. gentleman with Stage T1c adenocarcinoma of the prostate with Gleason score of 3+4, and PSA of 6.5.   Narrative: On a previous date, [name] returned following prostate seed implantation for post implant planning. He underwent CT scan complex simulation to delineate the three-dimensional structures of the pelvis and demonstrate the radiation distribution.  Since that time, the seed localization, and complex isodose planning with dose volume histograms have now been completed.  Results:   Prostate Coverage - The dose of radiation delivered to the 90% or more of the prostate gland (D90) was 120.8% of the prescription dose. This exceeds our goal of greater than 90%. Rectal Sparing - The volume of rectal tissue receiving the prescription dose or higher was 0.0 cc. This falls under our thresholds tolerance of 1.0 cc.  Impression: The prostate seed implant appears to show adequate target coverage and appropriate rectal sparing.  Plan:  The patient will continue to follow with urology for ongoing PSA determinations. I would anticipate a high likelihood for local tumor control with minimal risk for rectal morbidity.  ________________________________  [name], M.D.

## 2023-08-30 NOTE — Radiation Completion Notes (Signed)
 Patient Name: KOLTER, REAVER MRN: [id number] Date of Birth: [birth year] Referring Physician: BELVIE CLARA, M.D. Date of Service: 2023-08-30 Radiation Oncologist: Adina Barge, M.D. [facility]                             RADIATION ONCOLOGY END OF TREATMENT NOTE     Diagnosis: C61 Malignant neoplasm of prostate Staging on 2023-04-13: Prostate cancer (HCC) T=cT1c, N=cN0, M=cM0 Intent: Curative     ==========DELIVERED PLANS==========  Prostate Seed Implant Date: 2023-07-28   Plan Name: Prostate Seed Implant Site: Prostate Technique: Radioactive Seed Implant I-125 Mode: Brachytherapy Dose Per Fraction: 145 Gy Prescribed Dose (Delivered / Prescribed): 145 Gy / 145 Gy Prescribed Fxs (Delivered / Prescribed): 1 / 1     ==========ON TREATMENT VISIT DATES========== 2023-07-28     ==========UPCOMING VISITS========== 11/18/2023 [facility] [place] OFFICE VISIT CLARA BELVIE CROME, MD  11/03/2023 [facility]  09/12/2023 [facility] VISIT Dettinger, Fonda LABOR, MD

## 2023-09-12 ENCOUNTER — Other Ambulatory Visit: Payer: Self-pay

## 2023-09-12 ENCOUNTER — Ambulatory Visit: Admitting: Family Medicine

## 2023-09-12 ENCOUNTER — Encounter: Payer: Self-pay | Admitting: Family Medicine

## 2023-09-12 ENCOUNTER — Other Ambulatory Visit

## 2023-09-12 VITALS — BP 138/85 | HR 62 | Ht 65.0 in | Wt 230.0 lb

## 2023-09-12 DIAGNOSIS — I152 Hypertension secondary to endocrine disorders: Secondary | ICD-10-CM | POA: Diagnosis not present

## 2023-09-12 DIAGNOSIS — E785 Hyperlipidemia, unspecified: Secondary | ICD-10-CM | POA: Diagnosis not present

## 2023-09-12 DIAGNOSIS — E1122 Type 2 diabetes mellitus with diabetic chronic kidney disease: Secondary | ICD-10-CM | POA: Diagnosis not present

## 2023-09-12 DIAGNOSIS — N1832 Chronic kidney disease, stage 3b: Secondary | ICD-10-CM

## 2023-09-12 DIAGNOSIS — Z7984 Long term (current) use of oral hypoglycemic drugs: Secondary | ICD-10-CM

## 2023-09-12 DIAGNOSIS — E1169 Type 2 diabetes mellitus with other specified complication: Secondary | ICD-10-CM | POA: Diagnosis not present

## 2023-09-12 DIAGNOSIS — E1159 Type 2 diabetes mellitus with other circulatory complications: Secondary | ICD-10-CM | POA: Diagnosis not present

## 2023-09-12 DIAGNOSIS — N1831 Chronic kidney disease, stage 3a: Secondary | ICD-10-CM

## 2023-09-12 LAB — LIPID PANEL

## 2023-09-12 LAB — BAYER DCA HB A1C WAIVED: HB A1C (BAYER DCA - WAIVED): 6.5 % — ABNORMAL HIGH (ref 4.8–5.6)

## 2023-09-12 NOTE — Progress Notes (Signed)
 BP 138/85   Pulse 62   Ht 5' 5 (1.651 m)   Wt 230 lb (104.3 kg)   SpO2 97%   BMI 38.27 kg/m    Subjective:   Patient ID: Paul Williams, male    DOB: [birth year], 73 y.o.   MRN: [id number]  HPI: Paul Williams is a 73 y.o. male presenting on 09/12/2023 for Medical Management of Chronic Issues, Diabetes, Hyperlipidemia, and Chronic Kidney Disease   Discussed the use of AI scribe software for clinical note transcription with the patient, who gave verbal consent to proceed.  History of Present Illness   Paul Williams is a 73 year old male with prostate cancer who presents for follow-up after radiation therapy.  He underwent radiation seed therapy and temporarily required a catheter, which has since been removed without complications.  He has experienced a decreased appetite following radiation therapy, which is gradually improving. He continues to take Januvia  for diabetes management without changes to his medication regimen.  He notes some swelling in his legs but is gradually becoming more active. He is closely monitored by his wife and dog as he resumes activities.  He recalls a previous CT scan and MRI conducted a few weeks ago as part of his follow-up care. He also mentions a past elevated PSA level that led to the diagnosis of prostate cancer. He opted for radiation therapy over chemotherapy due to the treatment regimen's intensity.      Type 2 diabetes mellitus Patient comes in today for recheck of his diabetes. Patient has been currently taking Januvia . Patient is currently on an ACE inhibitor/ARB. Patient has not seen an ophthalmologist this year. Patient denies any new issues with their feet. The symptom started onset as an adult hypertension and hyperlipidemia ARE RELATED TO DM   Hypertension Patient is currently on labetalol and losartan, and their blood pressure today is 138/85. Patient denies any lightheadedness or dizziness. Patient denies headaches, blurred vision, chest pains,  shortness of breath, or weakness. Denies any side effects from medication and is content with current medication.   Hyperlipidemia Patient is coming in for recheck of his hyperlipidemia. The patient is currently taking atorvastatin . They deny any issues with myalgias or history of liver damage from it. They deny any focal numbness or weakness or chest pain.    Relevant past medical, surgical, family and social history reviewed and updated as indicated. Interim medical history since our last visit reviewed. Allergies and medications reviewed and updated.  Review of Systems  Constitutional:  Negative for chills and fever.  Eyes:  Negative for visual disturbance.  Respiratory:  Negative for shortness of breath and wheezing.   Cardiovascular:  Negative for chest pain and leg swelling.  Gastrointestinal:  Negative for abdominal pain.  Genitourinary:  Negative for dysuria and frequency.  Musculoskeletal:  Negative for back pain and gait problem.  Skin:  Negative for rash.  Neurological:  Negative for dizziness and light-headedness.  All other systems reviewed and are negative.   Per HPI unless specifically indicated above   Allergies as of 09/12/2023   No Known Allergies      Medication List        Accurate as of September 12, 2023 12:14 PM. If you have any questions, ask your nurse or doctor.          allopurinol  100 MG tablet Commonly known as: ZYLOPRIM  Take 0.5 tablets (50 mg total) by mouth daily.   aspirin EC 81 MG tablet Take 81 mg by  mouth in the morning.   atorvastatin  40 MG tablet Commonly known as: LIPITOR Take 1 tablet (40 mg total) by mouth daily. What changed: when to take this   DropSafe Alcohol Prep 70 % Pads Test BS BID Dx E11.22   fish oil-omega-3 fatty acids 1000 MG capsule Take 1 g by mouth in the morning.   K-Phos 500 MG tablet Generic drug: potassium phosphate (monobasic) Take 500 mg by mouth 2 (two) times daily.   labetalol 200 MG  tablet Commonly known as: NORMODYNE Take 200 mg by mouth 2 (two) times daily.   Mitigare  0.6 MG Caps Generic drug: Colchicine  2 tablets at pain onset, may repeat x1in 1 hour. No more then 3 tablets in 24 hours What changed:  how much to take how to take this when to take this reasons to take this additional instructions   multivitamin tablet Take 1 tablet by mouth in the morning.   sitaGLIPtin  100 MG tablet Commonly known as: Januvia  Take 1 tablet (100 mg total) by mouth daily.   SUPERIOR PROSTATE PO Take 1 capsule by mouth daily. Super Beta Prostate   telmisartan 20 MG tablet Commonly known as: MICARDIS Take 20 mg by mouth in the morning.   traMADol  50 MG tablet Commonly known as: Ultram  Take 1 tablet (50 mg total) by mouth every 6 (six) hours as needed.   True Metrix Air Glucose Meter w/Device Kit Test BS BID Dx E11.22   True Metrix Blood Glucose Test test strip Generic drug: glucose blood Test BS BID Dx E11.22   True Metrix Level 1 Low Soln Use w/ glucose monitor Dx E11.22   TRUEplus Lancets 33G Misc Test BS BID Dx E11.22         Objective:   BP 138/85   Pulse 62   Ht 5' 5 (1.651 m)   Wt 230 lb (104.3 kg)   SpO2 97%   BMI 38.27 kg/m   Wt Readings from Last 3 Encounters:  09/12/23 230 lb (104.3 kg)  08/18/23 224 lb 12.8 oz (102 kg)  08/01/23 237 lb (107.5 kg)    Physical Exam Vitals and nursing note reviewed.  Constitutional:      General: He is not in acute distress.    Appearance: He is well-developed. He is not diaphoretic.  Eyes:     General: No scleral icterus.    Conjunctiva/sclera: Conjunctivae normal.  Neck:     Thyroid : No thyromegaly.  Cardiovascular:     Rate and Rhythm: Normal rate and regular rhythm.     Heart sounds: Normal heart sounds. No murmur heard. Pulmonary:     Effort: Pulmonary effort is normal. No respiratory distress.     Breath sounds: Normal breath sounds. No wheezing.  Musculoskeletal:        General:  Swelling (Trace swelling) present. Normal range of motion.     Cervical back: Neck supple.  Lymphadenopathy:     Cervical: No cervical adenopathy.  Skin:    General: Skin is warm and dry.     Findings: No rash.  Neurological:     Mental Status: He is alert and oriented to person, place, and time.     Coordination: Coordination normal.  Psychiatric:        Behavior: Behavior normal.    Physical Exam   VITALS: BP- 130/85 CHEST: Lungs clear to auscultation bilaterally. CARDIOVASCULAR: Heart regular rate and rhythm, no murmurs. EXTREMITIES: Mild leg swelling, good pulses.         Assessment &  Plan:   Problem List Items Addressed This Visit       Cardiovascular and Mediastinum   Hypertension associated with diabetes (HCC)     Endocrine   Hyperlipidemia associated with type 2 diabetes mellitus (HCC)   Type 2 diabetes mellitus with diabetic chronic kidney disease (HCC) - Primary   Relevant Orders   CBC with Differential/Platelet   CMP14+EGFR   Lipid panel   Bayer DCA Hb A1c Waived     Genitourinary   Chronic kidney disease (CKD), stage III (moderate) (HCC)   Relevant Orders   CBC with Differential/Platelet   CMP14+EGFR   Lipid panel   Bayer DCA Hb A1c Waived      Type 2 diabetes mellitus with stage 3a chronic kidney disease Diabetes well-managed, stable hemoglobin A1c. - Continue current diabetes medications, including Januvia .  Status post prostate cancer treated with radiation seeds Prostate cancer treated with radiation seeds, healed well, satisfactory CT and MRI. Patient satisfied with treatment outcome.  Lower extremity edema Mild edema with good pulses, no acute issues.          Follow up plan: Return in about 3 months (around 12/12/2023), or if symptoms worsen or fail to improve, for Diabetes and hypertension and cholesterol recheck.  Counseling provided for all of the vaccine components Orders Placed This Encounter  Procedures   CBC with  Differential/Platelet   CMP14+EGFR   Lipid panel   Bayer DCA Hb A1c Waived    Fonda Levins, MD [facility] 09/12/2023, 12:14 PM

## 2023-09-13 LAB — CBC WITH DIFFERENTIAL/PLATELET
Basophils Absolute: 0 x10E3/uL (ref 0.0–0.2)
Basos: 0 %
EOS (ABSOLUTE): 0.2 x10E3/uL (ref 0.0–0.4)
Eos: 3 %
Hematocrit: 39.2 % (ref 37.5–51.0)
Hemoglobin: 12.8 g/dL — ABNORMAL LOW (ref 13.0–17.7)
Immature Grans (Abs): 0 x10E3/uL (ref 0.0–0.1)
Immature Granulocytes: 0 %
Lymphocytes Absolute: 1.9 x10E3/uL (ref 0.7–3.1)
Lymphs: 28 %
MCH: 29 pg (ref 26.6–33.0)
MCHC: 32.7 g/dL (ref 31.5–35.7)
MCV: 89 fL (ref 79–97)
Monocytes Absolute: 0.7 x10E3/uL (ref 0.1–0.9)
Monocytes: 9 %
Neutrophils Absolute: 4.2 x10E3/uL (ref 1.4–7.0)
Neutrophils: 60 %
Platelets: 216 x10E3/uL (ref 150–450)
RBC: 4.41 x10E6/uL (ref 4.14–5.80)
RDW: 14.2 % (ref 11.6–15.4)
WBC: 7 x10E3/uL (ref 3.4–10.8)

## 2023-09-13 LAB — CMP14+EGFR
ALT: 22 IU/L (ref 0–44)
AST: 30 IU/L (ref 0–40)
Albumin: 4 g/dL (ref 3.8–4.8)
Alkaline Phosphatase: 93 IU/L (ref 44–121)
BUN/Creatinine Ratio: 6 — AB (ref 10–24)
BUN: 9 mg/dL (ref 8–27)
Bilirubin Total: 1.4 mg/dL — AB (ref 0.0–1.2)
CO2: 22 mmol/L (ref 20–29)
Calcium: 9.5 mg/dL (ref 8.6–10.2)
Chloride: 106 mmol/L (ref 96–106)
Creatinine, Ser: 1.41 mg/dL — AB (ref 0.76–1.27)
Globulin, Total: 2.5 g/dL (ref 1.5–4.5)
Glucose: 106 mg/dL — AB (ref 70–99)
Potassium: 4.3 mmol/L (ref 3.5–5.2)
Sodium: 141 mmol/L (ref 134–144)
Total Protein: 6.5 g/dL (ref 6.0–8.5)
eGFR: 53 mL/min/1.73 — AB (ref >59)

## 2023-09-13 LAB — LIPID PANEL
Cholesterol, Total: 105 mg/dL (ref 100–199)
HDL: 57 mg/dL (ref >39)
LDL CALC COMMENT:: 1.8 ratio (ref 0.0–5.0)
LDL Chol Calc (NIH): 33 mg/dL (ref 0–99)
Triglycerides: 69 mg/dL (ref 0–149)
VLDL Cholesterol Cal: 15 mg/dL (ref 5–40)

## 2023-09-16 ENCOUNTER — Encounter: Payer: Self-pay | Admitting: *Deleted

## 2023-09-19 ENCOUNTER — Ambulatory Visit: Payer: Self-pay | Admitting: Family Medicine

## 2023-10-25 ENCOUNTER — Inpatient Hospital Stay: Attending: Internal Medicine | Admitting: *Deleted

## 2023-10-25 ENCOUNTER — Encounter: Payer: Self-pay | Admitting: *Deleted

## 2023-10-25 DIAGNOSIS — C61 Malignant neoplasm of prostate: Secondary | ICD-10-CM

## 2023-10-25 NOTE — Progress Notes (Signed)
 SCP reviewed and completed. A copy of prostate summary has been sent to PCP.

## 2023-11-03 ENCOUNTER — Other Ambulatory Visit

## 2023-11-03 DIAGNOSIS — C61 Malignant neoplasm of prostate: Secondary | ICD-10-CM | POA: Diagnosis not present

## 2023-11-04 LAB — PSA: Prostate Specific Ag, Serum: 2.3 ng/mL (ref 0.0–4.0)

## 2023-11-08 ENCOUNTER — Ambulatory Visit: Payer: Self-pay | Admitting: Urology

## 2023-11-18 ENCOUNTER — Ambulatory Visit: Admitting: Urology

## 2023-11-18 ENCOUNTER — Encounter: Payer: Self-pay | Admitting: Urology

## 2023-11-18 VITALS — BP 142/88 | HR 59

## 2023-11-18 DIAGNOSIS — N433 Hydrocele, unspecified: Secondary | ICD-10-CM

## 2023-11-18 DIAGNOSIS — C61 Malignant neoplasm of prostate: Secondary | ICD-10-CM

## 2023-11-18 DIAGNOSIS — N432 Other hydrocele: Secondary | ICD-10-CM

## 2023-11-18 LAB — URINALYSIS, ROUTINE W REFLEX MICROSCOPIC
Bilirubin, UA: NEGATIVE
Glucose, UA: NEGATIVE
Leukocytes,UA: NEGATIVE
Nitrite, UA: NEGATIVE
RBC, UA: NEGATIVE
Specific Gravity, UA: 1.02 (ref 1.005–1.030)
Urobilinogen, Ur: 1 mg/dL (ref 0.2–1.0)
pH, UA: 6.5 (ref 5.0–7.5)

## 2023-11-18 NOTE — Progress Notes (Signed)
 11/18/2023 11:09 AM   Paul Williams 07/18/1950 [id number]  Referring provider: Dettinger, Fonda LABOR, MD [address] [place],  [place] [address]  Followup prostate cancer   HPI: Paul Williams is a 73yo here for followup for prostate cancer. PSA decreased to 2.3 from 6.5. He had brachytherapy 07/2023. IPSS 9 QOL 2 on no BPH therapy. Urine stream strong. Nocturia 0-1x. He has occasional urinary urgency but no incontinence. 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   PMH: Past Medical History:  Diagnosis Date   Adenomatous polyp of colon    CKD (chronic kidney disease)    Diabetes mellitus without complication (HCC)    Diverticulosis    Elevated PSA    Hyperlipidemia    Hypertension    Internal hemorrhoids    Prostate cancer (HCC) 05/2023   Sleep apnea     Surgical History: Past Surgical History:  Procedure Laterality Date   COLONOSCOPY     POLYPECTOMY     PROSTATE BIOPSY     RADIOACTIVE SEED IMPLANT N/A 07/28/2023   Procedure: INSERTION, RADIATION SOURCE, PROSTATE, SPACEOAR;  Surgeon: [name] CROME, MD;  Location: [facility];  Service: Urology;  Laterality: N/A;   top plate of teeth extracted      Home Medications:  Allergies as of 11/18/2023   No Known Allergies      Medication List        Accurate as of November 18, 2023 11:09 AM. If you have any questions, ask your nurse or doctor.          allopurinol  100 MG tablet Commonly known as: ZYLOPRIM  Take 0.5 tablets (50 mg total) by mouth daily.   aspirin EC 81 MG tablet Take 81 mg by mouth in the morning.   atorvastatin  40 MG tablet Commonly known as: LIPITOR Take 1 tablet (40 mg total) by mouth daily.   DropSafe Alcohol Prep 70 % Pads Test BS BID Dx E11.22   fish oil-omega-3 fatty acids 1000 MG capsule Take 1 g by mouth in the morning.   K-Phos 500 MG tablet Generic drug: potassium phosphate (monobasic) Take 500 mg by mouth 2 (two) times daily.   labetalol  200 MG tablet Commonly known as: NORMODYNE Take 200 mg by mouth 2 (two) times daily.   Mitigare  0.6 MG Caps Generic drug: Colchicine  2 tablets at pain onset, may repeat x1in 1 hour. No more then 3 tablets in 24 hours What changed:  how much to take how to take this when to take this reasons to take this additional instructions   multivitamin tablet Take 1 tablet by mouth in the morning.   sitaGLIPtin  100 MG tablet Commonly known as: Januvia  Take 1 tablet (100 mg total) by mouth daily.   SUPERIOR PROSTATE PO Take 1 capsule by mouth daily. Super Beta Prostate   telmisartan 20 MG tablet Commonly known as: MICARDIS Take 20 mg by mouth in the morning.   True Metrix Air Glucose Meter w/Device Kit Test BS BID Dx E11.22   True Metrix Blood Glucose Test test strip Generic drug: glucose blood Test BS BID Dx E11.22   True Metrix Level 1 Low Soln Use w/ glucose monitor Dx E11.22   TRUEplus Lancets 33G Misc Test BS BID Dx E11.22        Allergies: No Known Allergies  Family History: Family History  Problem Relation Age of Onset   Stroke Mother    Diabetes Father    Prostate cancer Father  prostate   Diabetes Sister    Diabetes Sister    Colon cancer Neg Hx    Esophageal cancer Neg Hx    Rectal cancer Neg Hx    Stomach cancer Neg Hx    Colon polyps Neg Hx     Social History:  reports that he has never smoked. He has never used smokeless tobacco. He reports that he does not drink alcohol and does not use drugs.  ROS: All other review of systems were reviewed and are negative except what is noted above in HPI  Physical Exam: BP (!) 142/88   Pulse (!) 59   Constitutional:  Alert and oriented, No acute distress. HEENT: [place] AT, moist mucus membranes.  Trachea midline, no masses. Cardiovascular: No clubbing, cyanosis, or edema. Respiratory: Normal respiratory effort, no increased work of breathing. GI: Abdomen is soft, nontender, nondistended, no abdominal  masses GU: No CVA tenderness.  Lymph: No cervical or inguinal lymphadenopathy. Skin: No rashes, bruises or suspicious lesions. Neurologic: Grossly intact, no focal deficits, moving all 4 extremities. Psychiatric: Normal mood and affect.  Laboratory Data: Lab Results  Component Value Date   WBC 7.0 09/12/2023   HGB 12.8 (L) 09/12/2023   HCT 39.2 09/12/2023   MCV 89 09/12/2023   PLT 216 09/12/2023    Lab Results  Component Value Date   CREATININE 1.41 (H) 09/12/2023    Lab Results  Component Value Date   PSA 2.0 03/28/2014   PSA 2.0 06/15/2013    No results found for: TESTOSTERONE  Lab Results  Component Value Date   HGBA1C 6.5 (H) 09/12/2023    Urinalysis    Component Value Date/Time   APPEARANCEUR Clear 02/25/2023 0901   GLUCOSEU Negative 02/25/2023 0901   BILIRUBINUR Negative 02/25/2023 0901   PROTEINUR Trace 02/25/2023 0901   NITRITE Negative 02/25/2023 0901   LEUKOCYTESUR Negative 02/25/2023 0901    Lab Results  Component Value Date   LABMICR 9.1 05/16/2023    Pertinent Imaging:  No results found for this or any previous visit.  No results found for this or any previous visit.  No results found for this or any previous visit.  No results found for this or any previous visit.  Results for orders placed during the hospital encounter of 01/18/17  US  RENAL  Narrative CLINICAL DATA:  73 year old hypertensive diabetic male with chronic kidney disease stage 3. Initial encounter.  EXAM: RENAL / URINARY TRACT ULTRASOUND COMPLETE  COMPARISON:  05/19/2016 CT.  FINDINGS: Right Kidney:  Length: 10.8 cm. Echogenicity within normal limits. No hydronephrosis. Lower pole 2.6 x 2.8 x 2.7 cm cyst.  Left Kidney:  Length: 11 cm. Echogenicity within normal limits. No mass or hydronephrosis visualized.  Bladder:  Under distended. Circumferential wall thickening may be related to under distension.  IMPRESSION: No hydronephrosis.  Right lower  pole 2.8 cm cyst.  Under distended urinary bladder.   Electronically Signed By: Elspeth Slain M.D. On: 01/18/2017 19:34  No results found for this or any previous visit.  No results found for this or any previous visit.  No results found for this or any previous visit.   Assessment & Plan:    1. Prostate cancer (HCC) (Primary) Followup 3 months with a PSA - Urinalysis, Routine w reflex microscopic  2. Bilateral hydroceles -the risks/benefits/alternatives to bilateral hydrocelectomy was explained tyo the patient and he understands and wishes to proceed with surgery   No follow-ups on file.  Belvie Clara, MD  [facility] [place]

## 2023-11-18 NOTE — Patient Instructions (Signed)
 Surgery to Remove Fluid Build-Up in the Scrotum (Hydrocelectomy) in Adults: What to Expect  Hydrocelectomy is surgery to remove a build-up of fluid in the pouch that holds the testicles (scrotum). This build-up is called a hydrocele. You may need a hydrocelectomy if the hydrocele: Is causing pain. Is getting bigger. Tell a health care provider about: Any allergies you have. All medicines you take. These include vitamins, herbs, eye drops, and creams. Any problems you or family members have had with anesthesia. Any bleeding problems you have. Any surgeries you've had. Any medical problems you have. What are the risks? Your health care provider will talk with you about risks. These may include: Bleeding. Infection. Damage to nearby structures or organs. Allergies to medicines. A second surgery. What happens before? When to stop eating and drinking Eat and drink as you've been told. If you don't, your procedure to remove fluid in your scrotum may be delayed or canceled. You may be told this: 8 hours before Do not eat meat, fried foods, or fatty foods. Eat only light foods, such as toast or crackers. All liquids are OK except energy drinks and alcohol. 6 hours before Stop eating. Drink only clear liquids, such as water , clear fruit juice, black coffee, plain tea, and sports drinks. Do not drink energy drinks or alcohol. 2 hours before Stop drinking all liquids. You may be allowed to take medicines with small sips of water . Medicines Ask about changing or stopping: Any medicines you take. Any vitamins, herbs, or supplements you take. Do not take aspirin or ibuprofen unless you're told to. Surgery safety For your safety, you may: Need to wash your skin with a soap that kills germs. Get antibiotics. Have your surgery site marked. Have hair removed at the surgery site. General instructions Do not smoke, vape, or use nicotine or tobacco for at least 4 weeks before the  surgery. Ask if you'll be staying overnight in the hospital. If you'll be going home right after the surgery, plan to have a responsible adult: Drive you home from the hospital or clinic. You won't be allowed to drive. Stay with you for the time you're told. What happens during the hydrocelectomy? An IV will be put into a vein in your hand or arm. You'll be given: A sedative to help you relax. Anesthesia to keep you from feeling pain. A small cut will be made in the groin area or in the scrotum. Another small cut will be made in the sac where the fluid has built up. The fluid will be drained and the hydrocele sac will be removed. The hydrocele will be closed with stitches. If your hydrocele is large, you may have a thin, rubber drain placed in the cut area to keep draining fluid after surgery. The cut in your scrotum or groin will be closed with stitches, skin glue, or tape strips. A bandage will be placed over the cut. You may have an athletic support strap placed. This is also called scrotal support. These steps may vary. Ask what you can expect. What happens after? You'll be watched closely until you leave. This includes checking your pain level, blood pressure, heart rate, and breathing rate. You'll be given pain medicine as needed. This information is not intended to replace advice given to you by your health care provider. Make sure you discuss any questions you have with your health care provider. Document Revised: 04/04/2023 Document Reviewed: 04/04/2023 Elsevier Patient Education  2025 [employer].

## 2023-11-29 ENCOUNTER — Encounter: Payer: Self-pay | Admitting: *Deleted

## 2023-11-29 NOTE — Progress Notes (Signed)
 SCP reviewed and completed. Summary sent to pt and provider.Most recent PSA  was 2.3 on 10/30.

## 2023-12-05 DIAGNOSIS — I10 Essential (primary) hypertension: Secondary | ICD-10-CM | POA: Diagnosis not present

## 2023-12-05 DIAGNOSIS — Z809 Family history of malignant neoplasm, unspecified: Secondary | ICD-10-CM | POA: Diagnosis not present

## 2023-12-05 DIAGNOSIS — Z7984 Long term (current) use of oral hypoglycemic drugs: Secondary | ICD-10-CM | POA: Diagnosis not present

## 2023-12-05 DIAGNOSIS — R32 Unspecified urinary incontinence: Secondary | ICD-10-CM | POA: Diagnosis not present

## 2023-12-05 DIAGNOSIS — M109 Gout, unspecified: Secondary | ICD-10-CM | POA: Diagnosis not present

## 2023-12-05 DIAGNOSIS — Z7982 Long term (current) use of aspirin: Secondary | ICD-10-CM | POA: Diagnosis not present

## 2023-12-05 DIAGNOSIS — E785 Hyperlipidemia, unspecified: Secondary | ICD-10-CM | POA: Diagnosis not present

## 2023-12-05 DIAGNOSIS — E119 Type 2 diabetes mellitus without complications: Secondary | ICD-10-CM | POA: Diagnosis not present

## 2023-12-07 DIAGNOSIS — E119 Type 2 diabetes mellitus without complications: Secondary | ICD-10-CM | POA: Diagnosis not present

## 2023-12-07 DIAGNOSIS — R809 Proteinuria, unspecified: Secondary | ICD-10-CM | POA: Diagnosis not present

## 2023-12-07 DIAGNOSIS — D631 Anemia in chronic kidney disease: Secondary | ICD-10-CM | POA: Diagnosis not present

## 2023-12-07 DIAGNOSIS — I10 Essential (primary) hypertension: Secondary | ICD-10-CM | POA: Diagnosis not present

## 2023-12-07 DIAGNOSIS — N189 Chronic kidney disease, unspecified: Secondary | ICD-10-CM | POA: Diagnosis not present

## 2023-12-07 DIAGNOSIS — E211 Secondary hyperparathyroidism, not elsewhere classified: Secondary | ICD-10-CM | POA: Diagnosis not present

## 2023-12-07 DIAGNOSIS — E559 Vitamin D deficiency, unspecified: Secondary | ICD-10-CM | POA: Diagnosis not present

## 2023-12-21 ENCOUNTER — Ambulatory Visit: Payer: Self-pay | Admitting: Family Medicine

## 2023-12-21 ENCOUNTER — Encounter: Payer: Self-pay | Admitting: Family Medicine

## 2023-12-21 VITALS — BP 160/91 | HR 73 | Ht 65.0 in | Wt 236.0 lb

## 2023-12-21 DIAGNOSIS — I152 Hypertension secondary to endocrine disorders: Secondary | ICD-10-CM | POA: Diagnosis not present

## 2023-12-21 DIAGNOSIS — E1122 Type 2 diabetes mellitus with diabetic chronic kidney disease: Secondary | ICD-10-CM | POA: Diagnosis not present

## 2023-12-21 DIAGNOSIS — E1169 Type 2 diabetes mellitus with other specified complication: Secondary | ICD-10-CM

## 2023-12-21 DIAGNOSIS — N1831 Chronic kidney disease, stage 3a: Secondary | ICD-10-CM

## 2023-12-21 DIAGNOSIS — Z7984 Long term (current) use of oral hypoglycemic drugs: Secondary | ICD-10-CM

## 2023-12-21 DIAGNOSIS — N1832 Chronic kidney disease, stage 3b: Secondary | ICD-10-CM

## 2023-12-21 DIAGNOSIS — E1159 Type 2 diabetes mellitus with other circulatory complications: Secondary | ICD-10-CM

## 2023-12-21 DIAGNOSIS — E785 Hyperlipidemia, unspecified: Secondary | ICD-10-CM

## 2023-12-21 LAB — CMP14+EGFR
ALT: 23 IU/L (ref 0–44)
AST: 38 IU/L (ref 0–40)
Albumin: 4.3 g/dL (ref 3.8–4.8)
Alkaline Phosphatase: 90 IU/L (ref 47–123)
BUN/Creatinine Ratio: 7 — ABNORMAL LOW (ref 10–24)
BUN: 10 mg/dL (ref 8–27)
Bilirubin Total: 1 mg/dL (ref 0.0–1.2)
CO2: 24 mmol/L (ref 20–29)
Calcium: 9.7 mg/dL (ref 8.6–10.2)
Chloride: 107 mmol/L — ABNORMAL HIGH (ref 96–106)
Creatinine, Ser: 1.37 mg/dL — ABNORMAL HIGH (ref 0.76–1.27)
Globulin, Total: 2.5 g/dL (ref 1.5–4.5)
Glucose: 128 mg/dL — ABNORMAL HIGH (ref 70–99)
Potassium: 4.4 mmol/L (ref 3.5–5.2)
Sodium: 142 mmol/L (ref 134–144)
Total Protein: 6.8 g/dL (ref 6.0–8.5)
eGFR: 55 mL/min/1.73 — ABNORMAL LOW (ref >59)

## 2023-12-21 LAB — CBC WITH DIFFERENTIAL/PLATELET
Basophils Absolute: 0 x10E3/uL (ref 0.0–0.2)
Basos: 1 %
EOS (ABSOLUTE): 0.2 x10E3/uL (ref 0.0–0.4)
Eos: 3 %
Hematocrit: 39.6 % (ref 37.5–51.0)
Hemoglobin: 12.7 g/dL — ABNORMAL LOW (ref 13.0–17.7)
Immature Grans (Abs): 0 x10E3/uL (ref 0.0–0.1)
Immature Granulocytes: 0 %
Lymphocytes Absolute: 1.6 x10E3/uL (ref 0.7–3.1)
Lymphs: 25 %
MCH: 28.7 pg (ref 26.6–33.0)
MCHC: 32.1 g/dL (ref 31.5–35.7)
MCV: 89 fL (ref 79–97)
Monocytes Absolute: 0.6 x10E3/uL (ref 0.1–0.9)
Monocytes: 10 %
Neutrophils Absolute: 3.7 x10E3/uL (ref 1.4–7.0)
Neutrophils: 61 %
Platelets: 223 x10E3/uL (ref 150–450)
RBC: 4.43 x10E6/uL (ref 4.14–5.80)
RDW: 13.7 % (ref 11.6–15.4)
WBC: 6.1 x10E3/uL (ref 3.4–10.8)

## 2023-12-21 LAB — LIPID PANEL
Chol/HDL Ratio: 1.8 ratio (ref 0.0–5.0)
Cholesterol, Total: 94 mg/dL — ABNORMAL LOW (ref 100–199)
HDL: 52 mg/dL (ref >39)
LDL Chol Calc (NIH): 29 mg/dL (ref 0–99)
Triglycerides: 55 mg/dL (ref 0–149)
VLDL Cholesterol Cal: 13 mg/dL (ref 5–40)

## 2023-12-21 LAB — BAYER DCA HB A1C WAIVED: HB A1C (BAYER DCA - WAIVED): 7 % — ABNORMAL HIGH (ref 4.8–5.6)

## 2023-12-21 LAB — TSH: TSH: 1.55 u[IU]/mL (ref 0.450–4.500)

## 2023-12-21 NOTE — Progress Notes (Signed)
 BP (!) 160/91   Pulse 73   Ht 5' 5 (1.651 m)   Wt 236 lb (107 kg)   SpO2 98%   BMI 39.27 kg/m    Subjective:   Patient ID: Paul Williams, male    DOB: 1950-05-13, 73 y.o.   MRN: [id number]  HPI: Paul Williams is a 73 y.o. male presenting on 12/21/2023 for Medical Management of Chronic Issues, Diabetes, Hyperlipidemia, and Chronic Kidney Disease   Discussed the use of AI scribe software for clinical note transcription with the patient, who gave verbal consent to proceed.  History of Present Illness   Paul Williams is a 73 year old male with hypertension and diabetes who presents for routine follow-up.  Blood pressure management - Monitors blood pressure at home twice daily - Recent home readings: 124/76 mmHg and 126/74 mmHg - Higher blood pressure readings in the office, likely due to white coat syndrome - Receives home visits from a healthcare provider through insurance  Glycemic control and diabetes management - A1c slightly elevated at 7.10% - Attributes elevated A1c to recent holiday dietary indulgences - Vigilant about foot care and aware of risk for complications such as amputation, which occurred in his father - Consistently wears appropriate footwear, including at home, to protect feet  Renal and prostate health surveillance - Under care of a kidney specialist - Kidney specialist recommended genetic testing due to concerns about prostate disease - Committed to following through with recommended genetic testing          Relevant past medical, surgical, family and social history reviewed and updated as indicated. Interim medical history since our last visit reviewed. Allergies and medications reviewed and updated.  Review of Systems  Constitutional:  Negative for chills and fever.  Eyes:  Negative for visual disturbance.  Respiratory:  Negative for shortness of breath and wheezing.   Cardiovascular:  Negative for chest pain and leg swelling.  Musculoskeletal:   Negative for back pain and gait problem.  Skin:  Negative for rash.  Neurological:  Negative for dizziness, light-headedness and headaches.  All other systems reviewed and are negative.   Per HPI unless specifically indicated above   Allergies as of 12/21/2023   No Known Allergies      Medication List        Accurate as of December 21, 2023 10:18 AM. If you have any questions, ask your nurse or doctor.          allopurinol  100 MG tablet Commonly known as: ZYLOPRIM  Take 0.5 tablets (50 mg total) by mouth daily.   aspirin EC 81 MG tablet Take 81 mg by mouth in the morning.   atorvastatin  40 MG tablet Commonly known as: LIPITOR Take 1 tablet (40 mg total) by mouth daily.   DropSafe Alcohol Prep 70 % Pads Test BS BID Dx E11.22   fish oil-omega-3 fatty acids 1000 MG capsule Take 1 g by mouth in the morning.   K-Phos 500 MG tablet Generic drug: potassium phosphate (monobasic) Take 500 mg by mouth 2 (two) times daily.   labetalol 200 MG tablet Commonly known as: NORMODYNE Take 200 mg by mouth 2 (two) times daily.   Mitigare  0.6 MG Caps Generic drug: Colchicine  2 tablets at pain onset, may repeat x1in 1 hour. No more then 3 tablets in 24 hours What changed:  how much to take how to take this when to take this reasons to take this additional instructions   multivitamin tablet Take 1 tablet by mouth  in the morning.   sitaGLIPtin  100 MG tablet Commonly known as: Januvia  Take 1 tablet (100 mg total) by mouth daily.   SUPERIOR PROSTATE PO Take 1 capsule by mouth daily. Super Beta Prostate   telmisartan 20 MG tablet Commonly known as: MICARDIS Take 20 mg by mouth in the morning.   True Metrix Air Glucose Meter w/Device Kit Test BS BID Dx E11.22   True Metrix Blood Glucose Test test strip Generic drug: glucose blood Test BS BID Dx E11.22   True Metrix Level 1 Low Soln Use w/ glucose monitor Dx E11.22   TRUEplus Lancets 33G Misc Test BS BID Dx  E11.22         Objective:   BP (!) 160/91   Pulse 73   Ht 5' 5 (1.651 m)   Wt 236 lb (107 kg)   SpO2 98%   BMI 39.27 kg/m   Wt Readings from Last 3 Encounters:  12/21/23 236 lb (107 kg)  09/12/23 230 lb (104.3 kg)  08/18/23 224 lb 12.8 oz (102 kg)    Physical Exam Vitals and nursing note reviewed.  Constitutional:      General: He is not in acute distress.    Appearance: He is well-developed. He is not diaphoretic.  Eyes:     General: No scleral icterus.    Conjunctiva/sclera: Conjunctivae normal.  Neck:     Thyroid : No thyromegaly.  Cardiovascular:     Rate and Rhythm: Normal rate and regular rhythm.     Heart sounds: Normal heart sounds. No murmur heard. Pulmonary:     Effort: Pulmonary effort is normal. No respiratory distress.     Breath sounds: Normal breath sounds. No wheezing.  Musculoskeletal:        General: No swelling. Normal range of motion.     Cervical back: Neck supple.  Lymphadenopathy:     Cervical: No cervical adenopathy.  Skin:    General: Skin is warm and dry.     Findings: No rash.  Neurological:     Mental Status: He is alert and oriented to person, place, and time.     Coordination: Coordination normal.  Psychiatric:        Behavior: Behavior normal.       NECK: Thyroid  normal, no masses. CHEST: Lungs clear to auscultation. CARDIOVASCULAR: Heart regular rate and rhythm. EXTREMITIES: Feet normal.         Assessment & Plan:   Problem List Items Addressed This Visit       Cardiovascular and Mediastinum   Hypertension associated with diabetes (HCC)   Relevant Orders   CBC with Differential/Platelet   CMP14+EGFR   Lipid panel   TSH   Bayer DCA Hb A1c Waived     Endocrine   Hyperlipidemia associated with type 2 diabetes mellitus (HCC)   Relevant Orders   CBC with Differential/Platelet   CMP14+EGFR   Lipid panel   TSH   Bayer DCA Hb A1c Waived   Type 2 diabetes mellitus with diabetic chronic kidney disease (HCC) -  Primary   Relevant Orders   CBC with Differential/Platelet   CMP14+EGFR   Lipid panel   TSH   Bayer DCA Hb A1c Waived     Genitourinary   Chronic kidney disease (CKD), stage III (moderate) (HCC)     Other   Morbid obesity (HCC)       Type 2 diabetes mellitus with stage 3 chronic kidney disease A1c slightly elevated at 7.10, likely due to recent dietary choices.  He was informed about the importance of monitoring blood glucose levels. - Continue to monitor blood glucose levels regularly.  Hypertension associated with diabetes Home blood pressure readings normal. Office readings may be elevated due to white coat syndrome. He was advised to continue home monitoring. - Continue home blood pressure monitoring twice daily. - Report any significant changes in blood pressure readings.  Chronic kidney disease, stage 3a Under nephrologist care with planned genetic testing for further evaluation. He agreed to proceed with recommended tests. - Proceed with genetic testing as advised by nephrologist. - Ensure nephrologist sends results to primary care provider.          Follow up plan: Return in about 3 months (around 03/20/2024), or if symptoms worsen or fail to improve, for Diabetes recheck.  Counseling provided for all of the vaccine components Orders Placed This Encounter  Procedures   CBC with Differential/Platelet   CMP14+EGFR   Lipid panel   TSH   Bayer DCA Hb A1c Waived    Fonda Levins, MD [facility] 12/21/2023, 10:18 AM

## 2024-01-17 ENCOUNTER — Other Ambulatory Visit: Payer: Self-pay

## 2024-01-17 ENCOUNTER — Encounter (HOSPITAL_COMMUNITY)
Admission: RE | Admit: 2024-01-17 | Discharge: 2024-01-17 | Disposition: A | Discharge status: Home / Self Care | Source: Ambulatory Visit | Attending: Urology | Admitting: Urology

## 2024-01-17 ENCOUNTER — Encounter (HOSPITAL_COMMUNITY): Payer: Self-pay

## 2024-01-19 ENCOUNTER — Ambulatory Visit (HOSPITAL_COMMUNITY): Admitting: Certified Registered"

## 2024-01-19 ENCOUNTER — Encounter (HOSPITAL_COMMUNITY): Admission: RE | Disposition: A | Payer: Self-pay | Source: Home / Self Care | Attending: Urology

## 2024-01-19 ENCOUNTER — Ambulatory Visit (HOSPITAL_COMMUNITY)
Admission: RE | Admit: 2024-01-19 | Discharge: 2024-01-19 | Disposition: A | Discharge status: Home / Self Care | Attending: Urology | Admitting: Urology

## 2024-01-19 ENCOUNTER — Encounter (HOSPITAL_COMMUNITY): Payer: Self-pay | Admitting: Urology

## 2024-01-19 DIAGNOSIS — Z7984 Long term (current) use of oral hypoglycemic drugs: Secondary | ICD-10-CM | POA: Insufficient documentation

## 2024-01-19 DIAGNOSIS — E1122 Type 2 diabetes mellitus with diabetic chronic kidney disease: Secondary | ICD-10-CM | POA: Diagnosis not present

## 2024-01-19 DIAGNOSIS — I129 Hypertensive chronic kidney disease with stage 1 through stage 4 chronic kidney disease, or unspecified chronic kidney disease: Secondary | ICD-10-CM | POA: Insufficient documentation

## 2024-01-19 DIAGNOSIS — N433 Hydrocele, unspecified: Secondary | ICD-10-CM | POA: Diagnosis not present

## 2024-01-19 DIAGNOSIS — N183 Chronic kidney disease, stage 3 unspecified: Secondary | ICD-10-CM | POA: Diagnosis not present

## 2024-01-19 DIAGNOSIS — G4730 Sleep apnea, unspecified: Secondary | ICD-10-CM | POA: Insufficient documentation

## 2024-01-19 DIAGNOSIS — C61 Malignant neoplasm of prostate: Secondary | ICD-10-CM | POA: Diagnosis not present

## 2024-01-19 DIAGNOSIS — N432 Other hydrocele: Secondary | ICD-10-CM

## 2024-01-19 HISTORY — PX: HYDROCELE EXCISION: SHX482

## 2024-01-19 LAB — GLUCOSE, CAPILLARY
Glucose-Capillary: 140 mg/dL — ABNORMAL HIGH (ref 70–99)
Glucose-Capillary: 137 mg/dL — ABNORMAL HIGH (ref 70–99)

## 2024-01-19 MED ORDER — 0.9 % SODIUM CHLORIDE (POUR BTL) OPTIME
TOPICAL | Status: DC | PRN
  Administered 2024-01-19: 1000 mL

## 2024-01-19 MED ORDER — FENTANYL CITRATE (PF) 100 MCG/2ML IJ SOLN
INTRAMUSCULAR | Status: AC
  Filled 2024-01-19: qty 2

## 2024-01-19 MED ORDER — OXYCODONE HCL 5 MG PO TABS: 5.0000 mg | ORAL_TABLET | Freq: Once | ORAL | Status: AC | PRN

## 2024-01-19 MED ORDER — DEXMEDETOMIDINE HCL IN NACL 80 MCG/20ML IV SOLN
INTRAVENOUS | Status: DC | PRN
  Administered 2024-01-19: 12 ug via INTRAVENOUS

## 2024-01-19 MED ORDER — LIDOCAINE 2% (20 MG/ML) 5 ML SYRINGE
INTRAMUSCULAR | Status: AC
  Filled 2024-01-19: qty 5

## 2024-01-19 MED ORDER — LIDOCAINE HCL (CARDIAC) PF 100 MG/5ML IV SOSY
PREFILLED_SYRINGE | INTRAVENOUS | Status: DC | PRN
  Administered 2024-01-19: 100 mg via INTRAVENOUS

## 2024-01-19 MED ORDER — CEFAZOLIN SODIUM-DEXTROSE 2-4 GM/100ML-% IV SOLN
INTRAVENOUS | Status: AC
  Filled 2024-01-19: qty 100

## 2024-01-19 MED ORDER — OXYCODONE-ACETAMINOPHEN 5-325 MG PO TABS: 1.0000 | ORAL_TABLET | ORAL | 0 refills | Status: AC | PRN

## 2024-01-19 MED ORDER — MIDAZOLAM HCL 2 MG/2ML IJ SOLN
INTRAMUSCULAR | Status: AC
  Filled 2024-01-19: qty 2

## 2024-01-19 MED ORDER — OXYCODONE HCL 5 MG/5ML PO SOLN
5.0000 mg | Freq: Once | ORAL | Status: AC | PRN
  Administered 2024-01-19: 5 mg via ORAL
  Filled 2024-01-19: qty 5

## 2024-01-19 MED ORDER — CHLORHEXIDINE GLUCONATE 0.12 % MT SOLN: 15.0000 mL | Freq: Once | OROMUCOSAL | Status: DC

## 2024-01-19 MED ORDER — DEXAMETHASONE SOD PHOSPHATE PF 10 MG/ML IJ SOLN
INTRAMUSCULAR | Status: DC | PRN
  Administered 2024-01-19: 4 mg via INTRAVENOUS

## 2024-01-19 MED ORDER — ONDANSETRON HCL 4 MG/2ML IJ SOLN: 4.0000 mg | Freq: Once | INTRAMUSCULAR | Status: DC | PRN

## 2024-01-19 MED ORDER — ONDANSETRON HCL 4 MG/2ML IJ SOLN
INTRAMUSCULAR | Status: DC | PRN
  Administered 2024-01-19: 4 mg via INTRAVENOUS

## 2024-01-19 MED ORDER — FENTANYL CITRATE (PF) 250 MCG/5ML IJ SOLN
INTRAMUSCULAR | Status: DC | PRN
  Administered 2024-01-19 (×4): 25 ug via INTRAVENOUS

## 2024-01-19 MED ORDER — ORAL CARE MOUTH RINSE: 15.0000 mL | Freq: Once | OROMUCOSAL | Status: DC

## 2024-01-19 MED ORDER — BUPIVACAINE HCL (PF) 0.25 % IJ SOLN
INTRAMUSCULAR | Status: AC
  Filled 2024-01-19: qty 30

## 2024-01-19 MED ORDER — LACTATED RINGERS IV SOLN: INTRAVENOUS | Status: DC

## 2024-01-19 MED ORDER — BUPIVACAINE HCL (PF) 0.25 % IJ SOLN
INTRAMUSCULAR | Status: DC | PRN
  Administered 2024-01-19: 10 mL

## 2024-01-19 MED ORDER — PHENYLEPHRINE HCL (PRESSORS) 10 MG/ML IV SOLN
INTRAVENOUS | Status: DC | PRN
  Administered 2024-01-19 (×2): 100 ug via INTRAVENOUS

## 2024-01-19 MED ORDER — ONDANSETRON HCL 4 MG/2ML IJ SOLN
INTRAMUSCULAR | Status: AC
  Filled 2024-01-19: qty 2

## 2024-01-19 MED ORDER — DEXAMETHASONE SOD PHOSPHATE PF 10 MG/ML IJ SOLN
INTRAMUSCULAR | Status: AC
  Filled 2024-01-19: qty 1

## 2024-01-19 MED ORDER — MIDAZOLAM HCL (PF) 2 MG/2ML IJ SOLN
INTRAMUSCULAR | Status: DC | PRN
  Administered 2024-01-19: 2 mg via INTRAVENOUS

## 2024-01-19 MED ORDER — PROPOFOL 10 MG/ML IV BOLUS
INTRAVENOUS | Status: AC
  Filled 2024-01-19: qty 20

## 2024-01-19 MED ORDER — FENTANYL CITRATE (PF) 50 MCG/ML IJ SOSY: 25.0000 ug | PREFILLED_SYRINGE | INTRAMUSCULAR | Status: DC | PRN

## 2024-01-19 MED ORDER — EPHEDRINE 5 MG/ML INJ
INTRAVENOUS | Status: AC
  Filled 2024-01-19: qty 5

## 2024-01-19 MED ORDER — PROPOFOL 500 MG/50ML IV EMUL
INTRAVENOUS | Status: AC
  Filled 2024-01-19: qty 50

## 2024-01-19 MED ORDER — CEFAZOLIN SODIUM-DEXTROSE 2-4 GM/100ML-% IV SOLN
2.0000 g | INTRAVENOUS | Status: AC
  Administered 2024-01-19: 2 g via INTRAVENOUS

## 2024-01-19 MED ORDER — EPHEDRINE SULFATE (PRESSORS) 25 MG/5ML IV SOSY
PREFILLED_SYRINGE | INTRAVENOUS | Status: DC | PRN
  Administered 2024-01-19: 5 mg via INTRAVENOUS
  Administered 2024-01-19: 10 mg via INTRAVENOUS

## 2024-01-19 MED ORDER — CHLORHEXIDINE GLUCONATE 0.12 % MT SOLN
15.0000 mL | Freq: Once | OROMUCOSAL | Status: AC
  Administered 2024-01-19: 15 mL via OROMUCOSAL

## 2024-01-19 MED ORDER — DEXMEDETOMIDINE HCL IN NACL 80 MCG/20ML IV SOLN
INTRAVENOUS | Status: AC
  Filled 2024-01-19: qty 20

## 2024-01-19 NOTE — Transfer of Care (Signed)
 Immediate Anesthesia Transfer of Care Note  Patient: Paul Williams  Procedure(s) Performed: HYDROCELECTOMY (Bilateral: Scrotum)  Patient Location: PACU  Anesthesia Type:General  Level of Consciousness: awake, alert , oriented, and patient cooperative  Airway & Oxygen Therapy: Patient Spontanous Breathing and Patient connected to face mask oxygen  Post-op Assessment: Report given to RN and Post -op Vital signs reviewed and stable  Post vital signs: Reviewed and stable  Last Vitals:  Vitals Value Taken Time  BP 114/74   Temp 98.4   Pulse 67   Resp 16   SpO2 98     Last Pain:  Vitals:   01/19/24 0642  PainSc: 0-No pain         Complications: No notable events documented.

## 2024-01-19 NOTE — Anesthesia Preprocedure Evaluation (Signed)
"                                    Anesthesia Evaluation  Patient identified by MRN, date of birth, ID band Patient awake    Reviewed: Allergy & Precautions, H&P , NPO status , Patient's Chart, lab work & pertinent test results, reviewed documented beta blocker date and time   Airway Mallampati: II  TM Distance: >3 FB Neck ROM: full    Dental no notable dental hx.    Pulmonary sleep apnea    Pulmonary exam normal breath sounds clear to auscultation       Cardiovascular Exercise Tolerance: Good hypertension,  Rhythm:regular Rate:Normal     Neuro/Psych negative neurological ROS  negative psych ROS   GI/Hepatic negative GI ROS, Neg liver ROS,,,  Endo/Other  diabetes    Renal/GU Renal disease  negative genitourinary   Musculoskeletal   Abdominal   Peds  Hematology negative hematology ROS (+)   Anesthesia Other Findings   Reproductive/Obstetrics negative OB ROS                              Anesthesia Physical Anesthesia Plan  ASA: 3  Anesthesia Plan: General and General LMA   Post-op Pain Management:    Induction:   PONV Risk Score and Plan: Ondansetron   Airway Management Planned:   Additional Equipment:   Intra-op Plan:   Post-operative Plan:   Informed Consent: I have reviewed the patients History and Physical, chart, labs and discussed the procedure including the risks, benefits and alternatives for the proposed anesthesia with the patient or authorized representative who has indicated his/her understanding and acceptance.     Dental Advisory Given  Plan Discussed with: CRNA  Anesthesia Plan Comments:         Anesthesia Quick Evaluation  "

## 2024-01-19 NOTE — Anesthesia Postprocedure Evaluation (Signed)
"   Anesthesia Post Note  Patient: Paul Williams  Procedure(s) Performed: HYDROCELECTOMY (Bilateral: Scrotum)  Anesthesia Type: General Anesthetic complications: no   No notable events documented.   Last Vitals:  Vitals:   01/19/24 0915 01/19/24 0930  BP: 139/82 134/82  Pulse: 84 81  Resp: 17 16  Temp: 37.1 C   SpO2: 93% 95%    Last Pain:  Vitals:   01/19/24 0930  PainSc: 5                  Yvonna PARAS Mavis Gravelle      "

## 2024-01-19 NOTE — H&P (Signed)
 HPI: Paul Williams is a 73yo here for followup for prostate cancer. PSA decreased to 2.3 from 6.5. He had brachytherapy 07/2023. IPSS 9 QOL 2 on no BPH therapy. Urine stream strong. Nocturia 0-1x. He has occasional urinary urgency but no incontinence. 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     PMH:     Past Medical History:  Diagnosis Date   Adenomatous polyp of colon     CKD (chronic kidney disease)     Diabetes mellitus without complication (HCC)     Diverticulosis     Elevated PSA     Hyperlipidemia     Hypertension     Internal hemorrhoids     Prostate cancer (HCC) 05/2023   Sleep apnea            Surgical History:      Past Surgical History:  Procedure Laterality Date   COLONOSCOPY       POLYPECTOMY       PROSTATE BIOPSY       RADIOACTIVE SEED IMPLANT N/A 07/28/2023    Procedure: INSERTION, RADIATION SOURCE, PROSTATE, SPACEOAR;  Surgeon: [name] CROME, MD;  Location: [facility];  Service: Urology;  Laterality: N/A;   top plate of teeth extracted              Home Medications:  Allergies as of 11/18/2023   No Known Allergies         Medication List           Accurate as of November 18, 2023 11:09 AM. If you have any questions, ask your nurse or doctor.              allopurinol  100 MG tablet Commonly known as: ZYLOPRIM  Take 0.5 tablets (50 mg total) by mouth daily.    aspirin EC 81 MG tablet Take 81 mg by mouth in the morning.    atorvastatin  40 MG tablet Commonly known as: LIPITOR Take 1 tablet (40 mg total) by mouth daily.    DropSafe Alcohol Prep 70 % Pads Test BS BID Dx E11.22    fish oil-omega-3 fatty acids 1000 MG capsule Take 1 g by mouth in the morning.    K-Phos 500 MG tablet Generic drug: potassium phosphate (monobasic) Take 500 mg by mouth 2 (two) times daily.    labetalol 200 MG tablet Commonly known as: NORMODYNE Take 200 mg by mouth 2 (two) times daily.    Mitigare  0.6 MG  Caps Generic drug: Colchicine  2 tablets at pain onset, may repeat x1in 1 hour. No more then 3 tablets in 24 hours What changed:  how much to take how to take this when to take this reasons to take this additional instructions    multivitamin tablet Take 1 tablet by mouth in the morning.    sitaGLIPtin  100 MG tablet Commonly known as: Januvia  Take 1 tablet (100 mg total) by mouth daily.    SUPERIOR PROSTATE PO Take 1 capsule by mouth daily. Super Beta Prostate    telmisartan 20 MG tablet Commonly known as: MICARDIS Take 20 mg by mouth in the morning.    True Metrix Air Glucose Meter w/Device Kit Test BS BID Dx E11.22    True Metrix Blood Glucose Test test strip Generic drug: glucose blood Test BS BID Dx E11.22    True Metrix Level 1 Low Soln Use w/ glucose monitor Dx E11.22    TRUEplus Lancets 33G Misc Test BS BID Dx E11.22  Allergies:  Allergies  No Known Allergies     Family History:      Family History  Problem Relation Age of Onset   Stroke Mother     Diabetes Father     Prostate cancer Father          prostate   Diabetes Sister     Diabetes Sister     Colon cancer Neg Hx     Esophageal cancer Neg Hx     Rectal cancer Neg Hx     Stomach cancer Neg Hx     Colon polyps Neg Hx            Social History:  reports that he has never smoked. He has never used smokeless tobacco. He reports that he does not drink alcohol and does not use drugs.   ROS: All other review of systems were reviewed and are negative except what is noted above in HPI   Physical Exam: BP (!) 142/88   Pulse (!) 59   Constitutional:  Alert and oriented, No acute distress. HEENT:  AT, moist mucus membranes.  Trachea midline, no masses. Cardiovascular: No clubbing, cyanosis, or edema. Respiratory: Normal respiratory effort, no increased work of breathing. GI: Abdomen is soft, nontender, nondistended, no abdominal masses GU: No CVA tenderness.  Lymph: No  cervical or inguinal lymphadenopathy. Skin: No rashes, bruises or suspicious lesions. Neurologic: Grossly intact, no focal deficits, moving all 4 extremities. Psychiatric: Normal mood and affect.   Laboratory Data: Recent Labs       Lab Results  Component Value Date    WBC 7.0 09/12/2023    HGB 12.8 (L) 09/12/2023    HCT 39.2 09/12/2023    MCV 89 09/12/2023    PLT 216 09/12/2023        Recent Labs       Lab Results  Component Value Date    CREATININE 1.41 (H) 09/12/2023        Recent Labs       Lab Results  Component Value Date    PSA 2.0 03/28/2014    PSA 2.0 06/15/2013        Recent Labs  No results found for: TESTOSTERONE     Recent Labs       Lab Results  Component Value Date    HGBA1C 6.5 (H) 09/12/2023        Urinalysis Labs (Brief)          Component Value Date/Time    APPEARANCEUR Clear 02/25/2023 0901    GLUCOSEU Negative 02/25/2023 0901    BILIRUBINUR Negative 02/25/2023 0901    PROTEINUR Trace 02/25/2023 0901    NITRITE Negative 02/25/2023 0901    LEUKOCYTESUR Negative 02/25/2023 0901        Recent Labs       Lab Results  Component Value Date    LABMICR 9.1 05/16/2023        Pertinent Imaging:   No results found for this or any previous visit.   No results found for this or any previous visit.   No results found for this or any previous visit.   No results found for this or any previous visit.   Results for orders placed during the hospital encounter of 01/18/17   US  RENAL   Narrative CLINICAL DATA:  74 year old hypertensive diabetic male with chronic kidney disease stage 3. Initial encounter.   EXAM: RENAL / URINARY TRACT ULTRASOUND COMPLETE   COMPARISON:  05/19/2016 CT.   FINDINGS: Right  Kidney:   Length: 10.8 cm. Echogenicity within normal limits. No hydronephrosis. Lower pole 2.6 x 2.8 x 2.7 cm cyst.   Left Kidney:   Length: 11 cm. Echogenicity within normal limits. No mass or hydronephrosis  visualized.   Bladder:   Under distended. Circumferential wall thickening may be related to under distension.   IMPRESSION: No hydronephrosis.   Right lower pole 2.8 cm cyst.   Under distended urinary bladder.     Electronically Signed By: Elspeth Slain M.D. On: 01/18/2017 19:34   No results found for this or any previous visit.   No results found for this or any previous visit.   No results found for this or any previous visit.     Assessment & Plan:     1. Prostate cancer (HCC) (Primary) Followup 3 months with a PSA - Urinalysis, Routine w reflex microscopic   2. Bilateral hydroceles -the risks/benefits/alternatives to bilateral hydrocelectomy was explained tyo the patient and he understands and wishes to proceed with surgery

## 2024-01-19 NOTE — Anesthesia Procedure Notes (Signed)
 Procedure Name: LMA Insertion Date/Time: 01/19/2024 7:40 AM  Performed by: Pheobe Adine CROME, CRNAPre-anesthesia Checklist: Patient identified, Emergency Drugs available, Suction available, Patient being monitored and Timeout performed Patient Re-evaluated:Patient Re-evaluated prior to induction Oxygen Delivery Method: Circle system utilized Preoxygenation: Pre-oxygenation with 100% oxygen Induction Type: IV induction LMA: LMA inserted LMA Size: 4.0 Number of attempts: 1 Placement Confirmation: positive ETCO2, CO2 detector and breath sounds checked- equal and bilateral Tube secured with: Tape Dental Injury: Teeth and Oropharynx as per pre-operative assessment

## 2024-01-19 NOTE — Op Note (Signed)
 Preoperative diagnosis: bilateral Hydroceles  Postoperative diagnosis: Same  Procedure: 1. Excision of bilateral appendix testis 2. bilateral hydrocelectomy  Attending: Belvie Clara, MD  Anesthesia: General  History of blood loss: Minimal  Antibiotics: ancef   Drains: none  Specimens: 1. bilateral hydrocele sacs   Findings: 14cm right hydrocele, 6cm left hydrocele  Indications: Patient is a 74 year old male with a history of bilateral hydroceles that was growing in size and causing him pain with walking.  We discussed the treatment options including observation versus excision after discussing treatment options he proceed with excision.   Procedure in detail: Prior to procedure consent was obtained.  Patient was brought to the operating room and a brief timeout was done to ensure correct patient, correct procedure, correct site.  General anesthesia was administered and patient was placed in supine position.  His genitalia was then prepped and draped in usual sterile fashion.  A 5cm incision was made in the right hemiscrotum.  We dissected down to the tunica and then incised the tunica. A large hydrocele was encountered and was drained. We then excised the hydrocele sac and then over sewed the edge with 2-0 Vicryl in a running fashion. We then excised the right appendix testis. Hemostasis was then obtained with electrocautery.  We then returned the testis to the right hemiscrotum and closed the overlying dartos with 3-0 vicryl in a running fashion. We then turned out attention to the left side. A 3cm incision was made in the left hemiscrotum.  We dissected down to the tunica and then incised the tunica. A medium hydrocele was encountered and was drained. We then excised the hydrocele sac and then over sewed the edge with 2-0 Vicryl in a running fashion. We then excised the left appendix testis. Hemostasis was then obtained with electrocautery.  We then returned the testis to the left  hemiscrotum and closed the overlying dartos with 3-0 vicryl in a running fashion. The skin was then closed with 4-0 monocryl in a running fashion. Dermabond was placed on the incisions.  A dressing was then applied to the incision.  We then placed a scrotal fluff and this then concluded the procedure which was well tolerated by the patient.  Complications: None  Condition: Stable, extubated, transferred to PACU.  Plan: Patient is to be discharged home.  He is to follow up in 2 weeks for wound check.

## 2024-01-20 ENCOUNTER — Encounter (HOSPITAL_COMMUNITY): Payer: Self-pay | Admitting: Urology

## 2024-01-20 LAB — SURGICAL PATHOLOGY

## 2024-02-01 ENCOUNTER — Ambulatory Visit: Admitting: Urology

## 2024-02-01 VITALS — BP 165/94 | HR 105

## 2024-02-01 DIAGNOSIS — N432 Other hydrocele: Secondary | ICD-10-CM

## 2024-02-01 DIAGNOSIS — N433 Hydrocele, unspecified: Secondary | ICD-10-CM

## 2024-02-01 NOTE — Progress Notes (Unsigned)
 "  02/01/2024 1:53 PM   Paul Williams [birth year] [id number]  Referring provider: Dettinger, Fonda LABOR, MD [address] [place],  [place] [address]  Post op hydrocelectomy   HPI: Paul Williams is a 73yo here for followup after bilateral hydrocelectomy. He denies any drainage from his incisions. He noted increased right scrotal swelling 2 days after his hydrocelectomy which is now improving. No scrotal pain   PMH: Past Medical History:  Diagnosis Date   Adenomatous polyp of colon    CKD (chronic kidney disease)    Diabetes mellitus without complication (HCC)    Diverticulosis    Elevated PSA    Hyperlipidemia    Hypertension    Internal hemorrhoids    Prostate cancer (HCC) 05/2023   Sleep apnea     Surgical History: Past Surgical History:  Procedure Laterality Date   COLONOSCOPY     HYDROCELE EXCISION Bilateral 01/19/2024   Procedure: HYDROCELECTOMY;  Surgeon: [name] CROME, MD;  Location: [facility];  Service: Urology;  Laterality: Bilateral;   POLYPECTOMY     PROSTATE BIOPSY     RADIOACTIVE SEED IMPLANT N/A 07/28/2023   Procedure: INSERTION, RADIATION SOURCE, PROSTATE, SPACEOAR;  Surgeon: [name] CROME, MD;  Location: [facility];  Service: Urology;  Laterality: N/A;   top plate of teeth extracted      Home Medications:  Allergies as of 02/01/2024   No Known Allergies      Medication List        Accurate as of February 01, 2024  1:53 PM. If you have any questions, ask your nurse or doctor.          allopurinol  100 MG tablet Commonly known as: ZYLOPRIM  Take 0.5 tablets (50 mg total) by mouth daily.   aspirin EC 81 MG tablet Take 81 mg by mouth in the morning.   atorvastatin  40 MG tablet Commonly known as: LIPITOR Take 1 tablet (40 mg total) by mouth daily.   DropSafe Alcohol Prep 70 % Pads Test BS BID Dx E11.22   fish oil-omega-3 fatty acids 1000 MG capsule Take 1 g by mouth in the morning.   K-Phos 500 MG tablet Generic drug: potassium phosphate  (monobasic) Take 500 mg by mouth 2 (two) times daily.   labetalol 200 MG tablet Commonly known as: NORMODYNE Take 200 mg by mouth 2 (two) times daily.   Mitigare  0.6 MG Caps Generic drug: Colchicine  2 tablets at pain onset, may repeat x1in 1 hour. No more then 3 tablets in 24 hours What changed:  how much to take how to take this when to take this reasons to take this additional instructions   multivitamin tablet Take 1 tablet by mouth in the morning.   oxyCODONE -acetaminophen  5-325 MG tablet Commonly known as: Percocet Take 1 tablet by mouth every 4 (four) hours as needed for severe pain (pain score 7-10).   sitaGLIPtin  100 MG tablet Commonly known as: Januvia  Take 1 tablet (100 mg total) by mouth daily.   SUPERIOR PROSTATE PO Take 1 capsule by mouth daily. Super Beta Prostate   telmisartan 20 MG tablet Commonly known as: MICARDIS Take 20 mg by mouth in the morning.   True Metrix Air Glucose Meter w/Device Kit Test BS BID Dx E11.22   True Metrix Blood Glucose Test test strip Generic drug: glucose blood Test BS BID Dx E11.22   True Metrix Level 1 Low Soln Use w/ glucose monitor Dx E11.22   TRUEplus Lancets 33G Misc Test BS BID Dx E11.22  Allergies: Allergies[1]  Family History: Family History  Problem Relation Age of Onset   Stroke Mother    Diabetes Father    Prostate cancer Father        prostate   Diabetes Sister    Diabetes Sister    Colon cancer Neg Hx    Esophageal cancer Neg Hx    Rectal cancer Neg Hx    Stomach cancer Neg Hx    Colon polyps Neg Hx     Social History:  reports that he has never smoked. He has never used smokeless tobacco. He reports that he does not drink alcohol and does not use drugs.  ROS: All other review of systems were reviewed and are negative except what is noted above in HPI  Physical Exam: BP (!) 163/82   Pulse (!) 105   Constitutional:  Alert and oriented, No acute distress. HEENT: [place] AT, moist  mucus membranes.  Trachea midline, no masses. Cardiovascular: No clubbing, cyanosis, or edema. Respiratory: Normal respiratory effort, no increased work of breathing. GI: Abdomen is soft, nontender, nondistended, no abdominal masses GU: No CVA tenderness.  Lymph: No cervical or inguinal lymphadenopathy. Skin: No rashes, bruises or suspicious lesions. Neurologic: Grossly intact, no focal deficits, moving all 4 extremities. Psychiatric: Normal mood and affect.  Laboratory Data: Lab Results  Component Value Date   WBC 6.1 12/21/2023   HGB 12.7 (L) 12/21/2023   HCT 39.6 12/21/2023   MCV 89 12/21/2023   PLT 223 12/21/2023    Lab Results  Component Value Date   CREATININE 1.37 (H) 12/21/2023    Lab Results  Component Value Date   PSA 2.0 03/28/2014   PSA 2.0 06/15/2013    No results found for: TESTOSTERONE  Lab Results  Component Value Date   HGBA1C 7.0 (H) 12/21/2023    Urinalysis    Component Value Date/Time   APPEARANCEUR Clear 11/18/2023 1125   GLUCOSEU Negative 11/18/2023 1125   BILIRUBINUR Negative 11/18/2023 1125   PROTEINUR Trace 11/18/2023 1125   NITRITE Negative 11/18/2023 1125   LEUKOCYTESUR Negative 11/18/2023 1125    Lab Results  Component Value Date   LABMICR Comment 11/18/2023    Pertinent Imaging:  No results found for this or any previous visit.  No results found for this or any previous visit.  No results found for this or any previous visit.  No results found for this or any previous visit.  Results for orders placed during the hospital encounter of 01/18/17  US  RENAL  Narrative CLINICAL DATA:  74 year old hypertensive diabetic male with chronic kidney disease stage 3. Initial encounter.  EXAM: RENAL / URINARY TRACT ULTRASOUND COMPLETE  COMPARISON:  05/19/2016 CT.  FINDINGS: Right Kidney:  Length: 10.8 cm. Echogenicity within normal limits. No hydronephrosis. Lower pole 2.6 x 2.8 x 2.7 cm cyst.  Left Kidney:  Length:  11 cm. Echogenicity within normal limits. No mass or hydronephrosis visualized.  Bladder:  Under distended. Circumferential wall thickening may be related to under distension.  IMPRESSION: No hydronephrosis.  Right lower pole 2.8 cm cyst.  Under distended urinary bladder.   Electronically Signed By: Elspeth Slain M.D. On: 01/18/2017 19:34  No results found for this or any previous visit.  No results found for this or any previous visit.  No results found for this or any previous visit.   Assessment & Plan:    1. Other hydrocele (Primary) -followup 3 months for a wound check   No follow-ups on file.  Belvie Clara,  MD  [facility] [place]       [1] No Known Allergies  "

## 2024-02-01 NOTE — Progress Notes (Unsigned)
 Patient blood pressure value first attempt is high. A repeat blood pressure check completed.  Does patient have a PCP? Yes Has the patient taken then blood pressure medicine today? No  Patient stated he had a very busy day and his bp meds has a diuretic in it and he decided to wait until he gets home tonight.  Discussed with patient the importance of reviewing their blood pressure with PCP and adhering to their blood medication is the patient is prescribed any. Patient voiced understanding.

## 2024-03-23 ENCOUNTER — Ambulatory Visit: Admitting: Family Medicine

## 2024-04-04 ENCOUNTER — Other Ambulatory Visit

## 2024-04-11 ENCOUNTER — Ambulatory Visit: Admitting: Urology

## 2024-08-01 ENCOUNTER — Ambulatory Visit: Payer: Self-pay
